# Patient Record
Sex: Female | Born: 1980
Health system: Southern US, Community
[De-identification: ages and names within clinical notes are randomized; demographics above are authoritative.]

## PROBLEM LIST (undated history)

## (undated) DIAGNOSIS — F419 Anxiety disorder, unspecified: Secondary | ICD-10-CM

## (undated) DIAGNOSIS — J309 Allergic rhinitis, unspecified: Secondary | ICD-10-CM

## (undated) DIAGNOSIS — G4700 Insomnia, unspecified: Secondary | ICD-10-CM

## (undated) DIAGNOSIS — K589 Irritable bowel syndrome without diarrhea: Secondary | ICD-10-CM

## (undated) DIAGNOSIS — J45909 Unspecified asthma, uncomplicated: Secondary | ICD-10-CM

## (undated) DIAGNOSIS — K219 Gastro-esophageal reflux disease without esophagitis: Secondary | ICD-10-CM

## (undated) DIAGNOSIS — T7819XA Other adverse food reactions, not elsewhere classified, initial encounter: Secondary | ICD-10-CM

## (undated) HISTORY — DX: Insomnia, unspecified: G47.00

## (undated) HISTORY — DX: Gastro-esophageal reflux disease without esophagitis: K21.9

## (undated) HISTORY — DX: Unspecified asthma, uncomplicated: J45.909

## (undated) HISTORY — DX: Anxiety disorder, unspecified: F41.9

## (undated) HISTORY — DX: Irritable bowel syndrome, unspecified: K58.9

## (undated) HISTORY — DX: Allergic rhinitis, unspecified: J30.9

## (undated) HISTORY — DX: Other adverse food reactions, not elsewhere classified, initial encounter: T78.19XA

---

## 2005-02-07 ENCOUNTER — Ambulatory Visit: Payer: Self-pay | Admitting: Internal Medicine

## 2005-03-25 ENCOUNTER — Ambulatory Visit: Payer: Self-pay | Admitting: Internal Medicine

## 2005-04-08 ENCOUNTER — Ambulatory Visit: Payer: Self-pay | Admitting: Internal Medicine

## 2005-04-17 ENCOUNTER — Ambulatory Visit: Payer: Self-pay | Admitting: Internal Medicine

## 2005-06-11 ENCOUNTER — Ambulatory Visit: Payer: Self-pay | Admitting: Internal Medicine

## 2005-09-19 ENCOUNTER — Ambulatory Visit: Payer: Self-pay | Admitting: Family Medicine

## 2005-11-13 ENCOUNTER — Ambulatory Visit: Payer: Self-pay | Admitting: Family Medicine

## 2005-12-20 ENCOUNTER — Ambulatory Visit: Payer: Self-pay | Admitting: Internal Medicine

## 2006-01-17 ENCOUNTER — Ambulatory Visit: Payer: Self-pay | Admitting: Gastroenterology

## 2006-02-26 ENCOUNTER — Ambulatory Visit: Payer: Self-pay | Admitting: Internal Medicine

## 2006-03-03 ENCOUNTER — Ambulatory Visit: Payer: Self-pay | Admitting: Internal Medicine

## 2006-04-14 ENCOUNTER — Encounter: Payer: Self-pay | Admitting: Internal Medicine

## 2006-04-14 DIAGNOSIS — K219 Gastro-esophageal reflux disease without esophagitis: Secondary | ICD-10-CM | POA: Insufficient documentation

## 2006-04-14 DIAGNOSIS — J309 Allergic rhinitis, unspecified: Secondary | ICD-10-CM | POA: Insufficient documentation

## 2006-04-14 DIAGNOSIS — J45909 Unspecified asthma, uncomplicated: Secondary | ICD-10-CM | POA: Insufficient documentation

## 2006-04-21 ENCOUNTER — Ambulatory Visit: Payer: Self-pay | Admitting: Family Medicine

## 2006-04-21 LAB — CONVERTED CEMR LAB: Herpes Simplex Vrs I&II-IgM Ab (EIA): DETECTED — AB

## 2007-01-21 ENCOUNTER — Telehealth (INDEPENDENT_AMBULATORY_CARE_PROVIDER_SITE_OTHER): Payer: Self-pay | Admitting: *Deleted

## 2008-03-15 ENCOUNTER — Emergency Department (HOSPITAL_COMMUNITY): Admission: EM | Admit: 2008-03-15 | Discharge: 2008-03-15 | Payer: Self-pay | Admitting: Emergency Medicine

## 2009-03-14 ENCOUNTER — Other Ambulatory Visit: Admission: RE | Admit: 2009-03-14 | Discharge: 2009-03-14 | Payer: Self-pay | Admitting: Obstetrics and Gynecology

## 2009-03-21 ENCOUNTER — Encounter: Admission: RE | Admit: 2009-03-21 | Discharge: 2009-03-21 | Payer: Self-pay | Admitting: *Deleted

## 2010-06-01 NOTE — Assessment & Plan Note (Signed)
[facility]                         GASTROENTEROLOGY OFFICE NOTE   NAME:Angela Bryant, Angela Bryant                   MRN:          [id number]  DATE:01/17/2006                            DOB:          May 05, 1980    REFERRING PHYSICIAN:  Karie Schwalbe, MD   REASON FOR REFERRAL:  Dr. Alphonsus Sias asked me to evaluate Ms. Angela Bryant in  consultation regarding acid reflux.   HISTORY OF PRESENT ILLNESS:  Angela Bryant is a pleasant 30 year old  woman who has had trouble for 1-2 years with burning in her upper  epigastrium. She says it is there several times a week. She has had 3 or  4 discreet severe attacks, twice at night where the burning would move  up into her chest and she had what sounds like water brash. Twice this  has happened during the day as well. She has been on PPI medicines for  the past year or two. Currently she is taking Protonix on an empty  stomach in the morning and does not eat anything for 2-3 hours at least.  She does believe that the PPIs do help although not completely. She has  not true dysphagia. She has rather chronic nausea that has been going on  for at least a year or so.   REVIEW OF SYSTEMS:  Notable for stable weight and is otherwise  essentially normal and is available on her nursing intake sheet.   PAST MEDICAL HISTORY:  Insomnia, asthma, probable GERD as above.   CURRENT MEDICATIONS:  1. Protonix 40 mg once daily.  2. Albuterol.  3. Advair.   ALLERGIES:  CODEINE.   SOCIAL HISTORY:  She is single, lives by herself, works for [employer].  Smokes once or twice a month, drinks once a week. She drinks 1-2 cups of  caffeine in a week. Eats dinner at 5 p.m., does not usually lay down for  bed until many hours later. Sometimes has a snack but this is at least 2-  3 hours before she lays down for bed. Does not eat chocolates or  peppermints.   FAMILY HISTORY:  No colon cancer or colitis in family.   PHYSICAL EXAMINATION:   VITAL SIGNS:  5 foot 6 inches, 139 pounds, blood  pressure 100/60, pulse 64.  CONSTITUTIONAL:  Generally well-appearing.  NEUROLOGIC:  Alert and oriented x3.  EYES:  Extraocular movements intact.  MOUTH:  Oropharynx moist, no lesions.  NECK:  Supple, no lymphadenopathy.  CARDIOVASCULAR:  Heart regular rate and rhythm.  LUNGS:  Clear to auscultation bilaterally.  ABDOMEN:  Soft, nontender, nondistended, normal bowel sounds.  EXTREMITIES:  No lower extremity edema.  SKIN:  No rashes or lesions on visible extremities.   ASSESSMENT/PLAN:  A 30 year old woman with likely gastroesophageal  reflux disease.   The epigastric burning, pyrosis in her chest and intermittent water  brash are rather classic symptoms for gastroesophageal reflux disease.  She does seem to be helped with proton pump inhibitor although she is  not taking it at the correct time in relation to food. Her symptoms on  proton pump inhibitor taken this way are incompletely treated. I  recommend that she change the way that she is taking Protonix and begin  taking it 20-30 minutes prior to a meal. She eats rather strangely. She  eats almost nothing for breakfast or lunch and has a very large dinner  around 5. I think that probably it is best that she take the Protonix 20-  30 minutes prior to her dinner meal. I will also arrange for her to have  an EGD at her soonest convenience to make sure there is no acid related  changes to her esophagus including Barrett's esophagus. If her symptoms  are not improving while taking proton pump inhibitor at the correct time  in relation to food then may need to consider other possibilities.     Rachael Fee, MD  Electronically Signed    DPJ/MedQ  DD: 01/17/2006  DT: 01/17/2006  Job #: [id number]   cc:   Karie Schwalbe, MD

## 2011-06-20 ENCOUNTER — Telehealth: Payer: Self-pay | Admitting: Gastroenterology

## 2011-06-20 NOTE — Telephone Encounter (Signed)
Pt has been having abd pain and burning.  Pt last seen 2008 and was recommended to have EGD pt did not have this done.  Appt made for 07/02/11 new pt paperwork mailed.

## 2011-07-02 ENCOUNTER — Ambulatory Visit (INDEPENDENT_AMBULATORY_CARE_PROVIDER_SITE_OTHER): Payer: 59 | Admitting: Gastroenterology

## 2011-07-02 ENCOUNTER — Encounter: Payer: Self-pay | Admitting: Gastroenterology

## 2011-07-02 VITALS — BP 98/68 | HR 80 | Ht 66.0 in | Wt 150.2 lb

## 2011-07-02 DIAGNOSIS — K219 Gastro-esophageal reflux disease without esophagitis: Secondary | ICD-10-CM

## 2011-07-02 NOTE — Progress Notes (Signed)
HPI: This is a   pleasant 31 year old woman whom I last saw about 5 years ago. I recommended an EGD for her at that time for her GERD, dyspepsia but she did not go ahead with it.  She has had constant burning, pyrosis.  Takes omeprazole in AM, doesn't eat BF usually.  Also takes zantac in AM, forgets sometimes.  No dypshagia.  Her weight has not changed.  Cannot lose weight.   Smoke cig rarely.  Very rare etoh.  Drinks 1 caffinated be a day.  Not much chocolcate or peppermint.    Not worse at night.  Rare vomiting.  Getting worse in past month  She also has postprandial diarrhea almost every day.  Review of systems: Pertinent positive and negative review of systems were noted in the above HPI section. Complete review of systems was performed and was otherwise normal.    Past Medical History  Diagnosis Date  . Asthma   . GERD (gastroesophageal reflux disease)     History reviewed. No pertinent past surgical history.  Current Outpatient Prescriptions  Medication Sig Dispense Refill  . albuterol (PROVENTIL HFA;VENTOLIN HFA) 108 (90 BASE) MCG/ACT inhaler Inhale 2 puffs into the lungs every 6 (six) hours as needed.      . calcium carbonate (TUMS - DOSED IN MG ELEMENTAL CALCIUM) 500 MG chewable tablet Chew 1 tablet by mouth daily.      Marland Kitchen omeprazole (PRILOSEC OTC) 20 MG tablet Take 20 mg by mouth daily.      . ranitidine (ZANTAC) 150 MG tablet Take 150 mg by mouth 2 (two) times daily.        Allergies as of 07/02/2011 - Review Complete 07/02/2011  Allergen Reaction Noted  . Codeine Nausea And Vomiting 07/02/2011    Family History  Problem Relation Age of Onset  . Hypertension Father     History   Social History  . Marital Status: Single    Spouse Name: N/A    Number of Children: 0  . Years of Education: N/A   Occupational History  . t [employer]    Social History Main Topics  . Smoking status: Current Some Day Smoker    Types: Cigarettes  . Smokeless tobacco: Never Used    . Alcohol Use: Yes     occasional   . Drug Use: No  . Sexually Active: Not on file   Other Topics Concern  . Not on file   Social History Narrative  . No narrative on file       Physical Exam: BP 98/68  Pulse 80  Ht 5\' 6"  (1.676 m)  Wt 150 lb 4 oz (68.153 kg)  BMI 24.25 kg/m2  LMP 06/06/2011 Constitutional: generally well-appearing Psychiatric: alert and oriented x3 Eyes: extraocular movements intact Mouth: oral pharynx moist, no lesions Neck: supple no lymphadenopathy Cardiovascular: heart regular rate and rhythm Lungs: clear to auscultation bilaterally Abdomen: soft, nontender, nondistended, no obvious ascites, no peritoneal signs, normal bowel sounds Extremities: no lower extremity edema bilaterally Skin: no lesions on visible extremities    Assessment and plan: 32 y.o. female with  significant GERD-like symptoms, intermittent postprandial diarrhea  First I recommended an EGD like I have recommended 5 years ago but she was not interested. Instead I'm giving her samples of a proton pump inhibitor and I have instructed her to take these one pill every day shortly before a meal. She will continue on H2 blocker nightly at bedtime. She is going to call to report on her  upper and lower GI symptoms in 3-4 weeks and we will take it from there.

## 2011-07-02 NOTE — Patient Instructions (Addendum)
Samples of PPI given, take it 20-30 min before a meal (such as lunch meal).  Call Dr. Christella Hartigan' to report on your symptoms in 3-4 weeks. Take zantac at bedtime every night.

## 2011-07-03 ENCOUNTER — Telehealth: Payer: Self-pay | Admitting: Gastroenterology

## 2011-07-03 NOTE — Telephone Encounter (Signed)
Cannot give narcotic pain meds without having better idea what is going on.  Recommend tylenol 1gram twice daily or a single alleve once daily.  Can also offer levbid .0.375mg  twice daily antispasm medicine.  Disp 60, 2 refills. Thanks

## 2011-07-03 NOTE — Telephone Encounter (Signed)
Left message on machine to call back  

## 2011-07-03 NOTE — Telephone Encounter (Signed)
Pt is calling for pain medication.  She says she forgot to ask at her appt yesterday.  I looked at the office note and she did not mention any pain and cramping.  I advised her that Dr Christella Hartigan did recommend an EGD that she declined.  I advised I would ask Dr Christella Hartigan but he may not prescribe any pain meds with out following his recommendations.  Please advise

## 2011-07-04 NOTE — Telephone Encounter (Signed)
Left message on machine to call back regarding her question, she was advised on voice mail if she still has questions to call our office

## 2011-07-17 ENCOUNTER — Other Ambulatory Visit: Payer: Self-pay | Admitting: Obstetrics and Gynecology

## 2011-07-17 ENCOUNTER — Other Ambulatory Visit (HOSPITAL_COMMUNITY)
Admission: RE | Admit: 2011-07-17 | Discharge: 2011-07-17 | Disposition: A | Discharge status: Home / Self Care | Payer: 59 | Source: Ambulatory Visit | Attending: Obstetrics and Gynecology | Admitting: Obstetrics and Gynecology

## 2011-07-17 DIAGNOSIS — Z01419 Encounter for gynecological examination (general) (routine) without abnormal findings: Secondary | ICD-10-CM | POA: Insufficient documentation

## 2011-07-17 DIAGNOSIS — N760 Acute vaginitis: Secondary | ICD-10-CM | POA: Insufficient documentation

## 2011-07-17 DIAGNOSIS — Z113 Encounter for screening for infections with a predominantly sexual mode of transmission: Secondary | ICD-10-CM | POA: Insufficient documentation

## 2011-07-29 ENCOUNTER — Telehealth: Payer: Self-pay | Admitting: Gastroenterology

## 2011-07-29 MED ORDER — ESOMEPRAZOLE MAGNESIUM 40 MG PO CPDR: 40.0000 mg | DELAYED_RELEASE_CAPSULE | Freq: Every day | ORAL | Status: DC

## 2011-07-29 NOTE — Telephone Encounter (Signed)
lmom for pt that I ordered Nexium at [facility] on [address].

## 2011-08-01 ENCOUNTER — Telehealth: Payer: Self-pay | Admitting: Gastroenterology

## 2011-08-01 ENCOUNTER — Other Ambulatory Visit: Payer: Self-pay | Admitting: Gastroenterology

## 2011-08-01 NOTE — Telephone Encounter (Signed)
Nexium is excluded from Pt's Rx plan. Dr. Christella Hartigan needs to prescribe a different medication.

## 2011-08-01 NOTE — Telephone Encounter (Signed)
Pt's nexium is requiring a prior auth. Called [facility] to start prior auth

## 2011-08-04 NOTE — Telephone Encounter (Signed)
Can you please call in: pantoprazole 40mg  one pill once daily, 20-30 min prior to BF meal daily, disp 30 with 6 refills.

## 2011-08-05 ENCOUNTER — Other Ambulatory Visit: Payer: Self-pay | Admitting: Gastroenterology

## 2011-08-05 MED ORDER — PANTOPRAZOLE SODIUM 40 MG PO TBEC: 40.0000 mg | DELAYED_RELEASE_TABLET | Freq: Every day | ORAL | Status: DC

## 2011-08-05 NOTE — Addendum Note (Signed)
Addended by: Donata Duff on: 08/05/2011 08:24 AM   Modules accepted: Orders

## 2011-08-08 ENCOUNTER — Other Ambulatory Visit (HOSPITAL_COMMUNITY)
Admission: RE | Admit: 2011-08-08 | Discharge: 2011-08-08 | Disposition: A | Discharge status: Home / Self Care | Payer: 59 | Source: Ambulatory Visit | Attending: Obstetrics and Gynecology | Admitting: Obstetrics and Gynecology

## 2011-08-29 ENCOUNTER — Telehealth: Payer: Self-pay | Admitting: Gastroenterology

## 2011-08-29 NOTE — Telephone Encounter (Signed)
Called pharmacy to see about prior auth for Nexium, according to pt's med list she is on protonix. [facility] said she tried to get Nexium months ago, and her Prior Berkley Harvey was denied and pt was put on Protonix and had that filled on 08/04/2011. Pharmacy has NO record of a new prior auth needed for pt. They will fill her protonix.

## 2012-03-19 ENCOUNTER — Encounter (HOSPITAL_COMMUNITY): Payer: Self-pay | Admitting: Emergency Medicine

## 2012-03-19 ENCOUNTER — Emergency Department (HOSPITAL_COMMUNITY)
Admission: EM | Admit: 2012-03-19 | Discharge: 2012-03-19 | Disposition: A | Discharge status: Home / Self Care | Payer: 59 | Attending: Emergency Medicine | Admitting: Emergency Medicine

## 2012-03-19 DIAGNOSIS — D72829 Elevated white blood cell count, unspecified: Secondary | ICD-10-CM | POA: Insufficient documentation

## 2012-03-19 DIAGNOSIS — Z8719 Personal history of other diseases of the digestive system: Secondary | ICD-10-CM | POA: Insufficient documentation

## 2012-03-19 DIAGNOSIS — E860 Dehydration: Secondary | ICD-10-CM | POA: Insufficient documentation

## 2012-03-19 DIAGNOSIS — R42 Dizziness and giddiness: Secondary | ICD-10-CM | POA: Insufficient documentation

## 2012-03-19 DIAGNOSIS — Z79899 Other long term (current) drug therapy: Secondary | ICD-10-CM | POA: Insufficient documentation

## 2012-03-19 DIAGNOSIS — R112 Nausea with vomiting, unspecified: Secondary | ICD-10-CM | POA: Insufficient documentation

## 2012-03-19 DIAGNOSIS — F17200 Nicotine dependence, unspecified, uncomplicated: Secondary | ICD-10-CM | POA: Insufficient documentation

## 2012-03-19 DIAGNOSIS — R197 Diarrhea, unspecified: Secondary | ICD-10-CM | POA: Insufficient documentation

## 2012-03-19 DIAGNOSIS — R1110 Vomiting, unspecified: Secondary | ICD-10-CM

## 2012-03-19 DIAGNOSIS — J45909 Unspecified asthma, uncomplicated: Secondary | ICD-10-CM | POA: Insufficient documentation

## 2012-03-19 DIAGNOSIS — N390 Urinary tract infection, site not specified: Secondary | ICD-10-CM | POA: Insufficient documentation

## 2012-03-19 LAB — COMPREHENSIVE METABOLIC PANEL
ALT: 14 U/L (ref 0–35)
AST: 18 U/L (ref 0–37)
AST: 34 U/L (ref 0–37)
Alkaline Phosphatase: 62 U/L (ref 39–117)
BUN: 11 mg/dL (ref 6–23)
CO2: 21 mEq/L (ref 19–32)
CO2: 21 mEq/L (ref 19–32)
Calcium: 10.2 mg/dL (ref 8.4–10.5)
Calcium: 9.9 mg/dL (ref 8.4–10.5)
Chloride: 99 mEq/L (ref 96–112)
Creatinine, Ser: 0.73 mg/dL (ref 0.50–1.10)
GFR calc non Af Amer: >90 mL/min (ref >90)
Glucose, Bld: 131 mg/dL — ABNORMAL HIGH (ref 70–99)
Potassium: 4 mEq/L (ref 3.5–5.1)
Potassium: 4.8 mEq/L (ref 3.5–5.1)
Sodium: 135 mEq/L (ref 135–145)
Sodium: 133 mEq/L — ABNORMAL LOW (ref 135–145)
Total Bilirubin: 0.4 mg/dL (ref 0.3–1.2)
Total Protein: 9.2 g/dL — ABNORMAL HIGH (ref 6.0–8.3)

## 2012-03-19 LAB — CBC
HCT: 42.1 % (ref 36.0–46.0)
Hemoglobin: 14.8 g/dL (ref 12.0–15.0)
MCH: 30.6 pg (ref 26.0–34.0)
MCHC: 35.2 g/dL (ref 30.0–36.0)
RBC: 4.84 MIL/uL (ref 3.87–5.11)
RDW: 13.1 % (ref 11.5–15.5)

## 2012-03-19 LAB — URINALYSIS, ROUTINE W REFLEX MICROSCOPIC
Glucose, UA: NEGATIVE mg/dL
Hgb urine dipstick: NEGATIVE
Ketones, ur: >80 mg/dL — AB
Nitrite: NEGATIVE
Protein, ur: 30 mg/dL — AB
Specific Gravity, Urine: 1.034 — ABNORMAL HIGH (ref 1.005–1.030)
Urobilinogen, UA: 0.2 mg/dL (ref 0.0–1.0)
pH: 5.5 (ref 5.0–8.0)

## 2012-03-19 LAB — POCT I-STAT, CHEM 8
Chloride: 108 mEq/L (ref 96–112)
Hemoglobin: 15.6 g/dL — ABNORMAL HIGH (ref 12.0–15.0)
Sodium: 139 mEq/L (ref 135–145)
TCO2: 25 mmol/L (ref 0–100)

## 2012-03-19 LAB — CBC WITH DIFFERENTIAL/PLATELET
Basophils Absolute: 0 10*3/uL (ref 0.0–0.1)
Hemoglobin: 14.8 g/dL (ref 12.0–15.0)
MCH: 31.2 pg (ref 26.0–34.0)
Monocytes Absolute: 0.8 10*3/uL (ref 0.1–1.0)
Monocytes Relative: 4 % (ref 3–12)
Neutro Abs: 17.7 10*3/uL — ABNORMAL HIGH (ref 1.7–7.7)

## 2012-03-19 LAB — URINE MICROSCOPIC-ADD ON

## 2012-03-19 MED ORDER — CEFTRIAXONE SODIUM 1 G IJ SOLR
1.0000 g | Freq: Once | INTRAMUSCULAR | Status: AC
  Administered 2012-03-19: 1 g via INTRAMUSCULAR
  Filled 2012-03-19: qty 10

## 2012-03-19 MED ORDER — CEFPODOXIME PROXETIL 100 MG PO TABS: 100.0000 mg | ORAL_TABLET | Freq: Two times a day (BID) | ORAL | Status: DC

## 2012-03-19 MED ORDER — LIDOCAINE HCL (PF) 1 % IJ SOLN
INTRAMUSCULAR | Status: AC
  Administered 2012-03-19: 2 mL
  Filled 2012-03-19: qty 5

## 2012-03-19 MED ORDER — PROMETHAZINE HCL 25 MG PO TABS: 25.0000 mg | ORAL_TABLET | Freq: Four times a day (QID) | ORAL | Status: DC | PRN

## 2012-03-19 MED ORDER — SODIUM CHLORIDE 0.9 % IV BOLUS (SEPSIS)
1000.0000 mL | Freq: Once | INTRAVENOUS | Status: AC
  Administered 2012-03-19: 1000 mL via INTRAVENOUS

## 2012-03-19 NOTE — ED Notes (Signed)
PT. REPORTS EMESIS AND DIARRHEA WITH UPPER ABDOMINAL PAIN AND  DIZZINESS/ LIGHTHEADEDNESS ONSET YESTERDAY .

## 2012-03-20 LAB — URINE CULTURE: Colony Count: NO GROWTH

## 2012-03-20 NOTE — ED Provider Notes (Signed)
History     CSN: [id number]  Arrival date & time 03/19/12  0032   First MD Initiated Contact with Patient 03/19/12 0214      Chief Complaint  Patient presents with  . Diarrhea  . Emesis    (Consider location/radiation/quality/duration/timing/severity/associated sxs/prior treatment) HPI History provided by patient. Yesterday onset of nausea vomiting diarrhea with associated dizziness and lightheadedness. No fevers. No known sick contacts. No blood in emesis or stools. Has associated intermittent abdominal cramping - no significant pain at this time. No back pain or dysuria. Symptoms moderate in severity. No fevers or chills Past Medical History  Diagnosis Date  . Asthma   . GERD (gastroesophageal reflux disease)     History reviewed. No pertinent past surgical history.  Family History  Problem Relation Age of Onset  . Hypertension Father     History  Substance Use Topics  . Smoking status: Current Some Day Smoker    Types: Cigarettes  . Smokeless tobacco: Never Used  . Alcohol Use: Yes     Comment: occasional     OB History   Grav Para Term Preterm Abortions TAB SAB Ect Mult Living                  Review of Systems  Constitutional: Negative for fever and chills.  HENT: Negative for neck pain and neck stiffness.   Eyes: Negative for pain.  Respiratory: Negative for shortness of breath.   Cardiovascular: Negative for chest pain.  Gastrointestinal: Positive for nausea, vomiting and diarrhea. Negative for blood in stool and abdominal distention.  Genitourinary: Negative for dysuria.  Musculoskeletal: Negative for back pain.  Skin: Negative for rash.  Neurological: Negative for headaches.  All other systems reviewed and are negative.    Allergies  Codeine  Home Medications   Current Outpatient Rx  Name  Route  Sig  Dispense  Refill  . albuterol (PROVENTIL HFA;VENTOLIN HFA) 108 (90 BASE) MCG/ACT inhaler   Inhalation   Inhale 2 puffs into the lungs every  6 (six) hours as needed.         Marland Kitchen ZOLPIDEM TARTRATE PO   Oral   Take by mouth.         . cefpodoxime (VANTIN) 100 MG tablet   Oral   Take 1 tablet (100 mg total) by mouth 2 (two) times daily.   14 tablet   0   . promethazine (PHENERGAN) 25 MG tablet   Oral   Take 1 tablet (25 mg total) by mouth every 6 (six) hours as needed for nausea.   30 tablet   0     BP 121/75  Pulse 104  Temp(Src) 98.1 F (36.7 C) (Oral)  Resp 20  SpO2 100%  LMP 02/27/2012  Physical Exam  Constitutional: She is oriented to person, place, and time. She appears well-developed and well-nourished.  HENT:  Head: Normocephalic and atraumatic.  Eyes: EOM are normal. Pupils are equal, round, and reactive to light.  Neck: Neck supple.  Cardiovascular: Regular rhythm and intact distal pulses.   Tachycardic  Pulmonary/Chest: Effort normal and breath sounds normal. No respiratory distress. She exhibits no tenderness.  Abdominal: Soft. Bowel sounds are normal. She exhibits no distension. There is no tenderness. There is no rebound and no guarding.  No CVA tenderness  Musculoskeletal: Normal range of motion. She exhibits no edema.  Neurological: She is alert and oriented to person, place, and time.  Skin: Skin is warm and dry.    ED Course  Procedures (including critical care time)  Labs Reviewed  CBC WITH DIFFERENTIAL - Abnormal; Notable for the following:    WBC 19.2 (*)    MCHC 36.1 (*)    Neutrophils Relative 92 (*)    Neutro Abs 17.7 (*)    Lymphocytes Relative 3 (*)    Lymphs Abs 0.6 (*)    All other components within normal limits  COMPREHENSIVE METABOLIC PANEL - Abnormal; Notable for the following:    Glucose, Bld 146 (*)    Total Protein 8.8 (*)    All other components within normal limits  URINALYSIS, ROUTINE W REFLEX MICROSCOPIC - Abnormal; Notable for the following:    Color, Urine AMBER (*)    APPearance TURBID (*)    Specific Gravity, Urine 1.034 (*)    Bilirubin Urine SMALL  (*)    Ketones, ur >80 (*)    Protein, ur 30 (*)    Leukocytes, UA SMALL (*)    All other components within normal limits  CBC - Abnormal; Notable for the following:    WBC 19.9 (*)    All other components within normal limits  COMPREHENSIVE METABOLIC PANEL - Abnormal; Notable for the following:    Sodium 133 (*)    Glucose, Bld 131 (*)    Total Protein 9.2 (*)    All other components within normal limits  URINE MICROSCOPIC-ADD ON - Abnormal; Notable for the following:    Squamous Epithelial / LPF MANY (*)    Bacteria, UA MANY (*)    All other components within normal limits  POCT I-STAT, CHEM 8 - Abnormal; Notable for the following:    Potassium 5.4 (*)    Glucose, Bld 136 (*)    Hemoglobin 15.6 (*)    All other components within normal limits  URINE CULTURE     1. Vomiting and diarrhea   2. UTI (lower urinary tract infection)   3. Dehydration   4. Elevated white blood cell count    IV fluids. IV Zofran.  On recheck, patient is feeling better and requesting to be discharged home. She is notified of her elevated white blood cell count and concern for possible serious infection.  She agrees to IM antibiotics and strict return precautions and very much feels she is improved and wants to go home. She tolerates oral fluids and has no significant abdominal pain in the emergency department  MDM  Nausea vomiting diarrhea with leukocytosis, severe dehydration/ tachycardia, and UTI.  Evaluated with urinalysis and labs reviewed as above  Improved with IV fluids and IV Zofran  Vital signs and nursing notes reviewed and considered - patient is reliable historian and states understanding all discharge and followup instructions in addition to provided ED return precautions        Sunnie Nielsen, MD 03/20/12 0510

## 2012-03-31 ENCOUNTER — Other Ambulatory Visit: Payer: Self-pay | Admitting: Neurology

## 2012-04-22 ENCOUNTER — Other Ambulatory Visit: Payer: Self-pay | Admitting: Neurology

## 2012-10-05 ENCOUNTER — Other Ambulatory Visit: Payer: Self-pay | Admitting: Neurology

## 2012-10-05 NOTE — Telephone Encounter (Signed)
Rx signed and faxed.

## 2012-12-21 ENCOUNTER — Telehealth: Payer: Self-pay | Admitting: Gastroenterology

## 2012-12-21 NOTE — Telephone Encounter (Signed)
Pt has been given an appt with Angela Bryant for 12/23/12.  She has a change in bowels and abd pain with rectal bleeding

## 2012-12-23 ENCOUNTER — Ambulatory Visit (INDEPENDENT_AMBULATORY_CARE_PROVIDER_SITE_OTHER): Payer: 59 | Admitting: Physician Assistant

## 2012-12-23 ENCOUNTER — Encounter: Payer: Self-pay | Admitting: Physician Assistant

## 2012-12-23 ENCOUNTER — Other Ambulatory Visit (INDEPENDENT_AMBULATORY_CARE_PROVIDER_SITE_OTHER): Payer: 59

## 2012-12-23 VITALS — BP 102/70 | HR 92 | Ht 65.5 in | Wt 153.4 lb

## 2012-12-23 DIAGNOSIS — R198 Other specified symptoms and signs involving the digestive system and abdomen: Secondary | ICD-10-CM

## 2012-12-23 DIAGNOSIS — R194 Change in bowel habit: Secondary | ICD-10-CM

## 2012-12-23 DIAGNOSIS — K625 Hemorrhage of anus and rectum: Secondary | ICD-10-CM

## 2012-12-23 LAB — CBC WITH DIFFERENTIAL/PLATELET
Basophils Absolute: 0 10*3/uL (ref 0.0–0.1)
Basophils Relative: 0.5 % (ref 0.0–3.0)
Eosinophils Absolute: 0.2 10*3/uL (ref 0.0–0.7)
Eosinophils Relative: 3 % (ref 0.0–5.0)
HCT: 36.8 % (ref 36.0–46.0)
Lymphocytes Relative: 25.5 % (ref 12.0–46.0)
MCHC: 33.5 g/dL (ref 30.0–36.0)
Monocytes Relative: 7.2 % (ref 3.0–12.0)
Neutro Abs: 5.2 10*3/uL (ref 1.4–7.7)
Neutrophils Relative %: 63.8 % (ref 43.0–77.0)
RBC: 4.21 Mil/uL (ref 3.87–5.11)
WBC: 8.1 10*3/uL (ref 4.5–10.5)

## 2012-12-23 LAB — TSH: TSH: 0.85 u[IU]/mL (ref 0.35–5.50)

## 2012-12-23 LAB — HIGH SENSITIVITY CRP: CRP, High Sensitivity: 5.01 mg/L — ABNORMAL HIGH (ref 0.000–5.000)

## 2012-12-23 LAB — SEDIMENTATION RATE: Sed Rate: 27 mm/hr — ABNORMAL HIGH (ref 0–22)

## 2012-12-23 NOTE — Progress Notes (Signed)
Subjective:    Patient ID: Angela Bryant, female    DOB: 05/23/1980, 32 y.o.   MRN: [id number]  HPI Angela Bryant is a pleasant 32 year old Caucasian female known to Dr. Christella Bryant who had been seen previously for GERD. She comes in now with new complaint of constipation over the past 6 months.she states she had had long-term symptoms of postprandial diarrhea for many years and always thought she had IBS. She mentions that her father and her brother have IBS and her half brother also has celiac disease. She says about 6 months ago she started having more problems with constipation. She's not been on any new diet supplements or medications. He says she'll still have episodes of diarrhea where she may have 4-5 bowel movements per day- liquid at times-then go back to constipation and have no bowel movement for for 5 days. He's had some mild lower, cramping intermittently. Her appetite has been fine her weight has been stable. Over the past 4-5 days she did have constipation and had a lot of straining and then passed some bright red blood which she saw on the tissue and in the commode. She says in the past on a couple of occasions she had seen scant amounts of bright red blood which she had attributed to  A hemorrhoid.. She states that she talked to a friend  friend recently who told her about Crohn's disease and now she's worried about possibility of inflammatory bowel disease.   Review of Systems  Constitutional: Negative.   HENT: Negative.   Eyes: Negative.   Respiratory: Negative.   Cardiovascular: Negative.   Gastrointestinal: Positive for abdominal pain, diarrhea, constipation and anal bleeding.  Endocrine: Negative.   Genitourinary: Negative.   Musculoskeletal: Negative.   Allergic/Immunologic: Negative.   Neurological: Negative.   Hematological: Negative.   Psychiatric/Behavioral: Negative.    Outpatient Prescriptions Prior to Visit  Medication Sig Dispense Refill  . albuterol (PROVENTIL  HFA;VENTOLIN HFA) 108 (90 BASE) MCG/ACT inhaler Inhale 2 puffs into the lungs every 6 (six) hours as needed.      . promethazine (PHENERGAN) 25 MG tablet Take 1 tablet (25 mg total) by mouth every 6 (six) hours as needed for nausea.  30 tablet  0  . zolpidem (AMBIEN) 10 MG tablet TAKE ONE TABLET BY MOUTH AT BEDTIME  30 tablet  5  . ZOLPIDEM TARTRATE PO Take by mouth.      . cefpodoxime (VANTIN) 100 MG tablet Take 1 tablet (100 mg total) by mouth 2 (two) times daily.  14 tablet  0   No facility-administered medications prior to visit.       Allergies  Allergen Reactions  . Codeine Nausea And Vomiting   Patient Active Problem List   Diagnosis Date Noted  . ALLERGIC RHINITIS 04/14/2006  . ASTHMA 04/14/2006  . GERD 04/14/2006   History  Substance Use Topics  . Smoking status: Former Smoker    Types: Cigarettes    Quit date: 12/09/2012  . Smokeless tobacco: Never Used  . Alcohol Use: Yes     Comment: occasional   family history includes Hypertension in her father.   Objective:   Physical Exam   And white female in no acute distress, pleasant blood pressure 102/70 pulse 92-5 foot 5 weight 153. HEENT; nontraumatic normocephalic EOMI PERRLA sclera anicteric, Supple; no JVD, Cardiovascular; regular rate and rhythm with S1-S2 no murmur or gallop, Pulmonary; clear bilaterally, Abdomen; soft basically nontender there's no guarding or rebound no palpable mass or hepatosplenomegaly bowel  sounds are present, Rectal; exam on external exam she is very small tag digital exam is negative and stool is Hemoccult negative, Extremities; no clubbing cyanosis or edema skin warm and dry, Psych ;mood and affect normal and appropriate       Assessment & Plan:  #31 32 year old female with long history of intermittent bouts of diarrhea now with constipation alternating with diarrhea x6 months and 2 isolated incidence of small volume hematochezia. I suspect she may have IBS with bleeding secondary to  internal hemorrhoid no evidence of fissure on rectal exam. Also need to consider celiac disease and inflammatory bowel disease . #2 GERD-currently not problematic  Plan; start Benefiber one dose every morning Start probiotic daily. Patient was given samples today of Restora Will check CBC with differential, CRP, and celiac panel as well as TSH Will  followup with Dr. Christella Bryant or myself in one month and is encouraged: The interim if the above regimen is not helping

## 2012-12-23 NOTE — Patient Instructions (Signed)
Please go to the basement level to have your labs drawn.  Take benefiber daily. Take Probiotic daily, Restora over the counter. Samples provided.  We made you a follow up appointment with Dr. Christella Hartigan 02-03-2013 at 9:45 am.

## 2012-12-24 LAB — CELIAC PANEL 10
Gliadin IgA: 7.5 U/mL (ref <20)
Gliadin IgG: 8.5 U/mL (ref <20)
Tissue Transglut Ab: 7.5 U/mL (ref <20)
Tissue Transglutaminase Ab, IgA: 6.9 U/mL (ref <20)

## 2012-12-28 NOTE — Progress Notes (Signed)
i agree with the above workup, plan

## 2013-01-01 ENCOUNTER — Telehealth: Payer: Self-pay | Admitting: Gastroenterology

## 2013-01-04 NOTE — Telephone Encounter (Signed)
Pt has been calling for lab results since 01/01/13. Please advise.

## 2013-01-05 NOTE — Telephone Encounter (Signed)
Please let pt know labs are normal with exception of mildy elevatedinflammatory marker. Celiac blood work negative- make sure she has follow up with jacobs scheduled

## 2013-01-06 NOTE — Telephone Encounter (Signed)
I called the patient and advised her of the results per Amy Esterwood PA-C . I advised the patient she has an apppointment with Dr. Rob Bunting on 02-03-2013 at 9:45 am.  She seemed frustrated and asked about labs for IBS. I told her the labs Amy had drawn were labs done for IBS and other conditions.  I told her to continue the probiotic.  I also gave her suggestions that Amy gave her when she saw her recently.  I told her to call me Monday the 29th and I will check the schedule to see if Dr. Christella Hartigan had any sooner appointment in early Jan. 2015.

## 2013-02-03 ENCOUNTER — Encounter: Payer: Self-pay | Admitting: Gastroenterology

## 2013-02-03 ENCOUNTER — Ambulatory Visit (INDEPENDENT_AMBULATORY_CARE_PROVIDER_SITE_OTHER): Payer: 59 | Admitting: Gastroenterology

## 2013-02-03 VITALS — BP 104/76 | HR 72 | Ht 65.5 in | Wt 153.4 lb

## 2013-02-03 DIAGNOSIS — K625 Hemorrhage of anus and rectum: Secondary | ICD-10-CM

## 2013-02-03 DIAGNOSIS — R109 Unspecified abdominal pain: Secondary | ICD-10-CM

## 2013-02-03 DIAGNOSIS — K5900 Constipation, unspecified: Secondary | ICD-10-CM

## 2013-02-03 MED ORDER — MOVIPREP 100 G PO SOLR: 1.0000 | Freq: Once | ORAL | Status: DC

## 2013-02-03 NOTE — Progress Notes (Signed)
Review of pertinent gastrointestinal problems: 1. Dyspepsia, GERD-like. I have offered upper endoscopy to her twice in the past 5 to 6 years she has declined twice.   HPI: This is a  pleasant 33 year old woman whom I last saw a year or so ago. More recently she saw Mike GipAmy Esterwood here in our office, this was for what sounded like alternating constipation diarrhea, minor rectal bleeding.  Has been very constipated lately.  We gave probiotics, recommended benefiber.  She's been eating   Goes only once every week.   When she goes she has to push and strain quite a lot.  Previously would have loose stools alternating with constipation.  She tried miralax only for three days.  No weight change   Past Medical History  Diagnosis Date  . Asthma   . GERD (gastroesophageal reflux disease)     History reviewed. No pertinent past surgical history.  Current Outpatient Prescriptions  Medication Sig Dispense Refill  . ADVAIR DISKUS 250-50 MCG/DOSE AEPB       . albuterol (PROVENTIL HFA;VENTOLIN HFA) 108 (90 BASE) MCG/ACT inhaler Inhale 2 puffs into the lungs every 6 (six) hours as needed.      . ALPRAZolam (XANAX) 1 MG tablet       . cyclobenzaprine (FLEXERIL) 5 MG tablet       . fluconazole (DIFLUCAN) 150 MG tablet       . promethazine (PHENERGAN) 25 MG tablet Take 1 tablet (25 mg total) by mouth every 6 (six) hours as needed for nausea.  30 tablet  0  . zolpidem (AMBIEN) 10 MG tablet TAKE ONE TABLET BY MOUTH AT BEDTIME  30 tablet  5   No current facility-administered medications for this visit.    Allergies as of 02/03/2013 - Review Complete 02/03/2013  Allergen Reaction Noted  . Codeine Nausea And Vomiting 07/02/2011    Family History  Problem Relation Age of Onset  . Hypertension Father     History   Social History  . Marital Status: Single    Spouse Name: N/A    Number of Children: 0  . 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 Smoking status: Former Smoker    Types: Cigarettes    Quit date: 12/09/2012  . Smokeless tobacco: Never Used  . Alcohol Use: Yes     Comment: occasional   . Drug Use: No  . Sexual Activity: Not on file   Other Topics Concern  . Not on file   Social History Narrative  . No narrative on file      Physical Exam: BP 104/76  Pulse 72  Ht 5' 5.5" (1.664 m)  Wt 153 lb 6.4 oz (69.582 kg)  BMI 25.13 kg/m2  LMP 02/03/2013 Constitutional: generally well-appearing Psychiatric: alert and oriented x3 Abdomen: soft, nontender, nondistended, no obvious ascites, no peritoneal signs, normal bowel sounds     Assessment and plan: 33 y.o. female with significant change in her bowels recently, constipation, minor rectal bleeding  I did not mention above that she did have labs done last month and these showed elevated inflammatory markers including an elevated sedimentation rate and elevated CRP. I recommended she start taking daily MiraLax instead of as needed. I also recommended we proceed with colonoscopy to workup her change in bowels, minor rectal bleeding.

## 2013-02-03 NOTE — Patient Instructions (Signed)
Start once daily miralax, one dose every day. You will be set up for a colonoscopy (LEC, MAC sedation).

## 2013-02-13 ENCOUNTER — Other Ambulatory Visit: Payer: Self-pay | Admitting: Neurology

## 2013-03-01 ENCOUNTER — Encounter: Payer: Self-pay | Admitting: Gastroenterology

## 2013-03-01 ENCOUNTER — Ambulatory Visit (AMBULATORY_SURGERY_CENTER): Payer: 59 | Admitting: Gastroenterology

## 2013-03-01 VITALS — BP 111/70 | HR 71 | Temp 98.9°F | Resp 27 | Ht 65.0 in | Wt 153.0 lb

## 2013-03-01 DIAGNOSIS — K5900 Constipation, unspecified: Secondary | ICD-10-CM

## 2013-03-01 DIAGNOSIS — R109 Unspecified abdominal pain: Secondary | ICD-10-CM

## 2013-03-01 MED ORDER — SODIUM CHLORIDE 0.9 % IV SOLN: 500.0000 mL | INTRAVENOUS | Status: DC

## 2013-03-01 NOTE — Op Note (Signed)
[facility] [address] [place], [id number]   COLONOSCOPY PROCEDURE REPORT  PATIENT: Angela Bryant, Angela Bryant  MR#: [id number] BIRTHDATE: [birth year] , 33  yrs. old GENDER: Female ENDOSCOPIST: Rachael Feeaniel P Lanitra Battaglini, MD PROCEDURE DATE:  03/01/2013 PROCEDURE:   Colonoscopy, diagnostic First Screening Colonoscopy - Avg.  risk and is 50 yrs.  old or older - No.  Prior Negative Screening - Now for repeat screening. N/A  History of Adenoma - Now for follow-up colonoscopy & has been > or = to 3 yrs.  N/A  Polyps Removed Today? No.  Recommend repeat exam, <10 yrs? No. ASA CLASS:   Class I INDICATIONS:change in bowels, constipation, minor rectal bleeding.  MEDICATIONS: propofol (Diprivan) 400mg  IV  DESCRIPTION OF PROCEDURE:   After the risks benefits and alternatives of the procedure were thoroughly explained, informed consent was obtained.  A digital rectal exam revealed no abnormalities of the rectum.   The LB PFC-H190 [id number]  endoscope was introduced through the anus and advanced to the terminal ileum which was intubated for a short distance. No adverse events experienced.   The quality of the prep was excellent.  The instrument was then slowly withdrawn as the colon was fully examined.   COLON FINDINGS: The mucosa appeared normal in the terminal ileum. A normal appearing cecum, ileocecal valve, and appendiceal orifice were identified.  The ascending, hepatic flexure, transverse, splenic flexure, descending, sigmoid colon and rectum appeared unremarkable.  No polyps or cancers were seen.  Retroflexed views revealed no abnormalities. The time to cecum=4 minutes 00 seconds. Withdrawal time=5 minutes 31 seconds.  The scope was withdrawn and the procedure completed. COMPLICATIONS: There were no complications.  ENDOSCOPIC IMPRESSION: Normal mucosa in the terminal ileum Normal colon You minor intermittent bleeding is likely from  intermittent hemorrhoids.  RECOMMENDATIONS: Trial of (low dose) Linzess for your constipation.  Please call Dr. Christella HartiganJacobs' office in 7-10 days to report on your response.  eSigned:  Rachael Feeaniel P Lorena Clearman, MD 03/01/2013 3:32 PM

## 2013-03-01 NOTE — Progress Notes (Signed)
Procedure ends, to recovery, report given and VSS. 

## 2013-03-01 NOTE — Patient Instructions (Addendum)
YOU HAD AN ENDOSCOPIC PROCEDURE TODAY AT [facility]: Refer to the procedure report that was given to you for any specific questions about what was found during the examination.  If the procedure report does not answer your questions, please call your gastroenterologist to clarify.  If you requested that your care partner not be given the details of your procedure findings, then the procedure report has been included in a sealed envelope for you to review at your convenience later.  YOU SHOULD EXPECT: Some feelings of bloating in the abdomen. Passage of more gas than usual.  Walking can help get rid of the air that was put into your GI tract during the procedure and reduce the bloating. If you had a lower endoscopy (such as a colonoscopy or flexible sigmoidoscopy) you may notice spotting of blood in your stool or on the toilet paper. If you underwent a bowel prep for your procedure, then you may not have a normal bowel movement for a few days.  DIET: Your first meal following the procedure should be a light meal and then it is ok to progress to your normal diet.  A half-sandwich or bowl of soup is an example of a good first meal.  Heavy or fried foods are harder to digest and may make you feel nauseous or bloated.  Likewise meals heavy in dairy and vegetables can cause extra gas to form and this can also increase the bloating.  Drink plenty of fluids but you should avoid alcoholic beverages for 24 hours.  ACTIVITY: Your care partner should take you home directly after the procedure.  You should plan to take it easy, moving slowly for the rest of the day.  You can resume normal activity the day after the procedure however you should NOT DRIVE or use heavy machinery for 24 hours (because of the sedation medicines used during the test).    SYMPTOMS TO REPORT IMMEDIATELY: A gastroenterologist can be reached at any hour.  During normal business hours, 8:30 AM to 5:00 PM Monday through Friday,  call [phone].  After hours and on weekends, please call the GI answering service at [phone] who will take a message and have the physician on call contact you.   Following lower endoscopy (colonoscopy or flexible sigmoidoscopy):  Excessive amounts of blood in the stool  Significant tenderness or worsening of abdominal pains  Swelling of the abdomen that is new, acute  Fever of 100F or higher   FOLLOW UP: If any biopsies were taken you will be contacted by phone or by letter within the next 1-3 weeks.  Call your gastroenterologist if you have not heard about the biopsies in 3 weeks.  Our staff will call the home number listed on your records the next business day following your procedure to check on you and address any questions or concerns that you may have at that time regarding the information given to you following your procedure. This is a courtesy call and so if there is no answer at the home number and we have not heard from you through the emergency physician on call, we will assume that you have returned to your regular daily activities without incident.  SIGNATURES/CONFIDENTIALITY: You and/or your care partner have signed paperwork which will be entered into your electronic medical record.  These signatures attest to the fact that that the information above on your After Visit Summary has been reviewed and is understood.  Full responsibility of the confidentiality of  this discharge information lies with you and/or your care-partner.  Normal exam  Trial of linzess ( low dose) for your constipation. Please call Dr Larae GroomsJacob's office at [phone] to report on your response.in 7 10 days.  Samples provided for now

## 2013-03-01 NOTE — Progress Notes (Signed)
Patient did not experience any of the following events: a burn prior to discharge; a fall within the facility; wrong site/side/patient/procedure/implant event; or a hospital transfer or hospital admission upon discharge from the facility. (G8907) Patient did not have preoperative order for IV antibiotic SSI prophylaxis. (G8918)  

## 2013-03-01 NOTE — Progress Notes (Signed)
Pt.is menstruating-tampon in place.

## 2013-03-02 ENCOUNTER — Encounter: Payer: 59 | Admitting: Gastroenterology

## 2013-03-02 ENCOUNTER — Telehealth: Payer: Self-pay | Admitting: *Deleted

## 2013-03-02 NOTE — Telephone Encounter (Signed)
  Follow up Call-  Call back number 03/01/2013  Post procedure Call Back phone  # [phone]  Permission to leave phone message Yes     Patient questions:  Do you have a fever, pain , or abdominal swelling? no Pain Score  0 *  Have you tolerated food without any problems? yes  Have you been able to return to your normal activities? yes  Do you have any questions about your discharge instructions: Diet   no Medications  no Follow up visit  no  Do you have questions or concerns about your Care? no  Actions: * If pain score is 4 or above: No action needed, pain <4.

## 2013-04-12 ENCOUNTER — Ambulatory Visit: Payer: 59 | Admitting: Family Medicine

## 2013-04-14 ENCOUNTER — Other Ambulatory Visit: Payer: Self-pay | Admitting: Neurology

## 2013-05-24 ENCOUNTER — Ambulatory Visit: Payer: 59 | Admitting: Family Medicine

## 2013-06-04 ENCOUNTER — Telehealth: Payer: Self-pay

## 2013-06-04 NOTE — Telephone Encounter (Signed)
Medication and allergies:  Reviewed and updated  90 day supply/mail order: n/a Local pharmacy:  [facility] - [place], [place] - [address]   Immunizations due:  UTD   A/P: No changes to personal, family history or past surgical hx PAP- 07/27/11-negative Tdap- 02/07/05  To Discuss with Provider: Nothing at this time.

## 2013-06-08 ENCOUNTER — Ambulatory Visit (INDEPENDENT_AMBULATORY_CARE_PROVIDER_SITE_OTHER): Payer: 59 | Admitting: Family Medicine

## 2013-06-08 ENCOUNTER — Telehealth: Payer: Self-pay | Admitting: Gastroenterology

## 2013-06-08 ENCOUNTER — Encounter: Payer: Self-pay | Admitting: Family Medicine

## 2013-06-08 VITALS — BP 112/78 | HR 77 | Temp 98.4°F | Ht 66.0 in | Wt 149.6 lb

## 2013-06-08 DIAGNOSIS — G4700 Insomnia, unspecified: Secondary | ICD-10-CM

## 2013-06-08 DIAGNOSIS — R51 Headache: Secondary | ICD-10-CM

## 2013-06-08 DIAGNOSIS — T23029A Burn of unspecified degree of unspecified single finger (nail) except thumb, initial encounter: Secondary | ICD-10-CM

## 2013-06-08 DIAGNOSIS — R519 Headache, unspecified: Secondary | ICD-10-CM

## 2013-06-08 MED ORDER — SILVER SULFADIAZINE 1 % EX CREA: 1.0000 "application " | TOPICAL_CREAM | Freq: Every day | CUTANEOUS | Status: DC

## 2013-06-08 MED ORDER — CYCLOBENZAPRINE HCL 5 MG PO TABS: 5.0000 mg | ORAL_TABLET | Freq: Three times a day (TID) | ORAL | Status: DC | PRN

## 2013-06-08 MED ORDER — LINACLOTIDE 145 MCG PO CAPS: 145.0000 ug | ORAL_CAPSULE | Freq: Every day | ORAL | Status: DC

## 2013-06-08 NOTE — Patient Instructions (Signed)
Migraine Headache A migraine headache is an intense, throbbing pain on one or both sides of your head. A migraine can last for 30 minutes to several hours. CAUSES  The exact cause of a migraine headache is not always known. However, a migraine may be caused when nerves in the brain become irritated and release chemicals that cause inflammation. This causes pain. Certain things may also trigger migraines, such as:  Alcohol.  Smoking.  Stress.  Menstruation.  Aged cheeses.  Foods or drinks that contain nitrates, glutamate, aspartame, or tyramine.  Lack of sleep.  Chocolate.  Caffeine.  Hunger.  Physical exertion.  Fatigue.  Medicines used to treat chest pain (nitroglycerine), birth control pills, estrogen, and some blood pressure medicines. SIGNS AND SYMPTOMS  Pain on one or both sides of your head.  Pulsating or throbbing pain.  Severe pain that prevents daily activities.  Pain that is aggravated by any physical activity.  Nausea, vomiting, or both.  Dizziness.  Pain with exposure to bright lights, loud noises, or activity.  General sensitivity to bright lights, loud noises, or smells. Before you get a migraine, you may get warning signs that a migraine is coming (aura). An aura may include:  Seeing flashing lights.  Seeing bright spots, halos, or zig-zag lines.  Having tunnel vision or blurred vision.  Having feelings of numbness or tingling.  Having trouble talking.  Having muscle weakness. DIAGNOSIS  A migraine headache is often diagnosed based on:  Symptoms.  Physical exam.  A CT scan or MRI of your head. These imaging tests cannot diagnose migraines, but they can help rule out other causes of headaches. TREATMENT Medicines may be given for pain and nausea. Medicines can also be given to help prevent recurrent migraines.  HOME CARE INSTRUCTIONS  Only take over-the-counter or prescription medicines for pain or discomfort as directed by your  health care provider. The use of long-term narcotics is not recommended.  Lie down in a dark, quiet room when you have a migraine.  Keep a journal to find out what may trigger your migraine headaches. For example, write down:  What you eat and drink.  How much sleep you get.  Any change to your diet or medicines.  Limit alcohol consumption.  Quit smoking if you smoke.  Get 7 9 hours of sleep, or as recommended by your health care provider.  Limit stress.  Keep lights dim if bright lights bother you and make your migraines worse. SEEK IMMEDIATE MEDICAL CARE IF:   Your migraine becomes severe.  You have a fever.  You have a stiff neck.  You have vision loss.  You have muscular weakness or loss of muscle control.  You start losing your balance or have trouble walking.  You feel faint or pass out.  You have severe symptoms that are different from your first symptoms. MAKE SURE YOU:   Understand these instructions.  Will watch your condition.  Will get help right away if you are not doing well or get worse. Document Released: 12/31/2004 Document Revised: 10/21/2012 Document Reviewed: 09/07/2012 ExitCare Patient Information 2014 [employer].  

## 2013-06-08 NOTE — Progress Notes (Signed)
  Subjective:    Angela Bryant is a 33 y.o. female who presents for evaluation of headache. Symptoms began about 6 months ago. Generally, the headaches last about several hours and occur several times per week. The headaches do not seem to be related to any time of the day. The headaches are usually mild and ache and are located in back of head and top of head.  The patient rates her most severe headaches a 7 on a scale from 1 to 10. Recently, the headaches have been increasing in frequency. Work attendance or other daily activities are not affected by the headaches. Precipitating factors include: none which have been determined. The headaches are usually not preceded by an aura. Associated neurologic symptoms: none. The patient denies decreased physical activity, depression, dizziness, loss of balance, muscle weakness, numbness of extremities, speech difficulties, vision problems, vomiting in the early morning and worsening school/work performance. Home treatment has included acetaminophen and ibuprofen with little improvement. Other history includes: tension headaches diagnosed in the past. Family history includes no known family members with significant headaches.  The following portions of the patient's history were reviewed and updated as appropriate:  She  has a past medical history of Asthma and GERD (gastroesophageal reflux disease). She  does not have any pertinent problems on file. She  has no past surgical history on file. Her family history includes Hypertension in her father. She  reports that she has been smoking Cigarettes.  She has been smoking about 0.00 packs per day. She has never used smokeless tobacco. She reports that she drinks alcohol. She reports that she does not use illicit drugs. She has a current medication list which includes the following prescription(s): advair diskus, albuterol, epipen 2-pak, levocetirizine, linaclotide, montelukast, sertraline, and zolpidem. Current  Outpatient Prescriptions on File Prior to Visit  Medication Sig Dispense Refill  . ADVAIR DISKUS 250-50 MCG/DOSE AEPB       . albuterol (PROVENTIL HFA;VENTOLIN HFA) 108 (90 BASE) MCG/ACT inhaler Inhale 2 puffs into the lungs every 6 (six) hours as needed.      . zolpidem (AMBIEN) 10 MG tablet TAKE ONE TABLET BY MOUTH AT BEDTIME  30 tablet  5   No current facility-administered medications on file prior to visit.   She is allergic to codeine..  Review of Systems Pertinent items are noted in HPI.    Objective:    BP 112/78  Pulse 77  Temp(Src) 98.4 F (36.9 C) (Oral)  Ht 5\' 6"  (1.676 m)  Wt 149 lb 9.6 oz (67.858 kg)  BMI 24.16 kg/m2  SpO2 98%  LMP 06/04/2013 Eyes: conjunctivae/corneas clear. PERRL, EOM's intact. Fundi benign. Ears: normal TM's and external ear canals both ears Nose: Nares normal. Septum midline. Mucosa normal. No drainage or sinus tenderness. Throat: lips, mucosa, and tongue normal; teeth and gums normal Neck: no adenopathy, supple, symmetrical, trachea midline and thyroid not enlarged, symmetric, no tenderness/mass/nodules Lungs: clear to auscultation bilaterally Heart: regular rate and rhythm, S1, S2 normal, no murmur, click, rub or gallop Skin: mild burn r index finger    Assessment:    tension headache    Plan:    Lie in darkened room and apply cold packs as needed for pain. Side effect profile discussed in detail. Asked to keep headache diary. Patient reassured that neurodiagnostic workup not indicated from benign H&P. muscle relaxer and chiropractor

## 2013-06-08 NOTE — Telephone Encounter (Signed)
linzess has been sent to the pharmacy as requested

## 2013-06-08 NOTE — Progress Notes (Signed)
Pre visit review using our clinic review tool, if applicable. No additional management support is needed unless otherwise documented below in the visit note. 

## 2013-06-10 DIAGNOSIS — K5904 Chronic idiopathic constipation: Secondary | ICD-10-CM | POA: Insufficient documentation

## 2013-06-30 ENCOUNTER — Telehealth: Payer: Self-pay | Admitting: Family Medicine

## 2013-06-30 NOTE — Telephone Encounter (Signed)
Pt called in stating that she was having a reaction to medication and was dizzy, having blurry vision, and about to pass out.  Routed to CAN and left with live person for triage but was on hold for 4 minutes.  Routed to our RN for triage.

## 2013-06-30 NOTE — Telephone Encounter (Signed)
Spoke to patient who says that she took Belsomra last night for the first time and started to feel weird after taking it.  She was unable to sleep.  Felt bad this morning, but went to work.  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  She has also been nauseated all day, but assumed that this was because she did not sleep last night.  She has eaten today, but still does not feel well.  We currently do not have any appointments, therefore patient was encouraged to have someone drive her to an UC to be seen.  Pt agreed with plan and stated that she would.

## 2013-07-27 ENCOUNTER — Other Ambulatory Visit: Payer: Self-pay | Admitting: Family Medicine

## 2013-08-24 ENCOUNTER — Other Ambulatory Visit: Payer: Self-pay | Admitting: Family Medicine

## 2013-09-30 ENCOUNTER — Other Ambulatory Visit: Payer: Self-pay

## 2013-09-30 MED ORDER — CYCLOBENZAPRINE HCL 5 MG PO TABS: ORAL_TABLET | ORAL | Status: DC

## 2013-11-02 ENCOUNTER — Other Ambulatory Visit: Payer: Self-pay | Admitting: Obstetrics and Gynecology

## 2013-11-02 ENCOUNTER — Other Ambulatory Visit (HOSPITAL_COMMUNITY)
Admission: RE | Admit: 2013-11-02 | Discharge: 2013-11-02 | Disposition: A | Discharge status: Home / Self Care | Payer: 59 | Source: Ambulatory Visit | Attending: Obstetrics and Gynecology | Admitting: Obstetrics and Gynecology

## 2013-11-02 DIAGNOSIS — Z01419 Encounter for gynecological examination (general) (routine) without abnormal findings: Secondary | ICD-10-CM | POA: Diagnosis present

## 2013-11-02 DIAGNOSIS — Z1151 Encounter for screening for human papillomavirus (HPV): Secondary | ICD-10-CM | POA: Diagnosis present

## 2013-11-05 LAB — CYTOLOGY - PAP

## 2013-12-06 ENCOUNTER — Other Ambulatory Visit: Payer: Self-pay | Admitting: Family Medicine

## 2013-12-06 MED ORDER — CYCLOBENZAPRINE HCL 5 MG PO TABS: ORAL_TABLET | ORAL | Status: DC

## 2013-12-06 NOTE — Telephone Encounter (Signed)
Last seen 06/08/13 and filled 09/30/13 #30   Please advise      KP

## 2013-12-06 NOTE — Telephone Encounter (Signed)
Caller name:  Desiray Relation to pt: self Call back number: [phone] [facility]acy: [facility] [address]  Reason for call:   Patient requesting a refill of flexeril

## 2013-12-20 ENCOUNTER — Other Ambulatory Visit: Payer: Self-pay | Admitting: Family Medicine

## 2013-12-20 ENCOUNTER — Telehealth: Payer: Self-pay | Admitting: Family Medicine

## 2013-12-20 DIAGNOSIS — M62838 Other muscle spasm: Secondary | ICD-10-CM

## 2013-12-20 MED ORDER — CYCLOBENZAPRINE HCL 5 MG PO TABS: ORAL_TABLET | ORAL | Status: DC

## 2013-12-20 NOTE — Telephone Encounter (Signed)
sent 

## 2013-12-20 NOTE — Telephone Encounter (Signed)
cyclobenzoprene 5 mg  Patient called on 11-23 asking for a refill.  Looks like the request never went to Dr Laury AxonLowne.  [facility] [address]  \

## 2013-12-21 ENCOUNTER — Ambulatory Visit (INDEPENDENT_AMBULATORY_CARE_PROVIDER_SITE_OTHER): Payer: 59 | Admitting: Family Medicine

## 2013-12-21 ENCOUNTER — Telehealth: Payer: Self-pay | Admitting: Family Medicine

## 2013-12-21 ENCOUNTER — Other Ambulatory Visit: Payer: Self-pay | Admitting: Family Medicine

## 2013-12-21 ENCOUNTER — Encounter: Payer: Self-pay | Admitting: Family Medicine

## 2013-12-21 VITALS — BP 116/77 | HR 80 | Temp 98.3°F | Wt 161.8 lb

## 2013-12-21 DIAGNOSIS — M62838 Other muscle spasm: Secondary | ICD-10-CM

## 2013-12-21 DIAGNOSIS — Z8669 Personal history of other diseases of the nervous system and sense organs: Secondary | ICD-10-CM

## 2013-12-21 DIAGNOSIS — R110 Nausea: Secondary | ICD-10-CM

## 2013-12-21 LAB — CBC WITH DIFFERENTIAL/PLATELET
BASOS ABS: 0 10*3/uL (ref 0.0–0.1)
BASOS PCT: 0.4 % (ref 0.0–3.0)
EOS ABS: 0.2 10*3/uL (ref 0.0–0.7)
Eosinophils Relative: 2.7 % (ref 0.0–5.0)
HCT: 36.4 % (ref 36.0–46.0)
Hemoglobin: 11.8 g/dL — ABNORMAL LOW (ref 12.0–15.0)
LYMPHS PCT: 26.5 % (ref 12.0–46.0)
Lymphs Abs: 2.1 10*3/uL (ref 0.7–4.0)
MCHC: 32.3 g/dL (ref 30.0–36.0)
MCV: 90.7 fl (ref 78.0–100.0)
MONO ABS: 0.6 10*3/uL (ref 0.1–1.0)
Monocytes Relative: 7.3 % (ref 3.0–12.0)
NEUTROS PCT: 63.1 % (ref 43.0–77.0)
Neutro Abs: 4.9 10*3/uL (ref 1.4–7.7)
PLATELETS: 219 10*3/uL (ref 150.0–400.0)
RBC: 4.02 Mil/uL (ref 3.87–5.11)
RDW: 13.7 % (ref 11.5–15.5)
WBC: 7.8 10*3/uL (ref 4.0–10.5)

## 2013-12-21 LAB — HEPATIC FUNCTION PANEL
ALK PHOS: 39 U/L (ref 39–117)
ALT: 14 U/L (ref 0–35)
AST: 17 U/L (ref 0–37)
Albumin: 3.9 g/dL (ref 3.5–5.2)
BILIRUBIN DIRECT: 0 mg/dL (ref 0.0–0.3)
BILIRUBIN TOTAL: 0.3 mg/dL (ref 0.2–1.2)
TOTAL PROTEIN: 7.1 g/dL (ref 6.0–8.3)

## 2013-12-21 LAB — POCT URINALYSIS DIPSTICK
Bilirubin, UA: NEGATIVE
GLUCOSE UA: NEGATIVE
Ketones, UA: NEGATIVE
Leukocytes, UA: NEGATIVE
NITRITE UA: NEGATIVE
PROTEIN UA: NEGATIVE
RBC UA: NEGATIVE
SPEC GRAV UA: 1.015
UROBILINOGEN UA: 0.2
pH, UA: 6.5

## 2013-12-21 LAB — BASIC METABOLIC PANEL
BUN: 9 mg/dL (ref 6–23)
CALCIUM: 9.1 mg/dL (ref 8.4–10.5)
CHLORIDE: 105 meq/L (ref 96–112)
CO2: 25 meq/L (ref 19–32)
CREATININE: 0.7 mg/dL (ref 0.4–1.2)
GFR: 97.69 mL/min (ref >60.00)
GLUCOSE: 89 mg/dL (ref 70–99)
Potassium: 4.4 mEq/L (ref 3.5–5.1)
Sodium: 137 mEq/L (ref 135–145)

## 2013-12-21 LAB — HCG, QUANTITATIVE, PREGNANCY: Quantitative HCG: 0.51 m[IU]/mL

## 2013-12-21 LAB — POCT URINE PREGNANCY: Preg Test, Ur: NEGATIVE

## 2013-12-21 LAB — SEDIMENTATION RATE: SED RATE: 117 mm/h — AB (ref 0–22)

## 2013-12-21 MED ORDER — CYCLOBENZAPRINE HCL 10 MG PO TABS: 10.0000 mg | ORAL_TABLET | Freq: Three times a day (TID) | ORAL | Status: DC | PRN

## 2013-12-21 NOTE — Progress Notes (Signed)
  Subjective:    Angela Bryant is a 33 y.o. female who presents for follow-up of mixed tension and migraine headaches. Home treatment has included tylenol and flexeril  with some improvement. Headaches are occurring every day. Generally, the headaches last about 4 hours. Work attendance or other daily activities are not affected by the headaches. The patient denies decreased physical activity.  The following portions of the patient's history were reviewed and updated as appropriate:  She  has a past medical history of Asthma and GERD (gastroesophageal reflux disease). She  does not have any pertinent problems on file. She  has no past surgical history on file. Her family history includes Hypertension in her father. She  reports that she has been smoking Cigarettes.  She has been smoking about 0.00 packs per day. She has never used smokeless tobacco. She reports that she drinks alcohol. She reports that she does not use illicit drugs. She has a current medication list which includes the following prescription(s): advair diskus, albuterol, epipen 2-pak, levocetirizine, montelukast, and zolpidem. Current Outpatient Prescriptions on File Prior to Visit  Medication Sig Dispense Refill  . ADVAIR DISKUS 250-50 MCG/DOSE AEPB     . albuterol (PROVENTIL HFA;VENTOLIN HFA) 108 (90 BASE) MCG/ACT inhaler Inhale 2 puffs into the lungs every 6 (six) hours as needed.    Marland Kitchen EPIPEN 2-PAK 0.3 MG/0.3ML SOAJ injection     . levocetirizine (XYZAL) 5 MG tablet Take 1 tablet by mouth at bedtime.    . montelukast (SINGULAIR) 10 MG tablet Take 1 tablet by mouth at bedtime.    Marland Kitchen zolpidem (AMBIEN) 10 MG tablet TAKE ONE TABLET BY MOUTH AT BEDTIME 30 tablet 5   No current facility-administered medications on file prior to visit.   She is allergic to codeine..  Review of Systems Pertinent items are noted in HPI.    Objective:    BP 116/77 mmHg  Pulse 80  Temp(Src) 98.3 F (36.8 C) (Oral)  Wt 161 lb 12.8 oz  (73.392 kg)  SpO2 99% General appearance: alert, cooperative, appears stated age and no distress Head: Normocephalic, without obvious abnormality, atraumatic Eyes: conjunctivae/corneas clear. PERRL, EOM's intact. Fundi benign. Neck: no adenopathy, no carotid bruit, no JVD, supple, symmetrical, trachea midline and thyroid not enlarged, symmetric, no tenderness/mass/nodules Lungs: clear to auscultation bilaterally Heart: S1, S2 normal Neurologic: Alert and oriented X 3, normal strength and tone. Normal symmetric reflexes. Normal coordination and gait    Assessment:    hx migraines worsening    Plan:    Continue present treatment and plan. Lie in darkened room and apply cold packs as needed for pain. Neurodiagnostic workup: ESR to r/o temporal arteritis and MRI to r/o tumor. will consider neuro after MRI

## 2013-12-21 NOTE — Telephone Encounter (Signed)
Refill on Flexiril

## 2013-12-21 NOTE — Progress Notes (Signed)
Pre visit review using our clinic review tool, if applicable. No additional management support is needed unless otherwise documented below in the visit note. 

## 2013-12-21 NOTE — Patient Instructions (Signed)

## 2013-12-22 ENCOUNTER — Other Ambulatory Visit: Payer: Self-pay | Admitting: Family Medicine

## 2013-12-22 DIAGNOSIS — G43019 Migraine without aura, intractable, without status migrainosus: Secondary | ICD-10-CM

## 2013-12-22 DIAGNOSIS — R700 Elevated erythrocyte sedimentation rate: Secondary | ICD-10-CM

## 2013-12-23 ENCOUNTER — Telehealth: Payer: Self-pay | Admitting: Family Medicine

## 2013-12-23 NOTE — Telephone Encounter (Signed)
Caller name: Shawnae Relation to pt: self Call back number: [phone] Pharmacy:  Reason for call:   Patient has additional questions regarding sedimentation rate results and wants to know more about this.

## 2013-12-23 NOTE — Telephone Encounter (Signed)
Pt following up

## 2013-12-23 NOTE — Telephone Encounter (Signed)
Spoke with patient and she stated she has been having head pressure in her temples, and she is concerned. She said the headache is dull and annoying. I made her aware that her apt is Sat for MRI and her Neuro is on the 23 rd but if she has severe pain, with blurred vision, Nausea, vomiting, Syncope to go straight to the ED. She voiced understanding, She had been worried because she had been googling and getting different information, I made her aware that is a mistake and we will not know anything for sure until the MRI is back, She voiced understanding.   KP

## 2013-12-25 ENCOUNTER — Other Ambulatory Visit (HOSPITAL_BASED_OUTPATIENT_CLINIC_OR_DEPARTMENT_OTHER): Payer: 59

## 2013-12-25 ENCOUNTER — Ambulatory Visit (HOSPITAL_BASED_OUTPATIENT_CLINIC_OR_DEPARTMENT_OTHER)
Admission: RE | Admit: 2013-12-25 | Discharge: 2013-12-25 | Disposition: A | Discharge status: Home / Self Care | Payer: 59 | Source: Ambulatory Visit | Attending: Family Medicine | Admitting: Family Medicine

## 2013-12-25 DIAGNOSIS — G43909 Migraine, unspecified, not intractable, without status migrainosus: Secondary | ICD-10-CM | POA: Insufficient documentation

## 2013-12-25 DIAGNOSIS — Z8669 Personal history of other diseases of the nervous system and sense organs: Secondary | ICD-10-CM

## 2013-12-25 MED ORDER — GADOBENATE DIMEGLUMINE 529 MG/ML IV SOLN: 15.0000 mL | Freq: Once | INTRAVENOUS | Status: AC | PRN

## 2013-12-27 ENCOUNTER — Telehealth: Payer: Self-pay | Admitting: Family Medicine

## 2013-12-27 MED ORDER — CEFUROXIME AXETIL 500 MG PO TABS: 500.0000 mg | ORAL_TABLET | Freq: Two times a day (BID) | ORAL | Status: DC

## 2013-12-27 MED ORDER — FLUCONAZOLE 150 MG PO TABS: 150.0000 mg | ORAL_TABLET | Freq: Once | ORAL | Status: DC

## 2013-12-27 NOTE — Telephone Encounter (Signed)
discussed with patient and she voiced understanding, she has agreed to start the ABX and will follow up prn. Neuro apt 01/05/14.      KP

## 2013-12-27 NOTE — Telephone Encounter (Signed)
We still need to figure that out-- that is what I'm hoping the Neurologist can help with

## 2013-12-27 NOTE — Telephone Encounter (Signed)
Pt inquiring about x ray results, please advise

## 2013-12-27 NOTE — Telephone Encounter (Signed)
Patient calling again on this. Best # [phone]

## 2013-12-27 NOTE — Telephone Encounter (Addendum)
Notes Recorded by Lelon PerlaYvonne R Lowne, DO on 12/27/2013 at 1:20 PM Essentially normal--- may have some sinus issues--- any congestion, fever etc? If yes--- ceftin 500 mg 1 po bid x 10 days   Spoke with patient and she said she was not having any sinus issues. She wanted to know why her Sed rate was so high, she is also having right sided temple swelling.   Please advise       KP

## 2014-01-05 ENCOUNTER — Other Ambulatory Visit (HOSPITAL_BASED_OUTPATIENT_CLINIC_OR_DEPARTMENT_OTHER): Payer: 59

## 2014-01-05 ENCOUNTER — Ambulatory Visit (INDEPENDENT_AMBULATORY_CARE_PROVIDER_SITE_OTHER): Payer: 59 | Admitting: Neurology

## 2014-01-05 ENCOUNTER — Encounter: Payer: Self-pay | Admitting: Neurology

## 2014-01-05 VITALS — BP 122/78 | HR 76 | Temp 98.7°F | Resp 20 | Ht 67.0 in | Wt 159.9 lb

## 2014-01-05 DIAGNOSIS — G44209 Tension-type headache, unspecified, not intractable: Secondary | ICD-10-CM

## 2014-01-05 DIAGNOSIS — R700 Elevated erythrocyte sedimentation rate: Secondary | ICD-10-CM

## 2014-01-05 MED ORDER — NORTRIPTYLINE HCL 10 MG PO CAPS: 10.0000 mg | ORAL_CAPSULE | Freq: Every day | ORAL | Status: DC

## 2014-01-05 NOTE — Progress Notes (Signed)
NEUROLOGY CONSULTATION NOTE  Angela Bryant MRN: [id number] DOB: 09/14/1980  Referring provider: Dr. Laury AxonLowne Primary care provider: Dr. Laury AxonLowne  Reason for consult:  Headache  HISTORY OF PRESENT ILLNESS: Angela PeopleStephanie Stiehl is a 33 year old right-handed woman with migraine and asthma who presents for headache.  Records, labs and MRI of brain reviewed.  About one year ago, she began having frequent headaches.  They are located in both temples and back of head.  It is a pressure-like pain.  5/10 intensity.  It is not associated with other symptoms such as nausea, photophobia, phonophobia or visual disturbance.  It lasts a few hours and occurs 2 to 3 times a week.  She takes Aleve or Excedrin Tension Headache.  Due to stress, she began having headaches daily up until the MRI was found to be okay.  She has no prior history of headache.  About a couple of months ago, she would periodically develop swelling over the right temple.  It is not tender or painful.  She reports that a recent eye exam was normal.  She denies fever, rash, arthralgia, or myalgia.  A sed rate was checked on 12/21/13, which was 117.  She had an MRI of the brain with and without contrast, which was unremarkable.  It did show signs of sinusitis, so she was started on an antibiotic.  Other medications:  Advair, albuterol, Singulair, Ambien  Caffeine:  Ice coffee, tea, soda Alcohol:  occasionally Smoker:  infrequently Depression/stress:  stable Sleep hygiene:  Poor unless taking Ambien Family history of headache:  No  Incidentally, she reports that when she used to get drunk, it appeared that the left side of her face looked droopy.  It only occurred when she was drunk and she hasn't been drunk for quite some time.    PAST MEDICAL HISTORY: Past Medical History  Diagnosis Date  . Asthma   . GERD (gastroesophageal reflux disease)     PAST SURGICAL HISTORY: No past surgical history on file.  MEDICATIONS: Current  Outpatient Prescriptions on File Prior to Visit  Medication Sig Dispense Refill  . ADVAIR DISKUS 250-50 MCG/DOSE AEPB     . albuterol (PROVENTIL HFA;VENTOLIN HFA) 108 (90 BASE) MCG/ACT inhaler Inhale 2 puffs into the lungs every 6 (six) hours as needed.    . cefUROXime (CEFTIN) 500 MG tablet Take 1 tablet (500 mg total) by mouth 2 (two) times daily with a meal. 20 tablet 0  . cyclobenzaprine (FLEXERIL) 10 MG tablet Take 1 tablet (10 mg total) by mouth 3 (three) times daily as needed for muscle spasms. 30 tablet 0  . fluconazole (DIFLUCAN) 150 MG tablet Take 1 tablet (150 mg total) by mouth once. Repeat in 3 days if needed 2 tablet 0  . levocetirizine (XYZAL) 5 MG tablet Take 1 tablet by mouth at bedtime.    . montelukast (SINGULAIR) 10 MG tablet Take 1 tablet by mouth at bedtime.    . traZODone (DESYREL) 150 MG tablet Take 150 mg by mouth at bedtime.    Marland Kitchen. zolpidem (AMBIEN) 10 MG tablet TAKE ONE TABLET BY MOUTH AT BEDTIME 30 tablet 5   No current facility-administered medications on file prior to visit.    ALLERGIES: Allergies  Allergen Reactions  . Codeine Nausea And Vomiting    FAMILY HISTORY: Family History  Problem Relation Age of Onset  . Hypertension Father   . Asthma Mother     SOCIAL HISTORY: History   Social History  . Marital Status: Single  Spouse Name: N/A    Number of Children: 0  . Years of Education: N/A   Occupational History  . t [employer]    Social History Main Topics  . Smoking status: Current Some Day Smoker    Types: Cigarettes    Last Attempt to Quit: 12/09/2012  . Smokeless tobacco: Never Used  . Alcohol Use: 0.0 oz/week    0 Not specified per week     Comment: occasional   . Drug Use: No  . Sexual Activity:    Partners: Male   Other Topics Concern  . Not on file   Social History Narrative    REVIEW OF SYSTEMS: Constitutional: No fevers, chills, or sweats, no generalized fatigue, change in appetite Eyes: No visual changes, double  vision, eye pain Ear, nose and throat: No hearing loss, ear pain, nasal congestion, sore throat Cardiovascular: No chest pain, palpitations Respiratory:  No shortness of breath at rest or with exertion, wheezes GastrointestinaI: No nausea, vomiting, diarrhea, abdominal pain, fecal incontinence Genitourinary:  No dysuria, urinary retention or frequency Musculoskeletal:  No neck pain, back pain Integumentary: No rash, pruritus, skin lesions Neurological: as above Psychiatric: No depression, insomnia, anxiety Endocrine: No palpitations, fatigue, diaphoresis, mood swings, change in appetite, change in weight, increased thirst Hematologic/Lymphatic:  No anemia, purpura, petechiae. Allergic/Immunologic: no itchy/runny eyes, nasal congestion, recent allergic reactions, rashes  PHYSICAL EXAM: Filed Vitals:   01/05/14 1435  BP: 122/78  Pulse: 76  Temp: 98.7 F (37.1 C)  Resp: 20   General: No acute distress Head:  Normocephalic/atraumatic Eyes:  fundi unremarkable, without vessel changes, exudates, hemorrhages or papilledema. Neck: supple, no paraspinal tenderness, full range of motion Back: No paraspinal tenderness Heart: regular rate and rhythm Lungs: Clear to auscultation bilaterally. Vascular: No carotid bruits. Neurological Exam: Mental status: alert and oriented to person, place, and time, recent and remote memory intact, fund of knowledge intact, attention and concentration intact, speech fluent and not dysarthric, language intact. Cranial nerves: CN I: not tested CN II: pupils equal, round and reactive to light, visual fields intact, fundi unremarkable, without vessel changes, exudates, hemorrhages or papilledema. CN III, IV, VI:  full range of motion, no nystagmus, no ptosis CN V: facial sensation intact CN VII: upper and lower face symmetric CN VIII: hearing intact CN IX, X: gag intact, uvula midline CN XI: sternocleidomastoid and trapezius muscles intact CN XII: tongue  midline Bulk & Tone: normal, no fasciculations. Motor:  5/5 throughout Sensation:  Temperature and vibration intact Deep Tendon Reflexes:  2+ throughout, toes downgoing Finger to nose testing:  No dysmetria Heel to shin:  No dysmetria Gait:  Normal station and stride.  Able to turn and walk in tandem. Romberg negative.  IMPRESSION: Headache Elevated sed rate.  With the headache, occasional swelling of the right temple and elevated sed rate, one must consider temporal arteritis.  However, she is not in the typical age range for this.  I do not appreciate any palpation of her temporal artery.  She does not describe any systemic symptoms of a rheumatologic condition.  She has had no vision loss and recent eye exam was reportedly okay.  Therefore, the diagnosis is low on my differential.  I really suspect that the headaches are tension-type headaches.  The elevated sed rate is of unknown significance to me.  Regarding the reported facial droop which occurs when intoxicated, I have no explanation for that.  PLAN: We will treat the headache with nortriptyline 10mg  daily She should limit use  of pain relievers to no more than 2 days out of the week. I have no explanation for the elevated sed rate.  Therefore, I will refer to rheumatology. Follow up in 2 months or after rheum consult  Thank you for allowing me to take part in the care of this patient.  Shon Millet, DO  CC:  Loreen Freud, DO

## 2014-01-05 NOTE — Patient Instructions (Signed)
1.  For the headache, which i feel are tension headaches, we will start nortriptyline 10mg  at bedtime.  Call in 4 weeks with update and we can increase dose if needed. 2.  Limit use of pain relievers to no more than 2 days out of the week to prevent rebound headache 3.  For the elevated sedimentation rate, I will send you over to see a rheumatologist. 4.  Follow up in 2 months or after seeing the rheumatologist.

## 2014-01-17 ENCOUNTER — Telehealth: Payer: Self-pay | Admitting: *Deleted

## 2014-01-17 ENCOUNTER — Telehealth: Payer: Self-pay | Admitting: Neurology

## 2014-01-17 NOTE — Telephone Encounter (Signed)
Pt needs to talk to someone about the status of a referral that we were sending out for her please call [phone]

## 2014-01-17 NOTE — Telephone Encounter (Signed)
patient  is aware that her records have been sent to Dr. Pollyann Savoy, MD they will contact with appt

## 2014-01-20 ENCOUNTER — Other Ambulatory Visit: Payer: Self-pay | Admitting: Family Medicine

## 2014-01-28 ENCOUNTER — Telehealth: Payer: Self-pay | Admitting: Neurology

## 2014-01-28 NOTE — Telephone Encounter (Signed)
Labs from 12/22/14 faxed to Dr Corliss Skainseveshwar at [phone] with confirmation received.

## 2014-01-28 NOTE — Telephone Encounter (Signed)
-----   Message from Shary DecampAndrea S Loye sent at 01/28/2014 10:13 AM EST ----- Regarding: Ragen from Dr. Corliss Skainseveshwar  Contact: [phone] Dr. Everlena CooperJaffe sent a referral for pt. Dr. Corliss Skainseveshwar would like for the pt's lab work to be faxed over. Fax#[phone]

## 2014-02-26 ENCOUNTER — Other Ambulatory Visit: Payer: Self-pay | Admitting: Family Medicine

## 2014-03-04 ENCOUNTER — Telehealth: Payer: Self-pay | Admitting: Neurology

## 2014-03-04 NOTE — Telephone Encounter (Signed)
Pt canceled appt to Dr Everlena CooperJaffe on 03-09-14 and will call back to resch

## 2014-03-09 ENCOUNTER — Ambulatory Visit: Payer: 59 | Admitting: Neurology

## 2014-03-30 ENCOUNTER — Other Ambulatory Visit: Payer: Self-pay | Admitting: Family Medicine

## 2014-06-10 ENCOUNTER — Other Ambulatory Visit: Payer: Self-pay | Admitting: Family Medicine

## 2014-08-02 ENCOUNTER — Telehealth: Payer: Self-pay | Admitting: Gastroenterology

## 2014-08-02 NOTE — Telephone Encounter (Signed)
Pt states she gets cramps for hours after taking Linzess, she has stopped taking it and will try miralax 2 capfuls daily.  I advised her I will let Dr Christella HartiganJacobs know and see if Valinda Hoaramitiza is an option.  Pt states she believes she was given amitiza once as a sample and it worked without cramps.  Please advise

## 2014-08-02 NOTE — Telephone Encounter (Signed)
Left message on machine to call back  

## 2014-08-03 MED ORDER — LUBIPROSTONE 24 MCG PO CAPS
24.0000 ug | ORAL_CAPSULE | Freq: Two times a day (BID) | ORAL | Status: DC
Start: 2014-08-03 — End: 2014-09-26

## 2014-08-03 NOTE — Telephone Encounter (Signed)
Happy to presribe amitiza if the miralax regimen is not working for her.  She should give that 7-10 days then call back to report on her progress.

## 2014-08-03 NOTE — Telephone Encounter (Signed)
Patient states the Miralax regimen is not working. Informed patient that we sent Amitiza to her pharmacy to take twice a day and to call back in 7-10 days with a update on her symptoms. Pt verbalized understanding.

## 2014-08-08 ENCOUNTER — Telehealth: Payer: Self-pay | Admitting: Gastroenterology

## 2014-08-08 NOTE — Telephone Encounter (Signed)
Pt aware and will call with an update this week.

## 2014-08-08 NOTE — Telephone Encounter (Signed)
Pt last bowel movement 5-6 days ago taking 2 caps of miralax daily. She is passing gas.   Waiting for prior auth for amitiza.  She had some samples of amitiza and took that this morning..  Pt would like to know if she needs to come in or any further recommendations.  I did advise that she may need to purge her bowels with miralax but I will send to Dr Christella Hartigan for review.

## 2014-08-08 NOTE — Telephone Encounter (Signed)
i agree that she should complete bowel purge with a fill container of miralax in 64 oz gatorade. Then restart miralax 2 doses once daily, await [place]

## 2014-08-11 ENCOUNTER — Telehealth: Payer: Self-pay | Admitting: Gastroenterology

## 2014-08-11 NOTE — Telephone Encounter (Signed)
The pt states she has still not did the purge but she will try to do that on Sunday.  The pt was also advised that the amitiza was approved.  She will call back with an update

## 2014-08-19 ENCOUNTER — Telehealth: Payer: Self-pay | Admitting: Gastroenterology

## 2014-08-22 ENCOUNTER — Other Ambulatory Visit: Payer: Self-pay | Admitting: Gastroenterology

## 2014-08-22 NOTE — Telephone Encounter (Signed)
linzess has been refilled message left notifying the pt .

## 2014-08-24 ENCOUNTER — Telehealth: Payer: Self-pay | Admitting: Gastroenterology

## 2014-08-24 NOTE — Telephone Encounter (Signed)
Pt aware that the Linzess auth has been done and will take about 48 hours to complete

## 2014-08-28 ENCOUNTER — Other Ambulatory Visit: Payer: Self-pay | Admitting: Family Medicine

## 2014-08-29 NOTE — Telephone Encounter (Signed)
Patient needs an apt per Dr.Lowne, please schedule.     KP

## 2014-08-29 NOTE — Telephone Encounter (Signed)
Last seen 12/21/13 and filled 06/10/14 #30 with 1 refill   Please advise     KP

## 2014-08-29 NOTE — Telephone Encounter (Signed)
Patient's Linzess has been approved through [employer] until 08/24/15. [id number].

## 2014-08-30 NOTE — Telephone Encounter (Signed)
Scheduled for 09/26/2014

## 2014-09-20 ENCOUNTER — Other Ambulatory Visit: Payer: Self-pay | Admitting: Dermatology

## 2014-09-26 ENCOUNTER — Encounter: Payer: Self-pay | Admitting: Family Medicine

## 2014-09-26 ENCOUNTER — Other Ambulatory Visit: Payer: Self-pay | Admitting: Family Medicine

## 2014-09-26 ENCOUNTER — Ambulatory Visit (INDEPENDENT_AMBULATORY_CARE_PROVIDER_SITE_OTHER): Payer: Commercial Managed Care - HMO | Admitting: Family Medicine

## 2014-09-26 VITALS — BP 110/70 | HR 92 | Temp 99.1°F | Ht 67.0 in | Wt 154.0 lb

## 2014-09-26 DIAGNOSIS — G44221 Chronic tension-type headache, intractable: Secondary | ICD-10-CM | POA: Diagnosis not present

## 2014-09-26 MED ORDER — CYCLOBENZAPRINE HCL 10 MG PO TABS: 10.0000 mg | ORAL_TABLET | Freq: Three times a day (TID) | ORAL | Status: DC | PRN

## 2014-09-26 MED ORDER — NORTRIPTYLINE HCL 10 MG PO CAPS: 10.0000 mg | ORAL_CAPSULE | Freq: Every day | ORAL | Status: DC

## 2014-09-26 NOTE — Progress Notes (Signed)
Pre visit review using our clinic review tool, if applicable. No additional management support is needed unless otherwise documented below in the visit note. 

## 2014-09-26 NOTE — Patient Instructions (Signed)

## 2014-09-26 NOTE — Progress Notes (Signed)
Patient ID: Angela Bryant, female    DOB: 1980-04-19  Age: 34 y.o. MRN: [id number]    Subjective:  Subjective HPI Angela Bryant presents for f/u migraines and elevated esr.  She saw rheum and pt was put on elavil -- she never took it.  She had a bad experience with 2 neurologists so she never went back.    Review of Systems  Constitutional: Negative for diaphoresis, appetite change, fatigue and unexpected weight change.  Eyes: Negative for pain, redness and visual disturbance.  Respiratory: Negative for cough, chest tightness, shortness of breath and wheezing.   Cardiovascular: Negative for chest pain, palpitations and leg swelling.  Endocrine: Negative for cold intolerance, heat intolerance, polydipsia, polyphagia and polyuria.  Genitourinary: Negative for dysuria, frequency and difficulty urinating.  Neurological: Positive for headaches. Negative for dizziness, light-headedness and numbness.  Psychiatric/Behavioral: Negative for decreased concentration. The patient is not nervous/anxious.     History Past Medical History  Diagnosis Date  . Asthma   . GERD (gastroesophageal reflux disease)     She has no past surgical history on file.   Her family history includes Asthma in her mother; Hypertension in her father.She reports that she has been smoking Cigarettes.  She has never used smokeless tobacco. She reports that she drinks alcohol. She reports that she does not use illicit drugs.  Current Outpatient Prescriptions on File Prior to Visit  Medication Sig Dispense Refill  . ADVAIR DISKUS 250-50 MCG/DOSE AEPB     . albuterol (PROVENTIL HFA;VENTOLIN HFA) 108 (90 BASE) MCG/ACT inhaler Inhale 2 puffs into the lungs every 6 (six) hours as needed.    Marland Kitchen levocetirizine (XYZAL) 5 MG tablet Take 1 tablet by mouth at bedtime.    Marland Kitchen LINZESS 145 MCG CAPS capsule TAKE ONE CAPSULE BY MOUTH ONCE DAILY 30 capsule 0  . montelukast (SINGULAIR) 10 MG tablet Take 1 tablet by mouth at bedtime.      . traZODone (DESYREL) 150 MG tablet Take 150 mg by mouth at bedtime.    Marland Kitchen zolpidem (AMBIEN) 10 MG tablet TAKE ONE TABLET BY MOUTH AT BEDTIME 30 tablet 5   No current facility-administered medications on file prior to visit.     Objective:  Objective Physical Exam  Constitutional: She is oriented to person, place, and time. She appears well-developed and well-nourished.  HENT:  Head: Normocephalic and atraumatic.  Eyes: Conjunctivae and EOM are normal.  Neck: Normal range of motion. Neck supple. No JVD present. Carotid bruit is not present. No thyromegaly present.  Cardiovascular: Normal rate, regular rhythm and normal heart sounds.   No murmur heard. Pulmonary/Chest: Effort normal and breath sounds normal. No respiratory distress. She has no wheezes. She has no rales. She exhibits no tenderness.  Musculoskeletal: She exhibits no edema.  Neurological: She is alert and oriented to person, place, and time.  Psychiatric: She has a normal mood and affect. Her behavior is normal. Judgment and thought content normal.   BP 110/70 mmHg  Pulse 92  Temp(Src) 99.1 F (37.3 C) (Oral)  Ht '5\' 7"'  (1.702 m)  Wt 154 lb (69.854 kg)  BMI 24.11 kg/m2  SpO2 98% Wt Readings from Last 3 Encounters:  09/26/14 154 lb (69.854 kg)  01/05/14 159 lb 14.4 oz (72.53 kg)  12/21/13 161 lb 12.8 oz (73.392 kg)     Lab Results  Component Value Date   WBC 7.8 12/21/2013   HGB 11.8* 12/21/2013   HCT 36.4 12/21/2013   PLT 219.0 12/21/2013   GLUCOSE 89  12/21/2013   ALT 14 12/21/2013   AST 17 12/21/2013   NA 137 12/21/2013   K 4.4 12/21/2013   CL 105 12/21/2013   CREATININE 0.7 12/21/2013   BUN 9 12/21/2013   CO2 25 12/21/2013   TSH 0.85 12/23/2012    Mr Brain W Wo Contrast  12/26/2013   CLINICAL DATA:  Personal history of migraine headaches, worsening in severity and frequency. Symptoms on the right side of the face.  EXAM: MRI HEAD WITHOUT AND WITH CONTRAST  TECHNIQUE: Multiplanar, multiecho pulse  sequences of the brain and surrounding structures were obtained without and with intravenous contrast.  CONTRAST:  15 cc MultiHance  COMPARISON:  Head CT 03/21/2009  FINDINGS: The brain has normal appearance without evidence of malformation, atrophy, old or acute infarction, mass lesion, hemorrhage, hydrocephalus or extra-axial collection. No pituitary mass. There is a small amount of fluid within the sphenoid sinus of questionable significance. There is mild mucosal thickening of the maxillary sinuses. After contrast administration, no abnormal enhancement occurs.  IMPRESSION: Normal appearance of the brain itself.  Small amount of fluid in the sphenoid sinus and mild mucosal thickening of the maxillary sinuses, possibly subclinical.   Electronically Signed   By: Nelson Chimes M.D.   On: 12/26/2013 13:32     Assessment & Plan:  Plan I have discontinued Angela Bryant's cefUROXime, fluconazole, and lubiprostone. I have also changed her cyclobenzaprine. Additionally, I am having her maintain her albuterol, zolpidem, ADVAIR DISKUS, montelukast, levocetirizine, traZODone, LINZESS, ALPRAZolam, and nortriptyline.  Meds ordered this encounter  Medications  . ALPRAZolam (XANAX) 1 MG tablet    Sig: Take 1 tablet by mouth 2 (two) times daily as needed.  . nortriptyline (PAMELOR) 10 MG capsule    Sig: Take 1 capsule (10 mg total) by mouth at bedtime.    Dispense:  30 capsule    Refill:  1  . cyclobenzaprine (FLEXERIL) 10 MG tablet    Sig: Take 1 tablet (10 mg total) by mouth 3 (three) times daily as needed. for muscle spams    Dispense:  30 tablet    Refill:  0    Problem List Items Addressed This Visit    None    Visit Diagnoses    Chronic tension-type headache, intractable    -  Primary    Relevant Medications    nortriptyline (PAMELOR) 10 MG capsule    cyclobenzaprine (FLEXERIL) 10 MG tablet     she will try to nortripyline and she wants to hold off on neuro and let us know if she changes her  mind  Follow-up: Return if symptoms worsen or fail to improve, for annual exam, fasting.  Garnet Koyanagi, DO

## 2014-10-10 ENCOUNTER — Telehealth: Payer: Self-pay | Admitting: Gastroenterology

## 2014-10-10 NOTE — Telephone Encounter (Signed)
Pt states she is having problems with constipation and rectal bleeding. Pt states she has internal hemorrhoids but would like to be seen to make sure there is not any other issue. Pt scheduled to see Lori Hvozdovic, PA-C 10/31/14@8 :45am. Pt aware of appt.

## 2014-10-10 NOTE — Telephone Encounter (Signed)
Left message for pt to call back  °

## 2014-10-31 ENCOUNTER — Encounter: Payer: Self-pay | Admitting: Physician Assistant

## 2014-10-31 ENCOUNTER — Ambulatory Visit (INDEPENDENT_AMBULATORY_CARE_PROVIDER_SITE_OTHER): Payer: Commercial Managed Care - HMO | Admitting: Physician Assistant

## 2014-10-31 VITALS — BP 110/70 | HR 100 | Ht 66.0 in | Wt 156.0 lb

## 2014-10-31 DIAGNOSIS — K602 Anal fissure, unspecified: Secondary | ICD-10-CM | POA: Diagnosis not present

## 2014-10-31 DIAGNOSIS — K648 Other hemorrhoids: Secondary | ICD-10-CM | POA: Diagnosis not present

## 2014-10-31 DIAGNOSIS — K644 Residual hemorrhoidal skin tags: Secondary | ICD-10-CM

## 2014-10-31 DIAGNOSIS — K5900 Constipation, unspecified: Secondary | ICD-10-CM

## 2014-10-31 MED ORDER — DILTIAZEM GEL 2 %: 1.0000 "application " | Freq: Two times a day (BID) | CUTANEOUS | Status: DC

## 2014-10-31 MED ORDER — SENNOSIDES-DOCUSATE SODIUM 8.6-50 MG PO TABS: 2.0000 | ORAL_TABLET | Freq: Every day | ORAL | Status: DC

## 2014-10-31 NOTE — Progress Notes (Signed)
Patient ID: Angela PeopleStephanie Bryant, female   DOB: 10/06/1980, 34 y.o.   MRN: [id number]     History of Present Illness: Angela Bryant is a pleasant 34 year old female who is known to Dr. Christella HartiganJacobs. She has been evaluated in the past for dyspepsia and GERD and has been offered upper endoscopy twice in the past 6 or 7 years and she has declined. She has been evaluated in the office in the past for what sounded like alternating constipation and diarrhea and minor rectal bleeding. She was last seen in the office in January 2015 with predominantly constipation. She was noted to have elevated inflammatory markers including an elevated sedimentation rate and elevated CRP. She was advised to start using Mira lax on a daily basis and was scheduled for colonoscopy. She underwent a colonoscopy on 03/01/2013 and was noted to have a normal mucosa in the terminal ileum, normal colon, and hemorrhoids. She was given a trial of Linzess which she says caused terrible abdominal cramping and explosive diarrhea and so she discontinued it after several days. She also tried Amitiza which she says provided no relief after several days and so she discontinued it. Over the past several weeks, she has been using Senokot S, 2 tablets at bedtime every other night, and with this she is having a soft formed bowel movement every other day. She reports however, that for the past several weeks she has rectal pain and bleeding with bowel movements. She feels as if she is passing razor blades. She has also noted some hemorrhoids. She has been using Preparation H wipes with little relief. She has no abdominal pain.   Past Medical History  Diagnosis Date  . Asthma   . GERD (gastroesophageal reflux disease)     History reviewed. No pertinent past surgical history. Family History  Problem Relation Age of Onset  . Hypertension Father   . Asthma Mother    Social History  Substance Use Topics  . Smoking status: Light Tobacco Smoker    Types:  Cigarettes  . Smokeless tobacco: Never Used     Comment: rarely has a cigarrette, sometimes on weekends  . Alcohol Use: 0.0 oz/week    0 Standard drinks or equivalent per week     Comment: occasional    Current Outpatient Prescriptions  Medication Sig Dispense Refill  . ADVAIR DISKUS 250-50 MCG/DOSE AEPB     . albuterol (PROVENTIL HFA;VENTOLIN HFA) 108 (90 BASE) MCG/ACT inhaler Inhale 2 puffs into the lungs every 6 (six) hours as needed.    . ALPRAZolam (XANAX) 1 MG tablet Take 1 tablet by mouth 2 (two) times daily as needed.    . cyclobenzaprine (FLEXERIL) 10 MG tablet TAKE ONE TABLET BY MOUTH THREE TIMES DAILY AS NEEDED FOR MUSCLE SPASM 30 tablet 0  . levocetirizine (XYZAL) 5 MG tablet Take 1 tablet by mouth at bedtime.    Marland Kitchen. LINZESS 145 MCG CAPS capsule TAKE ONE CAPSULE BY MOUTH ONCE DAILY 30 capsule 0  . montelukast (SINGULAIR) 10 MG tablet Take 1 tablet by mouth at bedtime.    . nortriptyline (PAMELOR) 10 MG capsule Take 1 capsule (10 mg total) by mouth at bedtime. 30 capsule 1  . senna (SENOKOT) 8.6 MG tablet Take 1 tablet by mouth daily.    . traZODone (DESYREL) 150 MG tablet Take 150 mg by mouth at bedtime.    Marland Kitchen. zolpidem (AMBIEN) 10 MG tablet TAKE ONE TABLET BY MOUTH AT BEDTIME 30 tablet 5  . diltiazem 2 % GEL Apply 1  application topically 2 (two) times daily. 1 Package 1  . senna-docusate (SENOKOT-S) 8.6-50 MG tablet Take 2 tablets by mouth at bedtime. 60 tablet 2   No current facility-administered medications for this visit.   Allergies  Allergen Reactions  . Codeine Nausea And Vomiting     Review of Systems: Gen: Denies any fever, chills, sweats, anorexia, fatigue, weakness, malaise, weight loss, and sleep disorder CV: Denies chest pain, angina, palpitations, syncope, orthopnea, PND, peripheral edema, and claudication. Resp: Denies dyspnea at rest, dyspnea with exercise, cough, sputum, wheezing, coughing up blood, and pleurisy. GI: Denies vomiting blood, jaundice, and  fecal incontinence.   Denies dysphagia or odynophagia. GU : Denies urinary burning, blood in urine, urinary frequency, urinary hesitancy, nocturnal urination, and urinary incontinence. MS: Denies joint pain, limitation of movement, and swelling, stiffness, low back pain, extremity pain. Denies muscle weakness, cramps, atrophy.  Derm: Denies rash, itching, dry skin, hives, moles, warts, or unhealing ulcers.  Psych: Denies depression, anxiety, memory loss, suicidal ideation, hallucinations, paranoia, and confusion. Heme: Denies bruising, bleeding, and enlarged lymph nodes. Neuro:  Denies any headaches, dizziness, paresthesia Endo:  Denies any problems with DM, thyroid, adrenal  Endoscopies: Colonoscopy 03/01/2013 revealed normal mucosa in the ileum, normal colon, hemorrhoids.  Physical Exam: BP 110/70 mmHg  Pulse 100  Ht  (1.676 m)  Wt 156 lb (70.761 kg)  BMI 25.19 kg/m2  LMP 10/24/2014 General: Pleasant, well developed , Caucasian female in no acute distress Head: Normocephalic and atraumatic Eyes:  sclerae anicteric, conjunctiva pink  Ears: Normal auditory acuity Lungs: Clear throughout to auscultation Heart: Regular rate and rhythm Abdomen: Soft, non distended, non-tender. No masses, no hepatomegaly. Normal bowel sounds Rectal: Small external hemorrhoids, digital rectal exam was exquisitely tender for patient and there is a small posterior ridge, suggestive of a fissure. Musculoskeletal: Symmetrical with no gross deformities  Extremities: No edema  Neurological: Alert oriented x 4, grossly nonfocal Psychological:  Alert and cooperative. Normal mood and affect  Assessment and Recommendations: #1 constipation. Patient states she has had no relief with Mira lax, hematochezia, or lesions S, and she is not interested in trying them again. She has been doing well with Senokot, and will continue Senokot 1-2 tablets at bedtime every other night as needed. She has also been instructed to  add "P fruits" to her diet and to increase water intake. She will also try a heaping tablespoon of Benefiber in 6 ounces of water each morning.  #2. Anal fissure. She will be given a trial of diltiazem 2% ointment to apply rectally 2-3 times daily for 6 weeks. She will return in one to one and a half months to assess response, sooner if needed.  #3. External hemorrhoids. She says she has over-the-counter hemorrhoid cream to use and will continue to use that as needed. She will use Tucks wipes.  She will follow up in 4-6 weeks, sooner if needed.        Tryton Bodi, Tollie Pizza PA-C 10/31/2014,

## 2014-10-31 NOTE — Patient Instructions (Signed)
We have sent the following medications to your pharmacy for you to pick up at your convenience: Diltiazem 2% gel is at the [facility]. Apply rectally twice a day x 6 weeks.  Senna S 1-2 tablets every night or every other night.   Please get Tucks wipes. These are over the counter.  Please read over the handouts you were given today.   Try to eat more "P" fruits: peaches, pears, prunes, plums, pineapple, pear juice.  Please call to make a follow up appointment with Lawson FiscalLori Hvozdovic, PA-C in 4-5 weeks.

## 2014-11-01 NOTE — Progress Notes (Signed)
i agree with the above note, plan 

## 2014-11-02 ENCOUNTER — Other Ambulatory Visit (HOSPITAL_COMMUNITY)
Admission: RE | Admit: 2014-11-02 | Discharge: 2014-11-02 | Disposition: A | Discharge status: Home / Self Care | Payer: Commercial Managed Care - HMO | Source: Ambulatory Visit | Attending: Obstetrics and Gynecology | Admitting: Obstetrics and Gynecology

## 2014-11-02 ENCOUNTER — Other Ambulatory Visit: Payer: Self-pay | Admitting: Obstetrics and Gynecology

## 2014-11-02 DIAGNOSIS — Z01419 Encounter for gynecological examination (general) (routine) without abnormal findings: Secondary | ICD-10-CM | POA: Diagnosis not present

## 2014-11-03 LAB — CYTOLOGY - PAP

## 2014-12-02 ENCOUNTER — Other Ambulatory Visit: Payer: Self-pay | Admitting: Family Medicine

## 2014-12-04 ENCOUNTER — Other Ambulatory Visit: Payer: Self-pay | Admitting: Gastroenterology

## 2014-12-05 ENCOUNTER — Telehealth: Payer: Self-pay | Admitting: Family Medicine

## 2014-12-05 MED ORDER — CYCLOBENZAPRINE HCL 10 MG PO TABS: 10.0000 mg | ORAL_TABLET | Freq: Three times a day (TID) | ORAL | Status: DC | PRN

## 2014-12-05 NOTE — Telephone Encounter (Addendum)
°  Pharmacy: [facility] [place] - [address] [phone] (Phone) [phone] (Fax)         Reason for call:  Patient requesting a refill cyclobenzaprine (FLEXERIL) 10 MG tablet

## 2014-12-05 NOTE — Telephone Encounter (Signed)
Rx faxed.    KP 

## 2014-12-27 ENCOUNTER — Telehealth: Payer: Self-pay | Admitting: Gastroenterology

## 2014-12-27 ENCOUNTER — Telehealth: Payer: Self-pay | Admitting: Internal Medicine

## 2014-12-27 NOTE — Telephone Encounter (Signed)
Called c/o constipation. Hadn't heard back from nurse. Not interested in usual recommendations. Told to call office tomorrow for an appointment

## 2014-12-28 ENCOUNTER — Encounter: Payer: Self-pay | Admitting: Gastroenterology

## 2014-12-28 ENCOUNTER — Telehealth: Payer: Self-pay | Admitting: Gastroenterology

## 2014-12-28 ENCOUNTER — Ambulatory Visit (INDEPENDENT_AMBULATORY_CARE_PROVIDER_SITE_OTHER): Payer: Commercial Managed Care - HMO | Admitting: Gastroenterology

## 2014-12-28 VITALS — BP 102/74 | HR 88 | Ht 65.5 in | Wt 155.0 lb

## 2014-12-28 DIAGNOSIS — K581 Irritable bowel syndrome with constipation: Secondary | ICD-10-CM | POA: Insufficient documentation

## 2014-12-28 DIAGNOSIS — K5900 Constipation, unspecified: Secondary | ICD-10-CM | POA: Diagnosis not present

## 2014-12-28 NOTE — Telephone Encounter (Signed)
See alternate phone note  

## 2014-12-28 NOTE — Telephone Encounter (Signed)
Appt made for today at 2 pm with Doug SouJessica Zehr, Left message on machine to call back

## 2014-12-28 NOTE — Progress Notes (Signed)
     12/28/2014 Abe PeopleStephanie Cangelosi [id number] 09/02/1980   History of Present Illness:  This is a 34 year old female known to Dr. Christella HartiganJacobs for long-standing constipation.  She underwent a colonoscopy on 03/01/2013 and was noted to have a normal mucosa in the terminal ileum, normal colon, and hemorrhoids.  Has tried Amitiza without results.  Is currently on Linzess 145 mcg daily, senokot-s two tabs at bedtime, and Miralax daily and still having constipation.  Says that sometimes she takes 4 or 5 doses of Miralax daily and still does not have a BM.  She did not have a bowel movement for 8 days, then finally had two looser stools so far today.  She says that her constipation has significantly worsened over the past 6 months.   Current Medications, Allergies, Past Medical History, Past Surgical History, Family History and Social History were reviewed in [facility] electronic medical record.   Physical Exam: BP 102/74 mmHg  Pulse 88  Ht 5' 5.5" (1.664 m)  Wt 155 lb (70.308 kg)  BMI 25.39 kg/m2  LMP 12/26/2014 General: Well developed white female in no acute distress Head: Normocephalic and atraumatic Eyes:  Sclerae anicteric, conjunctiva pink  Ears: Normal auditory acuity Lungs: Clear throughout to auscultation Heart: Regular rate and rhythm Abdomen: Soft, non-distended.  Normal bowel sounds. Non-tender. Musculoskeletal: Symmetrical with no gross deformities  Extremities: No edema  Neurological: Alert oriented x 4, grossly non-focal Psychological:  Alert and cooperative. Normal mood and affect  Assessment and Recommendations: -Constipation:  Worsening despite multiple regimens. We'll perform sitz marker study and we'll schedule anorectal manometry.  She is to hold all of her laxatives until x-ray for sitz marker study performed.  May need bowel prep next week to clean her out after x-ray performed.

## 2014-12-28 NOTE — Telephone Encounter (Signed)
Pt has returned call and is aware of the appt.  She will be here at 2 today.

## 2014-12-28 NOTE — Patient Instructions (Addendum)
You have been scheduled to have an anorectal manometry at [facility] on 01/04/15 at 9:30  am. Please arrive 30 minutes prior to your appointment time for registration (1st floor of the hospital-admissions).  Please make certain to use 1 Fleets enema 2 hours prior to coming for your appointment. You can purchase Fleets enemas from the laxative section at your drug store. You should not eat anything during the two hours prior to the procedure. You may take regular medications with small sips of water at least 2 hours prior to the study.  Anorectal manometry is a test performed to evaluate patients with constipation or fecal incontinence. This test measures the pressures of the anal sphincter muscles, the sensation in the rectum, and the neural reflexes that are needed for normal bowel movements.  THE PROCEDURE The test takes approximately 30 minutes to 1 hour. You will be asked to change into a hospital gown. A technician or nurse will explain the procedure to you, take a brief health history, and answer any questions you may have. The patient then lies on his or her left side. A small, flexible tube, about the size of a thermometer, with a balloon at the end is inserted into the rectum. The catheter is connected to a machine that measures the pressure. During the test, the small balloon attached to the catheter may be inflated in the rectum to assess the normal reflex pathways. The nurse or technician may also ask the person to squeeze, relax, and push at various times. The anal sphincter muscle pressures are measured during each of these maneuvers. To squeeze, the patient tightens the sphincter muscles as if trying to prevent anything from coming out. To push or bear down, the patient strains down as if trying to have a bowel movement.

## 2014-12-29 ENCOUNTER — Telehealth: Payer: Self-pay | Admitting: Family Medicine

## 2014-12-29 NOTE — Progress Notes (Signed)
i agree with the above note, plan 

## 2014-12-29 NOTE — Telephone Encounter (Signed)
HM updated.     KP 

## 2014-12-29 NOTE — Telephone Encounter (Signed)
[facility]

## 2015-01-02 ENCOUNTER — Other Ambulatory Visit: Payer: Self-pay | Admitting: *Deleted

## 2015-01-02 DIAGNOSIS — R194 Change in bowel habit: Secondary | ICD-10-CM

## 2015-01-02 DIAGNOSIS — K5909 Other constipation: Secondary | ICD-10-CM

## 2015-01-03 ENCOUNTER — Telehealth: Payer: Self-pay | Admitting: Gastroenterology

## 2015-01-03 ENCOUNTER — Ambulatory Visit (INDEPENDENT_AMBULATORY_CARE_PROVIDER_SITE_OTHER)
Admission: RE | Admit: 2015-01-03 | Discharge: 2015-01-03 | Disposition: A | Discharge status: Home / Self Care | Payer: Commercial Managed Care - HMO | Source: Ambulatory Visit | Attending: Gastroenterology | Admitting: Gastroenterology

## 2015-01-03 DIAGNOSIS — K5900 Constipation, unspecified: Secondary | ICD-10-CM

## 2015-01-03 NOTE — Telephone Encounter (Signed)
Angela BumpsJessica reviewed the result and states that the pt is constipated and that the markers are moving just slowly.  Pt advised to keep appt for tomorrow and further recommendations will be given at that time.

## 2015-01-03 NOTE — Telephone Encounter (Signed)
Patient states that she had xray done just now and is hoping for a callback sometime this afternoon. She states that she will be having her procedure tomorrow. Best # [phone]

## 2015-01-03 NOTE — Telephone Encounter (Signed)
The pt called and states that she is unsure of when to come in for her xray for the sitz markers.  I advised pt to come in today for her xray and have the anal rectal mano tomorrow.  Pt agreed and will come in today.

## 2015-01-04 ENCOUNTER — Encounter (HOSPITAL_COMMUNITY): Payer: Self-pay | Admitting: Anesthesiology

## 2015-01-04 ENCOUNTER — Telehealth: Payer: Self-pay | Admitting: Gastroenterology

## 2015-01-04 ENCOUNTER — Encounter (HOSPITAL_COMMUNITY)
Admission: RE | Disposition: A | Discharge status: Home / Self Care | Payer: Self-pay | Source: Ambulatory Visit | Attending: Gastroenterology

## 2015-01-04 ENCOUNTER — Ambulatory Visit (HOSPITAL_COMMUNITY)
Admission: RE | Admit: 2015-01-04 | Discharge: 2015-01-04 | Disposition: A | Discharge status: Home / Self Care | Payer: Commercial Managed Care - HMO | Source: Ambulatory Visit | Attending: Gastroenterology | Admitting: Gastroenterology

## 2015-01-04 DIAGNOSIS — K5900 Constipation, unspecified: Secondary | ICD-10-CM | POA: Diagnosis not present

## 2015-01-04 HISTORY — PX: ANAL RECTAL MANOMETRY: SHX6358

## 2015-01-04 SURGERY — MANOMETRY, ANORECTAL
Anesthesia: Choice

## 2015-01-04 MED ORDER — MOVIPREP 100 G PO SOLR: 1.0000 | Freq: Once | ORAL | Status: DC

## 2015-01-04 NOTE — Telephone Encounter (Signed)
When I called the pt back to give her the recommendations from Shanda BumpsJessica she told me that the pharmacy quoted her the wrong price and she did pick it up and will complete tomorrow.

## 2015-01-04 NOTE — Telephone Encounter (Signed)
Have her get one or 2 bottles of magnesium citrate.  Start with one and drink the second if no BM in a couple of hours.  Thank you,  Angela Bryant

## 2015-01-04 NOTE — Anesthesia Preprocedure Evaluation (Deleted)
Anesthesia Evaluation  Patient identified by MRN, date of birth, ID band Patient awake    Reviewed: Allergy & Precautions, NPO status , Patient's Chart, lab work & pertinent test results  Airway Mallampati: II  TM Distance: >3 FB Neck ROM: Full    Dental no notable dental hx.    Pulmonary asthma , Current Smoker,    Pulmonary exam normal breath sounds clear to auscultation       Cardiovascular negative cardio ROS Normal cardiovascular exam Rhythm:Regular Rate:Normal     Neuro/Psych Anxiety negative neurological ROS     GI/Hepatic Neg liver ROS, GERD  ,  Endo/Other  negative endocrine ROS  Renal/GU negative Renal ROS  negative genitourinary   Musculoskeletal negative musculoskeletal ROS (+)   Abdominal   Peds negative pediatric ROS (+)  Hematology negative hematology ROS (+)   Anesthesia Other Findings   Reproductive/Obstetrics negative OB ROS                             Anesthesia Physical Anesthesia Plan  ASA: II  Anesthesia Plan: MAC   Post-op Pain Management:    Induction: Intravenous  Airway Management Planned: Natural Airway  Additional Equipment:   Intra-op Plan:   Post-operative Plan:   Informed Consent: I have reviewed the patients History and Physical, chart, labs and discussed the procedure including the risks, benefits and alternatives for the proposed anesthesia with the patient or authorized representative who has indicated his/her understanding and acceptance.   Dental advisory given  Plan Discussed with: CRNA  Anesthesia Plan Comments:         Anesthesia Quick Evaluation

## 2015-01-04 NOTE — Telephone Encounter (Signed)
Per verbal order from Doug SouJessica Zehr PA movi prep can be sent to the pharmacy for the pt..  The pt states she has not had a bowel movement since she was seen in the office and she did have the anal rectal mano this morning.  She was advised it can take about 2 weeks for the results to be reviewed.  Pt agreed.  She will call if she has any problems with the movi prep or has no results

## 2015-01-04 NOTE — Telephone Encounter (Signed)
Angela BumpsJessica please advice pt is stating the prep is too expensive.

## 2015-01-05 ENCOUNTER — Encounter (HOSPITAL_COMMUNITY): Payer: Self-pay | Admitting: Gastroenterology

## 2015-01-11 ENCOUNTER — Telehealth: Payer: Self-pay | Admitting: Gastroenterology

## 2015-01-11 NOTE — Telephone Encounter (Signed)
Do you have the report from the anal manometry?

## 2015-01-12 ENCOUNTER — Other Ambulatory Visit: Payer: Self-pay | Admitting: Family Medicine

## 2015-01-13 NOTE — Telephone Encounter (Signed)
Have you received ano mano yet?

## 2015-01-19 ENCOUNTER — Telehealth: Payer: Self-pay | Admitting: Gastroenterology

## 2015-01-19 NOTE — Telephone Encounter (Signed)
Do I need to make her a follow up appointment? She is unsure what to do now.

## 2015-01-19 NOTE — Telephone Encounter (Signed)
She is on low-dose Linzess, can increase it to 290 g daily. and okay to schedule a follow-up visit

## 2015-01-19 NOTE — Telephone Encounter (Signed)
I have left message for the patient to call back 

## 2015-01-19 NOTE — Telephone Encounter (Signed)
Please inform patient anorectal manometry did not show any significant abnormality, was unremarkable

## 2015-01-20 ENCOUNTER — Other Ambulatory Visit: Payer: Self-pay

## 2015-01-20 MED ORDER — LINACLOTIDE 290 MCG PO CAPS: 290.0000 ug | ORAL_CAPSULE | Freq: Every day | ORAL | Status: DC

## 2015-01-20 NOTE — Telephone Encounter (Signed)
Patient will try the increase dosage of Linzess, continue the Senokot-S and daily Mira lax as previously advised. She is comfortable with staying with Dr Lavon PaganiniNandigam for her primary GI.

## 2015-01-27 NOTE — Telephone Encounter (Signed)
No earlier appointment.

## 2015-02-01 ENCOUNTER — Ambulatory Visit (INDEPENDENT_AMBULATORY_CARE_PROVIDER_SITE_OTHER): Payer: Commercial Managed Care - HMO | Admitting: Physician Assistant

## 2015-02-01 ENCOUNTER — Other Ambulatory Visit: Payer: Self-pay | Admitting: Physician Assistant

## 2015-02-01 ENCOUNTER — Encounter: Payer: Self-pay | Admitting: Physician Assistant

## 2015-02-01 VITALS — BP 108/78 | HR 90 | Temp 98.5°F | Ht 65.5 in | Wt 156.4 lb

## 2015-02-01 DIAGNOSIS — G44229 Chronic tension-type headache, not intractable: Secondary | ICD-10-CM | POA: Diagnosis not present

## 2015-02-01 MED ORDER — TIZANIDINE HCL 4 MG PO TABS: 4.0000 mg | ORAL_TABLET | Freq: Four times a day (QID) | ORAL | Status: DC | PRN

## 2015-02-01 NOTE — Patient Instructions (Addendum)
Please start the Nortriptyline taking as directed. Use Excedrin for acute headaches. Apply topical Aspercreme to the neck to help ease muscles. Take the Zanaflex each evening. Do not take at same time as Excedrin. No driving while on medication.  I want you to stop the Ambien while we are starting the Nortriptyline. The combination may make you too sleepy.  Follow-up with Dr. Laury Axon in 1-2 weeks.

## 2015-02-01 NOTE — Progress Notes (Signed)
Pre visit review using our clinic review tool, if applicable. No additional management support is needed unless otherwise documented below in the visit note. 

## 2015-02-01 NOTE — Progress Notes (Signed)
Patient presents to clinic today c/o worsening headaches over the past 5 days described as pressure and pulling, located in the back of her head. Denies increase in stress. Has history of chronic tension headaches with multiple negative workups. Is currently on Ibuprofen 800 mg as needed and Flexeril daily without relief in symptoms. Denies new symptoms. Denies photophobia, phonophobia, vision changes, nausea.  Past Medical History  Diagnosis Date  . Asthma   . GERD (gastroesophageal reflux disease)   . Insomnia   . Anxiety     Current Outpatient Prescriptions on File Prior to Visit  Medication Sig Dispense Refill  . ADVAIR DISKUS 250-50 MCG/DOSE AEPB     . ALPRAZolam (XANAX) 1 MG tablet Take 1 tablet by mouth as needed.     Marland Kitchen levocetirizine (XYZAL) 5 MG tablet Take 1 tablet by mouth at bedtime.    . montelukast (SINGULAIR) 10 MG tablet Take 1 tablet by mouth at bedtime.    . senna-docusate (SENOKOT-S) 8.6-50 MG tablet Take 2 tablets by mouth at bedtime. 60 tablet 2  . zolpidem (AMBIEN) 10 MG tablet TAKE ONE TABLET BY MOUTH AT BEDTIME 30 tablet 5  . albuterol (PROVENTIL HFA;VENTOLIN HFA) 108 (90 BASE) MCG/ACT inhaler Inhale 2 puffs into the lungs every 6 (six) hours as needed. Reported on 02/01/2015    . diltiazem 2 % GEL Apply 1 application topically 2 (two) times daily. (Patient not taking: Reported on 02/01/2015) 1 Package 1  . Linaclotide (LINZESS) 290 MCG CAPS capsule Take 1 capsule (290 mcg total) by mouth daily. (Patient not taking: Reported on 02/01/2015) 30 capsule 3  . MOVIPREP 100 G SOLR Take 1 kit (200 g total) by mouth once. (Patient not taking: Reported on 02/01/2015) 1 kit 0   No current facility-administered medications on file prior to visit.    Allergies  Allergen Reactions  . Codeine Nausea And Vomiting    Family History  Problem Relation Age of Onset  . Hypertension Father   . Asthma Mother     Social History   Social History  . Marital Status: Married   Spouse Name: N/A  . Number of Children: 0  . Years of Education: N/A   Occupational History  . t [employer]    Social History Main Topics  . Smoking status: Light Tobacco Smoker    Types: Cigarettes  . Smokeless tobacco: Never Used     Comment: rarely has a cigarrette, sometimes on weekends  . Alcohol Use: 0.0 oz/week    0 Standard drinks or equivalent per week     Comment: occasional   . Drug Use: No  . Sexual Activity:    Partners: Male   Other Topics Concern  . None   Social History Narrative   Review of Systems - See HPI.  All other ROS are negative.  BP 108/78 mmHg  Pulse 90  Temp(Src) 98.5 F (36.9 C) (Oral)  Ht 5' 5.5" (1.664 m)  Wt 156 lb 6.4 oz (70.943 kg)  BMI 25.62 kg/m2  SpO2 99%  LMP 01/24/2015  Physical Exam  Constitutional: She is oriented to person, place, and time and well-developed, well-nourished, and in no distress.  HENT:  Head: Normocephalic and atraumatic.  Eyes: Conjunctivae are normal.  Neck: Neck supple. Muscular tenderness present.  Cardiovascular: Normal rate, regular rhythm, normal heart sounds and intact distal pulses.   Pulmonary/Chest: Effort normal and breath sounds normal. No respiratory distress. She has no wheezes. She has no rales. She exhibits no tenderness.  Neurological: She is alert and oriented to person, place, and time.  Skin: Skin is warm and dry. No rash noted.  Psychiatric: Affect normal.  Vitals reviewed.  No results found for this or any previous visit (from the past 2160 hour(s)).  Assessment/Plan: Chronic tension-type headache, not intractable Discussed prophylactic treatment. Patient given Nortriptyline by PCP previously but has not taken. Discussed benefit of medication -- headache relief and restful sleep. Patient is willing to begin. Flexeril switched for Zanaflex. Patient recommended to use Excedrin instead of plain Ibuprofen. Follow-up 1-2 weeks.

## 2015-02-01 NOTE — Assessment & Plan Note (Addendum)
Discussed prophylactic treatment. Patient given Nortriptyline by PCP previously but has not taken. Discussed benefit of medication -- headache relief and restful sleep. Patient is willing to begin. Flexeril switched for Zanaflex to use each evening. Follow-up 1-2 weeks.

## 2015-02-24 ENCOUNTER — Telehealth: Payer: Self-pay | Admitting: Gastroenterology

## 2015-02-24 NOTE — Telephone Encounter (Signed)
Calls and reports her stomach started hurting at work and she had to leave because she couldn't have a bowel movement. She was crying and shaking because of her pain. She used a Fleets enema. She was sweating and hurting but had a bowel movement that was very painful. She said it had been 6 or 7 days since her last bowel movement. Patient states her daily meds for her bowels are a glass of Miralax daily, Linzess and a stool softener. She then states she was on a cruise and did not have a bowel movement for 8 days or 9 days. She takes Senna-S sometimes, but didn't clearly state she had taken it recently. She is scheduled for follow up on 03/01/15, but she wants something done now. How would you want her to proceed at this point?

## 2015-02-27 NOTE — Telephone Encounter (Signed)
Patient advised. She reports she has had a virus this weekend and has "emptied out". She wants to talk with her provider about hemorrhoids also. She will bring it up at her visit.

## 2015-02-27 NOTE — Telephone Encounter (Signed)
Please have her do a bowel purge with Mag citrate 1 bottle. Thanks

## 2015-03-01 ENCOUNTER — Telehealth: Payer: Self-pay | Admitting: Gastroenterology

## 2015-03-01 ENCOUNTER — Ambulatory Visit (INDEPENDENT_AMBULATORY_CARE_PROVIDER_SITE_OTHER): Payer: Commercial Managed Care - HMO | Admitting: Gastroenterology

## 2015-03-01 ENCOUNTER — Encounter: Payer: Self-pay | Admitting: Gastroenterology

## 2015-03-01 VITALS — BP 102/74 | HR 80 | Ht 65.5 in | Wt 156.2 lb

## 2015-03-01 DIAGNOSIS — K602 Anal fissure, unspecified: Secondary | ICD-10-CM

## 2015-03-01 DIAGNOSIS — K5900 Constipation, unspecified: Secondary | ICD-10-CM | POA: Diagnosis not present

## 2015-03-01 DIAGNOSIS — K5909 Other constipation: Secondary | ICD-10-CM

## 2015-03-01 MED ORDER — NITROGLYCERIN 0.4 % RE OINT: 1.0000 "application " | TOPICAL_OINTMENT | Freq: Every day | RECTAL | Status: DC

## 2015-03-01 NOTE — Telephone Encounter (Signed)
Dr Lavon Paganini, Patient called and stated that the Nitroglycerin ointment is $50. Is there anything she can take that is cheaper?

## 2015-03-01 NOTE — Patient Instructions (Signed)
We will refill your Linzess today Use Miralax 2 capfuls twice daily Use Senakot 2 pills at bedtime Mag Citrate 1 bottle as needed , you can purchase over the counter as needed We will contact you to schedule your Smart Pill procedure as soon as it becomes available

## 2015-03-01 NOTE — Progress Notes (Addendum)
Angela Bryant    [id number]    1980-12-07  Primary Care Physician:Yvonne Etter Sjogren, DO  Referring Physician: Rosalita Chessman, DO [address] [place], [place] [address]  Chief complaint:  Chronic constipation, rectal pain  HPI: 35 year old female with history of chronic constipation on Linzess 290 g daily, MiraLAX, Colace and senna with no bowel movements for as long as 7-10 days unless she does an enema. She also complains of severe pain in the rectum every time she has a bowel movement and tries to avoid having a bowel movement. She also has intermittent bright red blood per rectum. She underwent a colonoscopy in 06/02/2013 that was normal. Anorectal manometry did not show any evidence of dyssynergia defecation. She has tried hematochezia in the past with no improvement. Denies any nausea or vomiting. She does have intermittent heartburn and bloating. Sits marker study showed retained markers in the left colon.    Outpatient Encounter Prescriptions as of 03/01/2015  Medication Sig  . ADVAIR DISKUS 250-50 MCG/DOSE AEPB   . albuterol (PROVENTIL HFA;VENTOLIN HFA) 108 (90 BASE) MCG/ACT inhaler Inhale 2 puffs into the lungs every 6 (six) hours as needed. Reported on 02/01/2015  . ALPRAZolam (XANAX) 1 MG tablet Take 1 tablet by mouth as needed.   Marland Kitchen aspirin-acetaminophen-caffeine (EXCEDRIN EXTRA STRENGTH) 250-250-65 MG tablet Take 1 tablet by mouth every 6 (six) hours as needed for headache.  . diltiazem 2 % GEL Apply 1 application topically 2 (two) times daily.  Marland Kitchen ibuprofen (ADVIL,MOTRIN) 800 MG tablet Take 1 tablet by mouth as needed.  Marland Kitchen levocetirizine (XYZAL) 5 MG tablet Take 1 tablet by mouth at bedtime.  . Linaclotide (LINZESS) 290 MCG CAPS capsule Take 1 capsule (290 mcg total) by mouth daily.  . montelukast (SINGULAIR) 10 MG tablet Take 1 tablet by mouth at bedtime.  Marland Kitchen MOVIPREP 100 G SOLR Take 1 kit (200 g total) by mouth once.  . nortriptyline (PAMELOR) 10 MG  capsule Take 1 capsule (10 mg total) by mouth at bedtime.  . senna-docusate (SENOKOT-S) 8.6-50 MG tablet Take 2 tablets by mouth at bedtime.  Marland Kitchen tiZANidine (ZANAFLEX) 4 MG tablet Take 1 tablet (4 mg total) by mouth every 6 (six) hours as needed for muscle spasms.  Marland Kitchen zolpidem (AMBIEN) 10 MG tablet TAKE ONE TABLET BY MOUTH AT BEDTIME  . Nitroglycerin 0.4 % OINT Place 1 application rectally at bedtime.   No facility-administered encounter medications on file as of 03/01/2015.    Allergies as of 03/01/2015 - Review Complete 03/01/2015  Allergen Reaction Noted  . Codeine Nausea And Vomiting 07/02/2011    Past Medical History  Diagnosis Date  . Asthma   . GERD (gastroesophageal reflux disease)   . Insomnia   . Anxiety     Past Surgical History  Procedure Laterality Date  . Anal rectal manometry N/A 01/04/2015    Procedure: ANO RECTAL MANOMETRY;  Surgeon: Mauri Pole, MD;  Location: [facility];  Service: Endoscopy;  Laterality: N/A;    Family History  Problem Relation Age of Onset  . Hypertension Father   . Asthma Mother     Social History   Social History  . Marital Status: Married    Spouse Name: N/A  . Number of Children: 0  . Years of Education: N/A   Occupational History  . t [employer]    Social History Main Topics  . Smoking status: Light Tobacco Smoker  Types: Cigarettes  . Smokeless tobacco: Never Used     Comment: rarely has a cigarrette, sometimes on weekends  . Alcohol Use: 0.0 oz/week    0 Standard drinks or equivalent per week     Comment: occasional   . Drug Use: No  . Sexual Activity:    Partners: Male   Other Topics Concern  . Not on file   Social History Narrative      Review of systems: Review of Systems  Constitutional: Negative for fever and chills.  HENT: Negative.   Eyes: Negative for blurred vision.  Respiratory: Negative for cough, shortness of breath and wheezing.   Cardiovascular: Negative for chest pain and palpitations.   Gastrointestinal: as per HPI Genitourinary: Negative for dysuria, urgency, frequency and hematuria.  Musculoskeletal: Positive for myalgias, back pain and joint pain.  Skin: Negative for itching and rash.  Neurological: Negative for dizziness, tremors, focal weakness, seizures and loss of consciousness.  positive for headache Endo/Heme/Allergies: Negative for environmental allergies.  Psychiatric/Behavioral: Negative for depression, suicidal ideas and hallucinations.  positive for anxiety and sleep problems All other systems reviewed and are negative.   Physical Exam: Filed Vitals:   03/01/15 1039  BP: 102/74  Pulse: 80   Gen:      No acute distress HEENT:  EOMI, sclera anicteric Neck:     No masses; no thyromegaly Lungs:    Clear to auscultation bilaterally; normal respiratory effort CV:         Regular rate and rhythm; no murmurs Abd:      + bowel sounds; soft, non-tender; no palpable masses, no distension Ext:    No edema; adequate peripheral perfusion Skin:      Warm and dry; no rash Neuro: alert and oriented x 3 Psych: normal mood and affect Rectal exam and anoscopy: Anterior anal fissure, 1 small internal hemorrhoid at 3'o clock. Minimal amount of blood from fissure on anoscope, normal dentate line, no visible nodules  Data Reviewed:  Sitz Marker study 01/03/2015 There are 8 Sitz markers visible centered over the distal descending colon as well as in the rectosigmoid. There are also radiodense tablets present.  Increased colonic stool burden diffusely is consistent with clinical constipation.   Assessment and Plan/Recommendations:  35 year old female with chronic constipation here for follow-up visit On exam patient has anal fissure Instructed to apply rectal nitroglycerin 0.4% at bedtime daily for 2 weeks Continue linzess 290 g daily, along with MiraLAX 2 capfuls twice a day and senna 2 tablets at bedtime We will schedule for smart pill to determine whole gut  transit Return in 2 months  K. Denzil Magnuson , MD [phone] Mon-Fri 8a-5p [phone] after 5p, weekends, holidays

## 2015-03-02 NOTE — Telephone Encounter (Signed)
She could do diltiazem 2% gel or Nifedipine gel. Not sure if they will be covered by her insurance.

## 2015-03-03 NOTE — Telephone Encounter (Signed)
You have been scheduled for a test called Smart Pill.  This is a test that evaluates the motility of your GI tract.  You will eat a SmartBar meal bar then swallow a SmartPill. After ingesting the SmartPilll you will be asked to fast again for 6 hours.  You will wear a recorder that receives data from the capsule for up to 5 days.  You will need to wear the recorder at all times for the duration of the test except while bathing or sleeping.  Once you pass the capsule you will return the recorder box to [facility].   Please arrive at [facility] admitting at  04/10/2015  At 7:30am arrive at 7:15am Do not have anything to eat or drink after midnight the morning of your test. You will be provided specific instructions at [facility] regarding activity and dietary restrictions for the remainder of the procedure.    You will need to hold the following medications prior to your procedure day and for the duration of your test:         7 days prior hold Nexium, Prilosec, Prevacid, Dexilant, Omeprazole, Aciphex,                  Protonix, or Pantoprazole.         3 days prior hold Axid, Pepcid, Zantac, or Ranitidine        3 days prior hold Reglan, Metoclopramide, or Domperidone       3 days prior hold Dicyclomine, Hyoscyamine, Bentyl, Robinul, Glycopyrrolate,                  Hyomax        3 days prior hold any antiemetics Phenergan, Promethazine, Zofran,                                Ondansetron.       3 days prior hold all antidiarrheal agents        3 days prior hold all narcotic pain medications and NSAIDS      2 days prior hold all laxatives       1 day prior hold all Antacids: TUMS, Gaviscon, Rolaids

## 2015-03-15 ENCOUNTER — Telehealth: Payer: Self-pay | Admitting: Family Medicine

## 2015-03-15 ENCOUNTER — Ambulatory Visit: Payer: Commercial Managed Care - HMO | Admitting: Gastroenterology

## 2015-03-15 NOTE — Telephone Encounter (Signed)
This medication was prescribed by a provider other than Dr. Laury Axon in 2014. Pt will need OV for refills if Dr. Laury Axon is to prescribe. Please call pt to schedule appt.

## 2015-03-15 NOTE — Telephone Encounter (Signed)
Caller name: Self  Can be reached: [phone]  Pharmacy:  [facility] - [place], [place] - [phone] (Phone) [phone] (Fax)       Reason for call: Refill zolpidem (AMBIEN) 10 MG tablet [[id number]]

## 2015-03-15 NOTE — Telephone Encounter (Signed)
Pt called back states when she saw Dr. Laury Axon sept 12. 2016 she had spoken with her about being able to refill the rx states Dr. Laury Axon informed her that she would refill just to call and let her know when she needed the rx.

## 2015-03-16 MED ORDER — ZOLPIDEM TARTRATE 10 MG PO TABS: 10.0000 mg | ORAL_TABLET | Freq: Every day | ORAL | Status: DC

## 2015-03-16 NOTE — Telephone Encounter (Signed)
Refill x1 

## 2015-03-16 NOTE — Telephone Encounter (Signed)
Last seen 09/26/14 . Please advise    KP

## 2015-03-16 NOTE — Telephone Encounter (Signed)
Rx faxed.    KP 

## 2015-03-23 ENCOUNTER — Other Ambulatory Visit: Payer: Self-pay | Admitting: *Deleted

## 2015-03-23 DIAGNOSIS — K5900 Constipation, unspecified: Secondary | ICD-10-CM

## 2015-03-23 NOTE — Telephone Encounter (Signed)
Letter/Instructions mailed to patient of date and time and instructions of her test on 04/10/2015.

## 2015-03-27 ENCOUNTER — Other Ambulatory Visit: Payer: Self-pay | Admitting: Family Medicine

## 2015-03-28 ENCOUNTER — Telehealth: Payer: Self-pay | Admitting: Family Medicine

## 2015-03-28 NOTE — Telephone Encounter (Signed)
[facility] pending MD signature.     KP

## 2015-03-28 NOTE — Telephone Encounter (Signed)
Pt called in to check the status of her medication. Informed pt of the below. Pt says that she is not familiar with the pharmacy that Rx was sent to and would like to have it sent to [facility] [address] [facility]. ALSO pt would like a refill on her FLEXERIL Rx for her headaches as well.    CB: [phone]

## 2015-03-28 NOTE — Telephone Encounter (Signed)
Pt is requesting cyclobenzaprine refill. Pt last seen by PCP 09/26/14.  Please advise?

## 2015-03-31 ENCOUNTER — Telehealth: Payer: Self-pay | Admitting: Gastroenterology

## 2015-03-31 NOTE — Telephone Encounter (Signed)
Okay to cancel Smart Pill?

## 2015-03-31 NOTE — Telephone Encounter (Signed)
Ok to cancel

## 2015-03-31 NOTE — Telephone Encounter (Signed)
Contacted Endoscopy and cancelled the Smart Pill.

## 2015-04-05 ENCOUNTER — Other Ambulatory Visit: Payer: Self-pay | Admitting: Family Medicine

## 2015-04-10 ENCOUNTER — Encounter (HOSPITAL_COMMUNITY): Admission: RE | Payer: Self-pay | Source: Ambulatory Visit

## 2015-04-10 ENCOUNTER — Ambulatory Visit (HOSPITAL_COMMUNITY)
Admission: RE | Admit: 2015-04-10 | Payer: Commercial Managed Care - HMO | Source: Ambulatory Visit | Admitting: Gastroenterology

## 2015-04-10 SURGERY — CAPSULE ENDOSCOPY, USING SMARTPILL MOTILITY TESTING SYSTEM

## 2015-04-14 NOTE — Telephone Encounter (Signed)
See Previous Encounter

## 2015-04-29 ENCOUNTER — Other Ambulatory Visit: Payer: Self-pay | Admitting: Family Medicine

## 2015-05-01 ENCOUNTER — Telehealth: Payer: Self-pay | Admitting: Family Medicine

## 2015-05-01 NOTE — Telephone Encounter (Signed)
Rx faxed.    KP 

## 2015-05-01 NOTE — Telephone Encounter (Signed)
Caller name: Self  Can be reached: [phone]   Pharmacy:  [facility] [place] - [address] [phone] (Phone) [phone] (Fax)         Reason for call: Patient request Ambien refill

## 2015-05-01 NOTE — Telephone Encounter (Signed)
Last seen 09/26/14 and filled 3/2/217 #30   Please advise     KP

## 2015-05-23 ENCOUNTER — Other Ambulatory Visit: Payer: Self-pay | Admitting: Family Medicine

## 2015-05-23 MED ORDER — ZOLPIDEM TARTRATE 10 MG PO TABS: 10.0000 mg | ORAL_TABLET | Freq: Every day | ORAL | Status: DC

## 2015-05-23 NOTE — Telephone Encounter (Signed)
Caller name: Self  Can be reached: [phone]    Reason for call: Refill zolpidem (AMBIEN) 10 MG tablet [[id number]][[id number]]

## 2015-05-23 NOTE — Telephone Encounter (Signed)
Last seen 09/26/14 and filled 05/01/15 #30   Please advise    KP

## 2015-06-01 ENCOUNTER — Other Ambulatory Visit: Payer: Self-pay | Admitting: Family Medicine

## 2015-06-02 ENCOUNTER — Other Ambulatory Visit: Payer: Self-pay | Admitting: Emergency Medicine

## 2015-06-26 ENCOUNTER — Other Ambulatory Visit: Payer: Self-pay | Admitting: Family Medicine

## 2015-06-26 NOTE — Telephone Encounter (Signed)
Last zolpidem Rx: 05/23/15, #30 Last OV: 09/2014 Next OV: none scheduled.  Rx printed and forwarded to PCP for signature. Please advise when pt should follow up with you in the office?

## 2015-06-27 NOTE — Telephone Encounter (Signed)
Angela Bryant-- please call pt to schedule follow up with Dr [name] in September or October. Thanks!   Donato SchultzYvonne R Angela Chase, DO   Sent: Mon June 26, 2015 8:51 PM    To: Kathi Simpersricia A Rehanna Oloughlin, CMA        Message     Ok to refill x1------ 1 refill-----f/u in sept/ oct   Rx called to pharmacy voicemail.

## 2015-06-27 NOTE — Telephone Encounter (Signed)
Left message on patient's VM informing her to call the office to schedule follow up with provider.

## 2015-07-04 ENCOUNTER — Other Ambulatory Visit: Payer: Self-pay | Admitting: Family Medicine

## 2015-07-04 NOTE — Telephone Encounter (Signed)
Last seen 09/26/14 and filled 06/01/15 #30   Please advise    KP

## 2015-07-26 ENCOUNTER — Other Ambulatory Visit: Payer: Self-pay | Admitting: Family Medicine

## 2015-07-27 NOTE — Telephone Encounter (Signed)
Last seen 09/26/14 and filled 06/26/15 #30  Please advise      KP

## 2015-07-27 NOTE — Telephone Encounter (Signed)
Pt called in because she said that she is completely out of her medication.

## 2015-08-03 NOTE — Telephone Encounter (Signed)
Completed.

## 2015-08-05 ENCOUNTER — Other Ambulatory Visit: Payer: Self-pay | Admitting: Family Medicine

## 2015-08-07 NOTE — Telephone Encounter (Signed)
Last cyclobenzaprine #: 03/17/15, #30 Last OV: 09/2014 Next OV: none scheduled  Please advise request.

## 2015-08-14 ENCOUNTER — Ambulatory Visit (INDEPENDENT_AMBULATORY_CARE_PROVIDER_SITE_OTHER): Payer: Commercial Managed Care - HMO | Admitting: Family Medicine

## 2015-08-14 ENCOUNTER — Encounter: Payer: Self-pay | Admitting: Family Medicine

## 2015-08-14 VITALS — BP 116/80 | HR 86 | Temp 99.0°F | Ht 66.0 in | Wt 156.6 lb

## 2015-08-14 DIAGNOSIS — G4700 Insomnia, unspecified: Secondary | ICD-10-CM

## 2015-08-14 DIAGNOSIS — J302 Other seasonal allergic rhinitis: Secondary | ICD-10-CM

## 2015-08-14 DIAGNOSIS — K219 Gastro-esophageal reflux disease without esophagitis: Secondary | ICD-10-CM

## 2015-08-14 MED ORDER — FLUTICASONE PROPIONATE 50 MCG/ACT NA SUSP: 2.0000 | Freq: Every day | NASAL | 6 refills | Status: DC

## 2015-08-14 MED ORDER — ZOLPIDEM TARTRATE 10 MG PO TABS: 10.0000 mg | ORAL_TABLET | Freq: Every day | ORAL | 2 refills | Status: DC

## 2015-08-14 MED ORDER — OMEPRAZOLE 40 MG PO CPDR
40.0000 mg | DELAYED_RELEASE_CAPSULE | Freq: Every day | ORAL | 3 refills | Status: DC
Start: 2015-08-14 — End: 2017-09-08

## 2015-08-14 NOTE — Patient Instructions (Signed)
Insomnia Insomnia is a sleep disorder that makes it difficult to fall asleep or to stay asleep. Insomnia can cause tiredness (fatigue), low energy, difficulty concentrating, mood swings, and poor performance at work or school.  There are three different ways to classify insomnia:  Difficulty falling asleep.  Difficulty staying asleep.  Waking up too early in the morning. Any type of insomnia can be long-term (chronic) or short-term (acute). Both are common. Short-term insomnia usually lasts for three months or less. Chronic insomnia occurs at least three times a week for longer than three months. CAUSES  Insomnia may be caused by another condition, situation, or substance, such as:  Anxiety.  Certain medicines.  Gastroesophageal reflux disease (GERD) or other gastrointestinal conditions.  Asthma or other breathing conditions.  Restless legs syndrome, sleep apnea, or other sleep disorders.  Chronic pain.  Menopause. This may include hot flashes.  Stroke.  Abuse of alcohol, tobacco, or illegal drugs.  Depression.  Caffeine.   Neurological disorders, such as Alzheimer disease.  An overactive thyroid (hyperthyroidism). The cause of insomnia may not be known. RISK FACTORS Risk factors for insomnia include:  Gender. Women are more commonly affected than men.  Age. Insomnia is more common as you get older.  Stress. This may involve your professional or personal life.  Income. Insomnia is more common in people with lower income.  Lack of exercise.   Irregular work schedule or night shifts.  Traveling between different time zones. SIGNS AND SYMPTOMS If you have insomnia, trouble falling asleep or trouble staying asleep is the main symptom. This may lead to other symptoms, such as:  Feeling fatigued.  Feeling nervous about going to sleep.  Not feeling rested in the morning.  Having trouble concentrating.  Feeling irritable, anxious, or depressed. TREATMENT   Treatment for insomnia depends on the cause. If your insomnia is caused by an underlying condition, treatment will focus on addressing the condition. Treatment may also include:   Medicines to help you sleep.  Counseling or therapy.  Lifestyle adjustments. HOME CARE INSTRUCTIONS   Take medicines only as directed by your health care provider.  Keep regular sleeping and waking hours. Avoid naps.  Keep a sleep diary to help you and your health care provider figure out what could be causing your insomnia. Include:   When you sleep.  When you wake up during the night.  How well you sleep.   How rested you feel the next day.  Any side effects of medicines you are taking.  What you eat and drink.   Make your bedroom a comfortable place where it is easy to fall asleep:  Put up shades or special blackout curtains to block light from outside.  Use a white noise machine to block noise.  Keep the temperature cool.   Exercise regularly as directed by your health care provider. Avoid exercising right before bedtime.  Use relaxation techniques to manage stress. Ask your health care provider to suggest some techniques that may work well for you. These may include:  Breathing exercises.  Routines to release muscle tension.  Visualizing peaceful scenes.  Cut back on alcohol, caffeinated beverages, and cigarettes, especially close to bedtime. These can disrupt your sleep.  Do not overeat or eat spicy foods right before bedtime. This can lead to digestive discomfort that can make it hard for you to sleep.  Limit screen use before bedtime. This includes:  Watching TV.  Using your smartphone, tablet, and computer.  Stick to a routine. This   can help you fall asleep faster. Try to do a quiet activity, brush your teeth, and go to bed at the same time each night.  Get out of bed if you are still awake after 15 minutes of trying to sleep. Keep the lights down, but try reading or  doing a quiet activity. When you feel sleepy, go back to bed.  Make sure that you drive carefully. Avoid driving if you feel very sleepy.  Keep all follow-up appointments as directed by your health care provider. This is important. SEEK MEDICAL CARE IF:   You are tired throughout the day or have trouble in your daily routine due to sleepiness.  You continue to have sleep problems or your sleep problems get worse. SEEK IMMEDIATE MEDICAL CARE IF:   You have serious thoughts about hurting yourself or someone else.   This information is not intended to replace advice given to you by your health care provider. Make sure you discuss any questions you have with your health care provider.   Document Released: 12/29/1999 Document Revised: 09/21/2014 Document Reviewed: 10/01/2013 Elsevier Interactive Patient Education 2016 Elsevier Inc.  

## 2015-08-14 NOTE — Assessment & Plan Note (Addendum)
Omeprazole 40 mg qd  gerd H.O. Refer to GI if no relief from omeprazole after 6-8 weeks

## 2015-08-14 NOTE — Progress Notes (Signed)
Patient ID: Angela Bryant, female    DOB: Jul 30, 1980  Age: 35 y.o. MRN: [id number]    Subjective:  Subjective  HPI Angela Bryant presents for f/u allergies, insomnia and c/o gerd symptoms.  She has had gerd for years and has been off her rx meds for a while..  Review of Systems  Constitutional: Negative for appetite change, diaphoresis, fatigue and unexpected weight change.  Eyes: Negative for pain, redness and visual disturbance.  Respiratory: Negative for cough, chest tightness, shortness of breath and wheezing.   Cardiovascular: Negative for chest pain, palpitations and leg swelling.  Endocrine: Negative for cold intolerance, heat intolerance, polydipsia, polyphagia and polyuria.  Genitourinary: Negative for difficulty urinating, dysuria and frequency.  Neurological: Negative for dizziness, light-headedness, numbness and headaches.  Psychiatric/Behavioral: Negative for agitation, dysphoric mood, self-injury, sleep disturbance and suicidal ideas. The patient is not nervous/anxious.     History Past Medical History:  Diagnosis Date  . Anxiety   . Asthma   . GERD (gastroesophageal reflux disease)   . Insomnia     She has a past surgical history that includes Anal Rectal manometry (N/A, 01/04/2015).   Her family history includes Asthma in her mother; Hypertension in her father.She reports that she has been smoking Cigarettes.  She has never used smokeless tobacco. She reports that she drinks alcohol. She reports that she does not use drugs.  Current Outpatient Prescriptions on File Prior to Visit  Medication Sig Dispense Refill  . ADVAIR DISKUS 250-50 MCG/DOSE AEPB     . albuterol (PROVENTIL HFA;VENTOLIN HFA) 108 (90 BASE) MCG/ACT inhaler Inhale 2 puffs into the lungs every 6 (six) hours as needed. Reported on 02/01/2015    . aspirin-acetaminophen-caffeine (EXCEDRIN EXTRA STRENGTH) 250-250-65 MG tablet Take 1 tablet by mouth every 6 (six) hours as needed for headache. 30  tablet 0  . cyclobenzaprine (FLEXERIL) 10 MG tablet TAKE ONE TABLET BY MOUTH THREE TIMES DAILY AS NEEDED FOR MUSCLE SPASM 30 tablet 0  . ibuprofen (ADVIL,MOTRIN) 800 MG tablet Take 1 tablet by mouth as needed.    Marland Kitchen levocetirizine (XYZAL) 5 MG tablet Take 1 tablet by mouth at bedtime.    . Linaclotide (LINZESS) 290 MCG CAPS capsule Take 1 capsule (290 mcg total) by mouth daily. 30 capsule 3  . montelukast (SINGULAIR) 10 MG tablet Take 1 tablet by mouth at bedtime.    Marland Kitchen tiZANidine (ZANAFLEX) 4 MG tablet Take 1 tablet (4 mg total) by mouth every 6 (six) hours as needed for muscle spasms. 60 tablet 0   No current facility-administered medications on file prior to visit.      Objective:  Objective  Physical Exam  Constitutional: She is oriented to person, place, and time. She appears well-developed and well-nourished.  HENT:  Head: Normocephalic and atraumatic.  Eyes: Conjunctivae and EOM are normal.  Neck: Normal range of motion. Neck supple. No JVD present. Carotid bruit is not present. No thyromegaly present.  Cardiovascular: Normal rate, regular rhythm and normal heart sounds.   No murmur heard. Pulmonary/Chest: Effort normal and breath sounds normal. No respiratory distress. She has no wheezes. She has no rales. She exhibits no tenderness.  Musculoskeletal: She exhibits no edema.  Neurological: She is alert and oriented to person, place, and time.  Psychiatric: She has a normal mood and affect. Her behavior is normal. Judgment and thought content normal.  Nursing note and vitals reviewed.  BP 116/80 (BP Location: Left Arm, Patient Position: Sitting, Cuff Size: Normal)   Pulse 86  Temp 99 F (37.2 C) (Oral)   Ht 5\' 6"  (1.676 m)   Wt 156 lb 9.6 oz (71 kg)   LMP 07/24/2015 (Exact Date)   SpO2 98%   BMI 25.28 kg/m  Wt Readings from Last 3 Encounters:  08/14/15 156 lb 9.6 oz (71 kg)  03/01/15 156 lb 3.2 oz (70.9 kg)  02/01/15 156 lb 6.4 oz (70.9 kg)     Lab Results  Component  Value Date   WBC 7.8 12/21/2013   HGB 11.8 (L) 12/21/2013   HCT 36.4 12/21/2013   PLT 219.0 12/21/2013   GLUCOSE 89 12/21/2013   ALT 14 12/21/2013   AST 17 12/21/2013   NA 137 12/21/2013   K 4.4 12/21/2013   CL 105 12/21/2013   CREATININE 0.7 12/21/2013   BUN 9 12/21/2013   CO2 25 12/21/2013   TSH 0.85 12/23/2012    Dg Abd 1 View  Result Date: 01/03/2015 CLINICAL DATA:  Six months of chronic constipation, history of irritable bowel disease, Sitz marker study. EXAM: ABDOMEN - 1 VIEW COMPARISON:  No recent examinations in PACs FINDINGS: There are 8 Sitz markers centered over the mid and left aspect of the pelvis. Radiodense tablets project over the left sacral ale a in the left iliac crest. The colonic stool burden is increased. There is no small or large bowel obstructive pattern. There are no abnormal soft tissue calcifications. The bony structures are unremarkable. IMPRESSION: There are 8 Sitz markers visible centered over the distal descending colon as well as in the rectosigmoid. There are also radiodense tablets present. Increased colonic stool burden diffusely is consistent with clinical constipation. Electronically Signed   By: David  [place] M.D.   On: 01/03/2015 14:01     Assessment & Plan:  Plan  I have discontinued Ms. Waldren's ALPRAZolam, diltiazem, senna-docusate, MOVIPREP, nortriptyline, and Nitroglycerin. I have also changed her zolpidem. Additionally, I am having her start on omeprazole and fluticasone. Lastly, I am having her maintain her albuterol, ADVAIR DISKUS, montelukast, levocetirizine, linaclotide, ibuprofen, tiZANidine, aspirin-acetaminophen-caffeine, and cyclobenzaprine.  Meds ordered this encounter  Medications  . zolpidem (AMBIEN) 10 MG tablet    Sig: Take 1 tablet (10 mg total) by mouth at bedtime.    Dispense:  30 tablet    Refill:  2  . omeprazole (PRILOSEC) 40 MG capsule    Sig: Take 1 capsule (40 mg total) by mouth daily.    Dispense:  90 capsule     Refill:  3  . fluticasone (FLONASE) 50 MCG/ACT nasal spray    Sig: Place 2 sprays into both nostrils daily.    Dispense:  16 g    Refill:  6    Problem List Items Addressed This Visit      Unprioritized   GERD    Omeprazole 40 mg qd  gerd H.O. Refer to GI if no relief from omeprazole after 6-8 weeks      Relevant Medications   omeprazole (PRILOSEC) 40 MG capsule    Other Visit Diagnoses    Insomnia    -  Primary   Relevant Medications   zolpidem (AMBIEN) 10 MG tablet   Seasonal allergies       Relevant Medications   fluticasone (FLONASE) 50 MCG/ACT nasal spray      Follow-up: No Follow-up on file.  Donato Schultz, DO

## 2015-08-14 NOTE — Progress Notes (Signed)
Pre visit review using our clinic review tool, if applicable. No additional management support is needed unless otherwise documented below in the visit note. 

## 2015-09-06 ENCOUNTER — Other Ambulatory Visit: Payer: Self-pay | Admitting: Family Medicine

## 2015-09-19 ENCOUNTER — Ambulatory Visit (INDEPENDENT_AMBULATORY_CARE_PROVIDER_SITE_OTHER): Payer: Commercial Managed Care - HMO | Admitting: Physician Assistant

## 2015-09-19 ENCOUNTER — Encounter: Payer: Self-pay | Admitting: Physician Assistant

## 2015-09-19 VITALS — BP 130/86 | HR 86 | Temp 98.0°F | Resp 16 | Ht 66.0 in | Wt 157.5 lb

## 2015-09-19 DIAGNOSIS — J029 Acute pharyngitis, unspecified: Secondary | ICD-10-CM | POA: Diagnosis not present

## 2015-09-19 NOTE — Progress Notes (Signed)
Patient presents to clinic today c/o 2 weeks of sore throat with odynophagia.   Has been to Urgent Care twice, most recently last Thursday. At first visit had negative rapid strep and throat culture. Was given a steroid shot with initial great improvement but symptoms recurred. Has noted a dry cough over the past couple of days. Denies fever, chills or aches. + fatigue.  Denies sick contact. Is currently a smoker, averaging a few cigarettes per day.  + history of GERD with current symptoms. Is currently on Prilosec 40 mg daily with some relief of symptoms.   Past Medical History:  Diagnosis Date  . Anxiety   . Asthma   . GERD (gastroesophageal reflux disease)   . Insomnia     Current Outpatient Prescriptions on File Prior to Visit  Medication Sig Dispense Refill  . ADVAIR DISKUS 250-50 MCG/DOSE AEPB Inhale 1 puff into the lungs at bedtime.     Marland Kitchen albuterol (PROVENTIL HFA;VENTOLIN HFA) 108 (90 BASE) MCG/ACT inhaler Inhale 2 puffs into the lungs every 6 (six) hours as needed. Reported on 02/01/2015    . aspirin-acetaminophen-caffeine (EXCEDRIN EXTRA STRENGTH) 250-250-65 MG tablet Take 1 tablet by mouth every 6 (six) hours as needed for headache. 30 tablet 0  . cyclobenzaprine (FLEXERIL) 10 MG tablet TAKE ONE TABLET BY MOUTH THREE TIMES DAILY AS NEEDED FOR MUSCLE SPASM 30 tablet 0  . fluticasone (FLONASE) 50 MCG/ACT nasal spray Place 2 sprays into both nostrils daily. 16 g 6  . ibuprofen (ADVIL,MOTRIN) 800 MG tablet Take 1 tablet by mouth as needed.    Marland Kitchen levocetirizine (XYZAL) 5 MG tablet Take 1 tablet by mouth at bedtime.    . Linaclotide (LINZESS) 290 MCG CAPS capsule Take 1 capsule (290 mcg total) by mouth daily. (Patient taking differently: Take 290 mcg by mouth. ) 30 capsule 3  . montelukast (SINGULAIR) 10 MG tablet Take 1 tablet by mouth at bedtime.    Marland Kitchen omeprazole (PRILOSEC) 40 MG capsule Take 1 capsule (40 mg total) by mouth daily. 90 capsule 3  . zolpidem (AMBIEN) 10 MG tablet Take 1  tablet (10 mg total) by mouth at bedtime. 30 tablet 2   No current facility-administered medications on file prior to visit.     Allergies  Allergen Reactions  . Codeine Nausea And Vomiting    Family History  Problem Relation Age of Onset  . Hypertension Father   . Asthma Mother     Social History   Social History  . Marital status: Married    Spouse name: N/A  . Number of children: 0  . Years of education: N/A   Occupational History  . 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 Smoking status: Light Tobacco Smoker    Types: Cigarettes  . Smokeless tobacco: Never Used     Comment: rarely has a cigarrette, sometimes on weekends  . Alcohol use 0.0 oz/week     Comment: occasional   . Drug use: No  . Sexual activity: Yes    Partners: Male   Other Topics Concern  . None   Social History Narrative  . None    Review of Systems - See HPI.  All other ROS are negative.  BP 130/86 (BP Location: Left Arm, Patient Position: Sitting, Cuff Size: Normal)   Pulse 86   Temp 98 F (36.7 C) (Oral)   Resp 16   Ht 5\' 6"  (1.676 m)   Wt 157 lb 8 oz (71.4 kg)  LMP 09/19/2015   SpO2 100%   BMI 25.42 kg/m   Physical Exam  Constitutional: She is oriented to person, place, and time and well-developed, well-nourished, and in no distress.  HENT:  Head: Normocephalic and atraumatic.  Right Ear: Tympanic membrane normal.  Left Ear: Tympanic membrane normal.  Nose: Nose normal. Right sinus exhibits no maxillary sinus tenderness and no frontal sinus tenderness. Left sinus exhibits no maxillary sinus tenderness and no frontal sinus tenderness.  Mouth/Throat: Uvula is midline and mucous membranes are normal. No uvula swelling. Posterior oropharyngeal erythema present. No oropharyngeal exudate, posterior oropharyngeal edema or tonsillar abscesses.  Eyes: Conjunctivae are normal.  Neck: Neck supple.  Cardiovascular: Normal rate, regular rhythm, normal heart sounds and intact  distal pulses.   Pulmonary/Chest: Effort normal and breath sounds normal. No respiratory distress. She has no wheezes. She has no rales. She exhibits no tenderness.  Neurological: She is alert and oriented to person, place, and time.  Skin: Skin is warm and dry. No rash noted.  Psychiatric: Affect normal.  Vitals reviewed.   No results found for this or any previous visit (from the past 2160 hour(s)).  Assessment/Plan: 1. Sore throat Persistent. Urgent Care workup with negative rapid strep and throat culture. Repeat rapid strep negative. Suspect viral pharyngitis versus EBV versus GERD-related. Continue PPI. She is to cease smoking. Supportive measures reviewed. Will alter regimen based on results. - Epstein-Barr virus nuclear antigen antibody, IgG   Piedad ClimesMartin, Askari Kinley Cody, PA-C

## 2015-09-19 NOTE — Patient Instructions (Signed)
Please alternate tylenol and ibuprofen for throat pain. Salt-water gargles and the viscous lidocaine will also help. Place a humidifier in the bedroom. I will call you with your lab results. If all looks good on your labs, we may need to send you to ENT.

## 2015-09-20 ENCOUNTER — Other Ambulatory Visit: Payer: Self-pay | Admitting: Physician Assistant

## 2015-09-20 ENCOUNTER — Telehealth: Payer: Self-pay | Admitting: Family Medicine

## 2015-09-20 DIAGNOSIS — B2780 Other infectious mononucleosis without complication: Secondary | ICD-10-CM

## 2015-09-20 LAB — EPSTEIN-BARR VIRUS NUCLEAR ANTIGEN ANTIBODY, IGG: EBV NA IGG: 142 U/mL — AB

## 2015-09-20 NOTE — Telephone Encounter (Signed)
Pt called in for lab results. Please advise.  [phone]

## 2015-09-20 NOTE — Telephone Encounter (Signed)
Patient informed of results.  

## 2015-09-21 ENCOUNTER — Other Ambulatory Visit (INDEPENDENT_AMBULATORY_CARE_PROVIDER_SITE_OTHER): Payer: Commercial Managed Care - HMO

## 2015-09-21 DIAGNOSIS — B2780 Other infectious mononucleosis without complication: Secondary | ICD-10-CM

## 2015-09-21 DIAGNOSIS — J029 Acute pharyngitis, unspecified: Secondary | ICD-10-CM

## 2015-09-21 LAB — CBC WITH DIFFERENTIAL/PLATELET
BASOS ABS: 0 10*3/uL (ref 0.0–0.1)
Basophils Relative: 0.5 % (ref 0.0–3.0)
Eosinophils Absolute: 0.2 10*3/uL (ref 0.0–0.7)
Eosinophils Relative: 2.4 % (ref 0.0–5.0)
HCT: 35.9 % — ABNORMAL LOW (ref 36.0–46.0)
HEMOGLOBIN: 12 g/dL (ref 12.0–15.0)
LYMPHS ABS: 1.9 10*3/uL (ref 0.7–4.0)
LYMPHS PCT: 25.5 % (ref 12.0–46.0)
MCHC: 33.4 g/dL (ref 30.0–36.0)
MCV: 88.3 fl (ref 78.0–100.0)
MONOS PCT: 5.7 % (ref 3.0–12.0)
Monocytes Absolute: 0.4 10*3/uL (ref 0.1–1.0)
NEUTROS PCT: 65.9 % (ref 43.0–77.0)
Neutro Abs: 4.9 10*3/uL (ref 1.4–7.7)
Platelets: 238 10*3/uL (ref 150.0–400.0)
RBC: 4.06 Mil/uL (ref 3.87–5.11)
RDW: 14.2 % (ref 11.5–15.5)
WBC: 7.4 10*3/uL (ref 4.0–10.5)

## 2015-09-23 LAB — CULTURE, GROUP A STREP: Organism ID, Bacteria: NORMAL

## 2015-09-26 ENCOUNTER — Telehealth: Payer: Self-pay | Admitting: Family Medicine

## 2015-09-26 NOTE — Telephone Encounter (Signed)
Unable to reach patient for follow-up call. Left message for patient to return call when available.

## 2015-09-26 NOTE — Telephone Encounter (Signed)
Antibiotics would be indicated if she had bacterial infection only. Prior to seeing her she had already had a negative rapid strep test and throat culture negative for strep. Her rapid test on seeing me was also negative. There was not sign of bacterial infection on exam at the time. She did have a small ulceration noted in her throat secondary to viral suspected illness that was confirmed with mono-testing. She would have to be seen again for reassessment if there is concern she has developed strep on top of the mono-like illness.

## 2015-09-26 NOTE — Telephone Encounter (Signed)
°  Relation to WU:JWJXpt:self Call back number:[phone] Pharmacy: [facility]  Reason for call: pt saw Cody on 09/19/15 and states she was dx with mono, pt states the white spots on her throat has gotten worse since she was last seen, states Selena BattenCody told her that she had to let it run its course, but she was told by a friend that she could take an antibiotic for it but she was not given an rx and now its worse, pt would like to know what alternatives she has and if she need to be seen again.

## 2015-09-27 ENCOUNTER — Ambulatory Visit: Payer: Commercial Managed Care - HMO | Admitting: Physician Assistant

## 2015-09-28 NOTE — Telephone Encounter (Signed)
Informed patient of the provider's recommendation below. She verbalized understanding and requested that her appointment to follow-up be cancelled for tomorrow. Patient reports that the white spots have all cleared up and she feels much better. Also, she addressed that if her symptoms change and she begins to feel worse again, then she will schedule an appointment with her PCP. Patient did not have any further questions or concerns prior to call ending.

## 2015-09-29 ENCOUNTER — Ambulatory Visit: Payer: Commercial Managed Care - HMO | Admitting: Physician Assistant

## 2015-10-11 ENCOUNTER — Other Ambulatory Visit: Payer: Self-pay | Admitting: Family Medicine

## 2015-10-11 NOTE — Telephone Encounter (Signed)
Last seen 08/14/15 and filled 04/06/15 #30   Please advise    KP

## 2015-11-15 ENCOUNTER — Other Ambulatory Visit: Payer: Self-pay | Admitting: Family Medicine

## 2015-11-15 NOTE — Telephone Encounter (Signed)
Last ov 09/19/15. Last fill 04/06/15 #30 0. Please advise.

## 2015-11-21 ENCOUNTER — Other Ambulatory Visit: Payer: Self-pay | Admitting: Family Medicine

## 2015-11-21 ENCOUNTER — Telehealth: Payer: Self-pay | Admitting: Family Medicine

## 2015-11-21 DIAGNOSIS — G4700 Insomnia, unspecified: Secondary | ICD-10-CM

## 2015-11-21 NOTE — Telephone Encounter (Signed)
Patient is requesting a refill of her zolpidem (AMBIEN) 10 MG tablet She is wondering if she could get a few refills on that prescriptions so that she does not have to call the office ever month. Please advise   Pharmacy:  [facility] [address] - [place], [place] - [address]

## 2015-11-22 ENCOUNTER — Telehealth: Payer: Self-pay

## 2015-11-22 ENCOUNTER — Other Ambulatory Visit: Payer: Self-pay | Admitting: Family Medicine

## 2015-11-22 ENCOUNTER — Other Ambulatory Visit: Payer: Self-pay

## 2015-11-22 NOTE — Telephone Encounter (Signed)
Last ov 08/14/15. Last fill 08/14/15 #30 2. Please advise.

## 2015-11-22 NOTE — Telephone Encounter (Signed)
Pt called in to follow up on Rx refill request. Pt says that she is now currently out and would like to know if it could be filled as soon as possible. Pt says that she didn't sleep well lastnight. 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 Advised pt that PCP is out of the office today but I will see if I can have further assistance from a different provider. Forwarded request to CMA Karoline Caldwell(Angie) she will look into this for pt.

## 2015-11-22 NOTE — Telephone Encounter (Signed)
Called in Rx for patient. LB

## 2015-11-22 NOTE — Telephone Encounter (Signed)
This is the first I've seen the refill request---  Ok to refill x1,  With 2 refills

## 2015-11-22 NOTE — Progress Notes (Signed)
Called in Rx for patient. LB 

## 2015-11-22 NOTE — Telephone Encounter (Signed)
  Last ov 08/14/15. Last fill 08/14/15 #30 2. Please advise.

## 2015-11-24 NOTE — Telephone Encounter (Signed)
Last seen 09/19/15 Last filled #30-2 rf 08/14/15 Please advise PC

## 2015-11-24 NOTE — Telephone Encounter (Signed)
Rx reprinted due tp Dr. Lowne broken printer PC 

## 2015-11-24 NOTE — Telephone Encounter (Signed)
rx faxed   PC 

## 2015-11-28 ENCOUNTER — Other Ambulatory Visit: Payer: Self-pay

## 2015-12-12 ENCOUNTER — Ambulatory Visit (INDEPENDENT_AMBULATORY_CARE_PROVIDER_SITE_OTHER): Payer: Commercial Managed Care - HMO | Admitting: Family Medicine

## 2015-12-12 ENCOUNTER — Encounter: Payer: Self-pay | Admitting: Family Medicine

## 2015-12-12 VITALS — BP 118/82 | HR 89 | Temp 98.1°F | Resp 16 | Ht 66.0 in | Wt 159.2 lb

## 2015-12-12 DIAGNOSIS — J0140 Acute pansinusitis, unspecified: Secondary | ICD-10-CM | POA: Diagnosis not present

## 2015-12-12 DIAGNOSIS — N760 Acute vaginitis: Secondary | ICD-10-CM | POA: Diagnosis not present

## 2015-12-12 DIAGNOSIS — J020 Streptococcal pharyngitis: Secondary | ICD-10-CM | POA: Diagnosis not present

## 2015-12-12 DIAGNOSIS — J329 Chronic sinusitis, unspecified: Secondary | ICD-10-CM | POA: Insufficient documentation

## 2015-12-12 LAB — POCT RAPID STREP A (OFFICE): Rapid Strep A Screen: POSITIVE — AB

## 2015-12-12 MED ORDER — METHYLPREDNISOLONE ACETATE 80 MG/ML IJ SUSP
80.0000 mg | Freq: Once | INTRAMUSCULAR | Status: AC
  Administered 2015-12-12: 80 mg via INTRAMUSCULAR

## 2015-12-12 MED ORDER — FLUCONAZOLE 150 MG PO TABS: ORAL_TABLET | ORAL | 0 refills | Status: DC

## 2015-12-12 MED ORDER — AMOXICILLIN-POT CLAVULANATE 875-125 MG PO TABS: 1.0000 | ORAL_TABLET | Freq: Two times a day (BID) | ORAL | 0 refills | Status: DC

## 2015-12-12 NOTE — Progress Notes (Signed)
Pre visit review using our clinic review tool, if applicable. No additional management support is needed unless otherwise documented below in the visit note. 

## 2015-12-12 NOTE — Patient Instructions (Signed)
Strep Throat Strep throat is a bacterial infection of the throat. Your health care provider may call the infection tonsillitis or pharyngitis, depending on whether there is swelling in the tonsils or at the back of the throat. Strep throat is most common during the cold months of the year in children who are 5-34 years of age, but it can happen during any season in people of any age. This infection is spread from person to person (contagious) through coughing, sneezing, or close contact. What are the causes? Strep throat is caused by the bacteria called Streptococcus pyogenes. What increases the risk? This condition is more likely to develop in:  People who spend time in crowded places where the infection can spread easily.  People who have close contact with someone who has strep throat.  What are the signs or symptoms? Symptoms of this condition include:  Fever or chills.  Redness, swelling, or pain in the tonsils or throat.  Pain or difficulty when swallowing.  White or yellow spots on the tonsils or throat.  Swollen, tender glands in the neck or under the jaw.  Red rash all over the body (rare).  How is this diagnosed? This condition is diagnosed by performing a rapid strep test or by taking a swab of your throat (throat culture test). Results from a rapid strep test are usually ready in a few minutes, but throat culture test results are available after one or two days. How is this treated? This condition is treated with antibiotic medicine. Follow these instructions at home: Medicines  Take over-the-counter and prescription medicines only as told by your health care provider.  Take your antibiotic as told by your health care provider. Do not stop taking the antibiotic even if you start to feel better.  Have family members who also have a sore throat or fever tested for strep throat. They may need antibiotics if they have the strep infection. Eating and drinking  Do not  share food, drinking cups, or personal items that could cause the infection to spread to other people.  If swallowing is difficult, try eating soft foods until your sore throat feels better.  Drink enough fluid to keep your urine clear or pale yellow. General instructions  Gargle with a salt-water mixture 3-4 times per day or as needed. To make a salt-water mixture, completely dissolve -1 tsp of salt in 1 cup of warm water.  Make sure that all household members wash their hands well.  Get plenty of rest.  Stay home from school or work until you have been taking antibiotics for 24 hours.  Keep all follow-up visits as told by your health care provider. This is important. Contact a health care provider if:  The glands in your neck continue to get bigger.  You develop a rash, cough, or earache.  You cough up a thick liquid that is green, yellow-brown, or bloody.  You have pain or discomfort that does not get better with medicine.  Your problems seem to be getting worse rather than better.  You have a fever. Get help right away if:  You have new symptoms, such as vomiting, severe headache, stiff or painful neck, chest pain, or shortness of breath.  You have severe throat pain, drooling, or changes in your voice.  You have swelling of the neck, or the skin on the neck becomes red and tender.  You have signs of dehydration, such as fatigue, dry mouth, and decreased urination.  You become increasingly sleepy, or   you cannot wake up completely.  Your joints become red or painful. This information is not intended to replace advice given to you by your health care provider. Make sure you discuss any questions you have with your health care provider. Document Released: 12/29/1999 Document Revised: 08/30/2015 Document Reviewed: 04/25/2014 Elsevier Interactive Patient Education  2017 Elsevier Inc.  

## 2015-12-12 NOTE — Progress Notes (Signed)
Patient ID: Angela PoundStephanie F Grilliot, female    DOB: 09/20/1980  Age: 35 y.o. MRN: [id number]    Subjective:  Subjective  HPI Angela PoundStephanie F Weekes presents for sore throat and sinus congestion.   Sore throat for few weeks and sinus symptoms started a few days ago.  No fever,  + sinus pressure + cough  Review of Systems  Constitutional: Negative for chills and fever.  HENT: Positive for congestion, postnasal drip, rhinorrhea and sinus pressure.   Respiratory: Positive for cough. Negative for chest tightness, shortness of breath and wheezing.   Cardiovascular: Negative for chest pain, palpitations and leg swelling.  Allergic/Immunologic: Negative for environmental allergies.    History Past Medical History:  Diagnosis Date  . Anxiety   . Asthma   . GERD (gastroesophageal reflux disease)   . Insomnia     She has a past surgical history that includes Anal Rectal manometry (N/A, 01/04/2015).   Her family history includes Asthma in her mother; Hypertension in her father.She reports that she has been smoking Cigarettes.  She has never used smokeless tobacco. She reports that she drinks alcohol. She reports that she does not use drugs.  Current Outpatient Prescriptions on File Prior to Visit  Medication Sig Dispense Refill  . ADVAIR DISKUS 250-50 MCG/DOSE AEPB Inhale 1 puff into the lungs at bedtime.     Marland Kitchen. albuterol (PROVENTIL HFA;VENTOLIN HFA) 108 (90 BASE) MCG/ACT inhaler Inhale 2 puffs into the lungs every 6 (six) hours as needed. Reported on 02/01/2015    . aspirin-acetaminophen-caffeine (EXCEDRIN EXTRA STRENGTH) 250-250-65 MG tablet Take 1 tablet by mouth every 6 (six) hours as needed for headache. 30 tablet 0  . cyclobenzaprine (FLEXERIL) 10 MG tablet TAKE ONE TABLET BY MOUTH THREE TIMES DAILY AS NEEDED FOR MUSCLE SPASM 30 tablet 0  . fluticasone (FLONASE) 50 MCG/ACT nasal spray Place 2 sprays into both nostrils daily. 16 g 6  . ibuprofen (ADVIL,MOTRIN) 800 MG tablet Take 1 tablet by mouth as  needed.    Marland Kitchen. levocetirizine (XYZAL) 5 MG tablet Take 1 tablet by mouth at bedtime.    . Linaclotide (LINZESS) 290 MCG CAPS capsule Take 1 capsule (290 mcg total) by mouth daily. (Patient taking differently: Take 290 mcg by mouth. ) 30 capsule 3  . montelukast (SINGULAIR) 10 MG tablet Take 1 tablet by mouth at bedtime.    Marland Kitchen. omeprazole (PRILOSEC) 40 MG capsule Take 1 capsule (40 mg total) by mouth daily. 90 capsule 3  . zolpidem (AMBIEN) 10 MG tablet TAKE ONE TABLET BY MOUTH AT BEDTIME 30 tablet 2   No current facility-administered medications on file prior to visit.      Objective:  Objective  Physical Exam  Constitutional: She is oriented to person, place, and time. She appears well-developed and well-nourished.  HENT:  Right Ear: Hearing, tympanic membrane, external ear and ear canal normal.  Left Ear: Tympanic membrane, external ear and ear canal normal. Decreased hearing is noted.  Nose: Right sinus exhibits maxillary sinus tenderness and frontal sinus tenderness. Left sinus exhibits maxillary sinus tenderness and frontal sinus tenderness.  + PND + errythema  Eyes: Conjunctivae are normal. Right eye exhibits no discharge. Left eye exhibits no discharge.  Cardiovascular: Normal rate, regular rhythm and normal heart sounds.   No murmur heard. Pulmonary/Chest: Effort normal and breath sounds normal. No respiratory distress. She has no wheezes. She has no rales. She exhibits no tenderness.  Musculoskeletal: She exhibits no edema.  Lymphadenopathy:    She has cervical adenopathy.  Neurological: She is alert and oriented to person, place, and time.  Nursing note and vitals reviewed.  BP 118/82 (BP Location: Right Arm, Patient Position: Sitting, Cuff Size: Normal)   Pulse 89   Temp 98.1 F (36.7 C) (Oral)   Resp 16   Ht 5\' 6"  (1.676 m)   Wt 159 lb 3.2 oz (72.2 kg)   LMP 12/12/2015 Comment: pt report she is currently having a period.  SpO2 98%   BMI 25.70 kg/m  Wt Readings from  Last 3 Encounters:  12/12/15 159 lb 3.2 oz (72.2 kg)  09/19/15 157 lb 8 oz (71.4 kg)  08/14/15 156 lb 9.6 oz (71 kg)     Lab Results  Component Value Date   WBC 7.4 09/21/2015   HGB 12.0 09/21/2015   HCT 35.9 (L) 09/21/2015   PLT 238.0 09/21/2015   GLUCOSE 89 12/21/2013   ALT 14 12/21/2013   AST 17 12/21/2013   NA 137 12/21/2013   K 4.4 12/21/2013   CL 105 12/21/2013   CREATININE 0.7 12/21/2013   BUN 9 12/21/2013   CO2 25 12/21/2013   TSH 0.85 12/23/2012    Dg Abd 1 View  Result Date: 01/03/2015 CLINICAL DATA:  Six months of chronic constipation, history of irritable bowel disease, Sitz marker study. EXAM: ABDOMEN - 1 VIEW COMPARISON:  No recent examinations in PACs FINDINGS: There are 8 Sitz markers centered over the mid and left aspect of the pelvis. Radiodense tablets project over the left sacral ale a in the left iliac crest. The colonic stool burden is increased. There is no small or large bowel obstructive pattern. There are no abnormal soft tissue calcifications. The bony structures are unremarkable. IMPRESSION: There are 8 Sitz markers visible centered over the distal descending colon as well as in the rectosigmoid. There are also radiodense tablets present. Increased colonic stool burden diffusely is consistent with clinical constipation. Electronically Signed   By: David  SwazilandJordan M.D.   On: 01/03/2015 14:01     Assessment & Plan:  Plan  I am having Ms. Musich start on amoxicillin-clavulanate and fluconazole. I am also having her maintain her albuterol, ADVAIR DISKUS, montelukast, levocetirizine, linaclotide, ibuprofen, aspirin-acetaminophen-caffeine, cyclobenzaprine, omeprazole, fluticasone, and zolpidem. We administered methylPREDNISolone acetate.  Meds ordered this encounter  Medications  . amoxicillin-clavulanate (AUGMENTIN) 875-125 MG tablet    Sig: Take 1 tablet by mouth 2 (two) times daily.    Dispense:  20 tablet    Refill:  0  . fluconazole (DIFLUCAN) 150 MG  tablet    Sig: 1po x1, may repeat in 3 days prn    Dispense:  2 tablet    Refill:  0  . methylPREDNISolone acetate (DEPO-MEDROL) injection 80 mg    Problem List Items Addressed This Visit      Unprioritized   Sinusitis    abx per orders  Depo medrol 80 IM x1 rto prn  Drink plenty of fluids       Relevant Medications   amoxicillin-clavulanate (AUGMENTIN) 875-125 MG tablet   fluconazole (DIFLUCAN) 150 MG tablet   methylPREDNISolone acetate (DEPO-MEDROL) injection 80 mg (Completed)   Streptococcal sore throat - Primary   Relevant Medications   amoxicillin-clavulanate (AUGMENTIN) 875-125 MG tablet   fluconazole (DIFLUCAN) 150 MG tablet   methylPREDNISolone acetate (DEPO-MEDROL) injection 80 mg (Completed)   Other Relevant Orders   POCT rapid strep A (Completed)    Other Visit Diagnoses    Vaginitis and vulvovaginitis       Relevant Medications  fluconazole (DIFLUCAN) 150 MG tablet      Follow-up: No Follow-up on file.  Donato Schultz, DO

## 2015-12-12 NOTE — Assessment & Plan Note (Signed)
abx per orders  Depo medrol 80 IM x1 rto prn  Drink plenty of fluids

## 2015-12-17 ENCOUNTER — Other Ambulatory Visit: Payer: Self-pay | Admitting: Family Medicine

## 2015-12-18 NOTE — Telephone Encounter (Signed)
Last ov 12/12/15. Last fill 04/06/15 #30 0. Please advise.

## 2015-12-18 NOTE — Telephone Encounter (Signed)
Patient called stating that she is out of this medication and will need it today if possible.

## 2016-01-17 ENCOUNTER — Other Ambulatory Visit: Payer: Self-pay | Admitting: Family Medicine

## 2016-01-18 NOTE — Telephone Encounter (Signed)
Pt called in to follow up on refill request

## 2016-01-19 MED ORDER — CYCLOBENZAPRINE HCL 10 MG PO TABS: 10.0000 mg | ORAL_TABLET | Freq: Three times a day (TID) | ORAL | 0 refills | Status: DC | PRN

## 2016-01-19 NOTE — Addendum Note (Signed)
Addended by: Scharlene GlossEWING, ROBIN B on: 01/19/2016 02:23 PM   Modules accepted: Orders

## 2016-01-19 NOTE — Telephone Encounter (Signed)
Ok to fill last refill for flexeril 12/18/2015

## 2016-01-19 NOTE — Telephone Encounter (Signed)
Refill done as instructed 

## 2016-01-24 ENCOUNTER — Encounter: Payer: Self-pay | Admitting: Family Medicine

## 2016-01-24 ENCOUNTER — Ambulatory Visit (INDEPENDENT_AMBULATORY_CARE_PROVIDER_SITE_OTHER): Payer: Commercial Managed Care - HMO | Admitting: Family Medicine

## 2016-01-24 VITALS — BP 120/86 | HR 87 | Temp 98.3°F | Ht 66.0 in | Wt 159.8 lb

## 2016-01-24 DIAGNOSIS — J111 Influenza due to unidentified influenza virus with other respiratory manifestations: Secondary | ICD-10-CM | POA: Diagnosis not present

## 2016-01-24 MED ORDER — OSELTAMIVIR PHOSPHATE 75 MG PO CAPS: 75.0000 mg | ORAL_CAPSULE | Freq: Two times a day (BID) | ORAL | 0 refills | Status: AC

## 2016-01-24 NOTE — Patient Instructions (Addendum)

## 2016-01-24 NOTE — Progress Notes (Signed)
Chief Complaint  Patient presents with  . Cough    (dry),run down,h/a-sxs started yest    Angela PoundStephanie F Bryant here for URI complaints.  Duration: 1 day  Associated symptoms: sinus congestion, rhinorrhea, myalgia and cough Denies: subjective fever, sinus pain, itchy watery eyes, ear pain, ear drainage, sore throat and shortness of breath Treatment to date: OTC remedies Sick contacts: Yes- husband and 2 friends she hung out with over the weekend were diagnosed with the flu  ROS:  Const: Denies fevers HEENT: As noted in HPI Lungs: No SOB  Past Medical History:  Diagnosis Date  . Anxiety   . Asthma   . GERD (gastroesophageal reflux disease)   . Insomnia    Family History  Problem Relation Age of Onset  . Hypertension Father   . Asthma Mother     BP 120/86 (BP Location: Left Arm, Patient Position: Sitting, Cuff Size: Normal)   Pulse 87   Temp 98.3 F (36.8 C) (Oral)   Ht 5\' 6"  (1.676 m)   Wt 159 lb 12.8 oz (72.5 kg)   LMP 01/09/2016 (Exact Date)   SpO2 99%   BMI 25.79 kg/m  General: Awake, alert, appears stated age HEENT: AT, [place], ears patent b/l and TM's neg, nares patent w/o discharge, pharynx pink and without exudates, MMM Neck: No masses or asymmetry Heart: RRR, no murmurs, no bruits Lungs: CTAB, no accessory muscle use Psych: Age appropriate judgment and insight, normal mood and affect  Influenza - Plan: oseltamivir (TAMIFLU) 75 MG capsule  Orders as above. Continue to push fluids, practice good hand hygiene, cover mouth when coughing. Ibuprofen and Tylenol for myalgias or any potential fevers. Letter for work given. F/u in 1 week if symptoms worsen or fail to improve. Pt voiced understanding and agreement to the plan.  Angela Rocheicholas Paul [place]ress[place], DO 01/24/16 3:06 PM

## 2016-01-30 DIAGNOSIS — Z01419 Encounter for gynecological examination (general) (routine) without abnormal findings: Secondary | ICD-10-CM | POA: Diagnosis not present

## 2016-02-05 ENCOUNTER — Telehealth: Payer: Self-pay | Admitting: Family Medicine

## 2016-02-05 NOTE — Telephone Encounter (Signed)
Last office visit 12/12/2015 Trazodone is not on her current list, but zolpidem is ??  Advise on this request

## 2016-02-05 NOTE — Telephone Encounter (Signed)
Start with trazadone 50 mg 1-2 po qhs prn #60

## 2016-02-05 NOTE — Telephone Encounter (Signed)
Who changed it

## 2016-02-05 NOTE — Telephone Encounter (Signed)
She previously saw Dr. Milagros Evenerupinder Kaur who prescribed trazodone.  She did stop it and is now trying to get off zolpidem and she said had discussed with PCP to try the trazodone again, help to get her off zolpidem. She was on trazodone 150 mg.

## 2016-02-05 NOTE — Telephone Encounter (Signed)
Caller name: Relationship to patient: Can be reached: [phone] Pharmacy:  Reason for call:  Patient request refill on Trazadone for sleep. States she has not taken it since 2016 and Dr Laury AxonLowne did not prescribe it.

## 2016-02-06 ENCOUNTER — Other Ambulatory Visit: Payer: Self-pay | Admitting: Family Medicine

## 2016-02-06 DIAGNOSIS — G4700 Insomnia, unspecified: Secondary | ICD-10-CM

## 2016-02-06 MED ORDER — TRAZODONE HCL 50 MG PO TABS: ORAL_TABLET | ORAL | 0 refills | Status: DC

## 2016-02-06 NOTE — Telephone Encounter (Signed)
Enter error. LB 

## 2016-02-06 NOTE — Telephone Encounter (Signed)
Pt says that she will be out of town she is leaving this Friday so she is requesting a refill on medication sooner than normal. Pt says that she has already spoken to insurance to be sure that they will cover.

## 2016-02-06 NOTE — Telephone Encounter (Signed)
[facility] [place] Patient informed faxed in.

## 2016-02-06 NOTE — Telephone Encounter (Signed)
Last refill for Zolpidem  #30 with 2 refills on 11/24/2015 Last office visit  12/12/2015 No UDS/No Contract Next schedule appt.--NONE

## 2016-02-06 NOTE — Telephone Encounter (Signed)
Trazodone sent [facility] to [facility] [address]. Called the patient informed trazodone sent in for her as she requested. (left detailed message)

## 2016-02-21 ENCOUNTER — Other Ambulatory Visit: Payer: Self-pay

## 2016-02-21 ENCOUNTER — Telehealth: Payer: Self-pay | Admitting: Gastroenterology

## 2016-02-21 MED ORDER — LINACLOTIDE 145 MCG PO CAPS: ORAL_CAPSULE | ORAL | 3 refills | Status: DC

## 2016-02-21 NOTE — Telephone Encounter (Signed)
Ok to try lower dose Linzess daily, please advise her to take it on empty stomach daily in the morning. Thanks

## 2016-02-21 NOTE — Telephone Encounter (Signed)
Usually takes her Linzess daily. It works. Doesn't always cause loose stool, but she does feel crampy and sick with it. Has a bowel movement at least weekly. She tried Amitiza a year ago. Doesn't remember it as being effective. What do you advise?

## 2016-02-21 NOTE — Telephone Encounter (Signed)
Patient instructed. Rx to her pharmacy with specific instructions

## 2016-02-23 ENCOUNTER — Telehealth: Payer: Self-pay | Admitting: Gastroenterology

## 2016-02-23 NOTE — Telephone Encounter (Signed)
[facility]- Will have to contact [facility] to send rejection letter

## 2016-02-23 NOTE — Telephone Encounter (Signed)
Spoke with patient and explained to her that iv [facility] twice with no response , I told her to contact them and have them lax over a denial form so we can contact her insurance

## 2016-02-29 ENCOUNTER — Telehealth: Payer: Self-pay | Admitting: Gastroenterology

## 2016-02-29 NOTE — Telephone Encounter (Signed)
Submitted authorization to [facility] through Cover My Meds  Waiting on response

## 2016-02-29 NOTE — Telephone Encounter (Signed)
[facility] has not sent over a fax of rejection from insurance co yet. Just spoke to pharmacy and they have been sending the rejection to the  wrong  Fax number.. Will wait for fax and do prior auth then

## 2016-03-27 ENCOUNTER — Other Ambulatory Visit: Payer: Self-pay | Admitting: Family Medicine

## 2016-03-27 NOTE — Telephone Encounter (Signed)
Last office visit 12/12/2015 Last refill #30 on 04/06/2015

## 2016-03-29 ENCOUNTER — Other Ambulatory Visit: Payer: Self-pay | Admitting: Family Medicine

## 2016-03-29 MED ORDER — CYCLOBENZAPRINE HCL 10 MG PO TABS: 10.0000 mg | ORAL_TABLET | Freq: Three times a day (TID) | ORAL | 0 refills | Status: DC | PRN

## 2016-03-29 NOTE — Telephone Encounter (Signed)
Resent to pharmacy. Called her and she just was told by the pharmacy they have had it for 3 days and did not contact her. She now has the refill

## 2016-03-29 NOTE — Telephone Encounter (Signed)
Caller name: Judeth CornfieldStephanie Relation to pt: self Call back number: [phone] [facility] [address] [facility] [place], [place] - [address]  Reason for call: Pt states called pharmacy yesterday afternoon 03-28-2016 to verify if she had her rx ready for cyclobenzaprine (FLEXERIL) 10 MG tablet and the pharmacist stated that they have not received the rx for the meds. Pt states is out of her med and is needing the rx sent ASAP. (Pt was informed that perscription was sent to pharmacy on 03-27-2016) Pt was going verify with pharmacy again but does want our office to call pharmacy about the rx. Please advise.

## 2016-04-03 DIAGNOSIS — N920 Excessive and frequent menstruation with regular cycle: Secondary | ICD-10-CM | POA: Diagnosis not present

## 2016-04-09 DIAGNOSIS — K130 Diseases of lips: Secondary | ICD-10-CM | POA: Diagnosis not present

## 2016-04-09 DIAGNOSIS — L309 Dermatitis, unspecified: Secondary | ICD-10-CM | POA: Diagnosis not present

## 2016-04-29 ENCOUNTER — Other Ambulatory Visit: Payer: Self-pay | Admitting: Family Medicine

## 2016-05-06 ENCOUNTER — Other Ambulatory Visit: Payer: Self-pay | Admitting: Family Medicine

## 2016-05-06 DIAGNOSIS — G4700 Insomnia, unspecified: Secondary | ICD-10-CM

## 2016-05-06 NOTE — Telephone Encounter (Signed)
Faxed hardcopy for Zolpidem to [facility] on [address]

## 2016-05-06 NOTE — Telephone Encounter (Signed)
Requesting:   Zolpidem Contract    none UDS  none Last OV    12/12/2015 Last Refill      #30 with 2 refills on 1 23/2018  Please Advise

## 2016-05-22 ENCOUNTER — Other Ambulatory Visit: Payer: Self-pay | Admitting: Family Medicine

## 2016-07-02 ENCOUNTER — Other Ambulatory Visit: Payer: Self-pay | Admitting: Family Medicine

## 2016-07-02 NOTE — Telephone Encounter (Signed)
Last office visit on 12/12/2015 Last refill on 03/29/2016  #30

## 2016-07-08 ENCOUNTER — Encounter: Payer: Self-pay | Admitting: Family

## 2016-07-08 ENCOUNTER — Ambulatory Visit (INDEPENDENT_AMBULATORY_CARE_PROVIDER_SITE_OTHER): Payer: 59 | Admitting: Family

## 2016-07-08 VITALS — BP 122/90 | HR 90 | Temp 98.3°F | Resp 16 | Ht 66.0 in | Wt 163.4 lb

## 2016-07-08 DIAGNOSIS — J029 Acute pharyngitis, unspecified: Secondary | ICD-10-CM

## 2016-07-08 LAB — POCT RAPID STREP A (OFFICE): RAPID STREP A SCREEN: NEGATIVE

## 2016-07-08 NOTE — Patient Instructions (Signed)
Symptoms are most consistent with a virus. Rapid strep is negative and we are sending a swab to confirm in the lab. You may use tylenol/motrin for pain and chloraseptic spray or cepacol lozenges. Call if new/worsening symptoms or if not improved in 3 days.

## 2016-07-08 NOTE — Progress Notes (Signed)
Subjective:    Patient ID: Angela Bryant, female    DOB: 23-Jul-1980, 36 y.o.   MRN: [id number]  HPI  Angela Bryant is a 36 yr old female who presents today with chief complaint of sore throat. Sore throat has been present x 2 days. Reports that she saw "2 white spots" on her throat yesterday.  Hurts to swallow. Denies associated cough, fever or nasal congestion. Works in Engineering geologist.    Review of Systems See HPI  Past Medical History:  Diagnosis Date  . Anxiety   . Asthma   . GERD (gastroesophageal reflux disease)   . Insomnia      Social History   Social History  . Marital status: Married    Spouse name: N/A  . Number of children: 0  . Years of education: N/A   Occupational History  . 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 Smoking status: Light Tobacco Smoker    Types: Cigarettes  . Smokeless tobacco: Never Used     Comment: rarely has a cigarrette, sometimes on weekends  . Alcohol use 0.0 oz/week     Comment: occasional   . Drug use: No  . Sexual activity: Yes    Partners: Male   Other Topics Concern  . Not on file   Social History Narrative  . No narrative on file    Past Surgical History:  Procedure Laterality Date  . ANAL RECTAL MANOMETRY N/A 01/04/2015   Procedure: ANO RECTAL MANOMETRY;  Surgeon: Napoleon Form, MD;  Location: [facility];  Service: Endoscopy;  Laterality: N/A;    Family History  Problem Relation Age of Onset  . Hypertension Father   . Asthma Mother     Allergies  Allergen Reactions  . Codeine Nausea And Vomiting    Current Outpatient Prescriptions on File Prior to Visit  Medication Sig Dispense Refill  . ADVAIR DISKUS 250-50 MCG/DOSE AEPB Inhale 1 puff into the lungs at bedtime.     Marland Kitchen albuterol (PROVENTIL HFA;VENTOLIN HFA) 108 (90 BASE) MCG/ACT inhaler Inhale 2 puffs into the lungs every 6 (six) hours as needed. Reported on 02/01/2015    . aspirin-acetaminophen-caffeine (EXCEDRIN EXTRA STRENGTH) 250-250-65 MG  tablet Take 1 tablet by mouth every 6 (six) hours as needed for headache. 30 tablet 0  . cyclobenzaprine (FLEXERIL) 10 MG tablet Take 1 tablet (10 mg total) by mouth 3 (three) times daily as needed for muscle spasms. 30 tablet 0  . fluticasone (FLONASE) 50 MCG/ACT nasal spray Place 2 sprays into both nostrils daily. 16 g 6  . ibuprofen (ADVIL,MOTRIN) 800 MG tablet Take 1 tablet by mouth as needed.    Marland Kitchen levocetirizine (XYZAL) 5 MG tablet Take 1 tablet by mouth at bedtime.    Marland Kitchen linaclotide (LINZESS) 145 MCG CAPS capsule take one capsule on empty stomach daily in the morning 30 capsule 3  . montelukast (SINGULAIR) 10 MG tablet Take 1 tablet by mouth at bedtime.    Marland Kitchen omeprazole (PRILOSEC) 40 MG capsule Take 1 capsule (40 mg total) by mouth daily. 90 capsule 3  . zolpidem (AMBIEN) 10 MG tablet TAKE ONE TABLET BY MOUTH AT BEDTIME 30 tablet 2   No current facility-administered medications on file prior to visit.     BP 122/90 (BP Location: Right Arm, Cuff Size: Normal)   Pulse 90   Temp 98.3 F (36.8 C) (Oral)   Resp 16   Ht 5\' 6"  (1.676 m)   Wt 163 lb  6.4 oz (74.1 kg)   LMP 07/08/2016   SpO2 100%   BMI 26.37 kg/m       Objective:   Physical Exam  Constitutional: She is oriented to person, place, and time. She appears well-developed and well-nourished.  HENT:  Head: Normocephalic and atraumatic.  Right Ear: Tympanic membrane and ear canal normal.  Left Ear: Tympanic membrane and ear canal normal.  Minimal oropharyngeal erythema noted. Small blister right side of uvula, no exudate  Cardiovascular: Normal rate, regular rhythm and normal heart sounds.   No murmur heard. Pulmonary/Chest: Effort normal and breath sounds normal. No respiratory distress. She has no wheezes.  Lymphadenopathy:    She has no cervical adenopathy.  Neurological: She is alert and oriented to person, place, and time.  Psychiatric: She has a normal mood and affect. Her behavior is normal. Judgment and thought  content normal.          Assessment & Plan:  Pharyngitis- rapid strep negative. Will check Strep Probe to confirm.  Advised pt symptoms most likely viral. Advised supportive measures as outlined in AVS. Advised her that I clinically doubt that her symptoms are due to recurrent mono. 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  She is advised to call if new/worsening symptoms or if not improved in 3 days.

## 2016-07-10 LAB — CULTURE, GROUP A STREP

## 2016-07-22 ENCOUNTER — Telehealth: Payer: Self-pay | Admitting: Family Medicine

## 2016-07-22 MED ORDER — IBUPROFEN 800 MG PO TABS: 800.0000 mg | ORAL_TABLET | ORAL | 0 refills | Status: DC | PRN

## 2016-07-22 MED ORDER — IBUPROFEN 800 MG PO TABS: 800.0000 mg | ORAL_TABLET | Freq: Three times a day (TID) | ORAL | 0 refills | Status: DC | PRN

## 2016-07-22 NOTE — Telephone Encounter (Signed)
Add sig 1 po q8h prn

## 2016-07-22 NOTE — Telephone Encounter (Signed)
Sent in prescription with sig

## 2016-07-22 NOTE — Telephone Encounter (Signed)
[id number] [address] [place], [place] [id number]   Reason for call:  Pharmacy requesting a refill ibuprofen (ADVIL,MOTRIN) 800 MG tablet

## 2016-07-22 NOTE — Telephone Encounter (Signed)
Refill done.  

## 2016-07-22 NOTE — Telephone Encounter (Signed)
[facility] needing to verify the frequency of theibuprofen (ADVIL,MOTRIN) 800 MG tablet

## 2016-07-22 NOTE — Telephone Encounter (Signed)
Pharmacy needs clarification of sig on ibuprofen

## 2016-07-30 ENCOUNTER — Other Ambulatory Visit: Payer: Self-pay | Admitting: Family Medicine

## 2016-07-30 DIAGNOSIS — G4700 Insomnia, unspecified: Secondary | ICD-10-CM

## 2016-07-30 NOTE — Telephone Encounter (Signed)
Requesting:   zolpidem Contract     none UDS   none Last OV    12/12/2015 Last Refill   #30 with 2 refills on 05/06/2016  Please Advise

## 2016-07-30 NOTE — Telephone Encounter (Signed)
[facility] [address] [place]

## 2016-08-02 ENCOUNTER — Telehealth: Payer: Self-pay | Admitting: Family Medicine

## 2016-08-02 ENCOUNTER — Other Ambulatory Visit: Payer: Self-pay | Admitting: Family Medicine

## 2016-08-02 DIAGNOSIS — G4700 Insomnia, unspecified: Secondary | ICD-10-CM

## 2016-08-02 NOTE — Telephone Encounter (Signed)
rx [facility] [address]

## 2016-08-02 NOTE — Telephone Encounter (Signed)
sheketa took care of changing pharmacy

## 2016-08-02 NOTE — Telephone Encounter (Signed)
Error/gd °

## 2016-08-02 NOTE — Telephone Encounter (Signed)
Caller name: Tammy SoursGreg Relation to pt: Call back number: [phone] [facility] [address]  Reason for call: pharmacy states they did not receive the request for rx zolpidem (AMBIEN) please resend thanks pt needs to pick up today

## 2016-08-02 NOTE — Telephone Encounter (Signed)
rx called into pharmacy

## 2016-08-02 NOTE — Telephone Encounter (Signed)
[facility] [address]

## 2016-08-02 NOTE — Telephone Encounter (Signed)
Patient requesting Rx please send to   [id number] [place], [place] - [address] AT [facility] [address] [phone] (Fax)

## 2016-08-08 ENCOUNTER — Other Ambulatory Visit: Payer: Self-pay | Admitting: Family Medicine

## 2016-08-08 NOTE — Telephone Encounter (Signed)
Last refill  #30 on 03/29/16 Last office visit on 12/12/2015

## 2016-08-30 ENCOUNTER — Other Ambulatory Visit: Payer: Self-pay | Admitting: Family Medicine

## 2016-08-30 DIAGNOSIS — G4700 Insomnia, unspecified: Secondary | ICD-10-CM

## 2016-08-30 NOTE — Telephone Encounter (Signed)
Faxed hardcopy for zolpidem to [facility]

## 2016-08-30 NOTE — Telephone Encounter (Signed)
Pt called in to check on her refill request. Pt says that pharmacy stated that they havent received refill on Zolpidem    Pharmacy: [id number] - [place], [place] - [address] AT [facility] [address]

## 2016-08-30 NOTE — Telephone Encounter (Signed)
Fax failed/telephone the refill into [facility].

## 2016-08-30 NOTE — Telephone Encounter (Signed)
Requesting:    zolpidem Contract   None UDS   NOne Last OV    12/12/2015---no scheduled future appt. Last Refill     #30 on 07/30/2016  Please Advise

## 2016-09-05 ENCOUNTER — Telehealth: Payer: Self-pay | Admitting: Family Medicine

## 2016-09-05 ENCOUNTER — Other Ambulatory Visit: Payer: Self-pay | Admitting: Gastroenterology

## 2016-09-05 NOTE — Telephone Encounter (Signed)
Patient is requesting refill for ibuprofen (ADVIL,MOTRIN) 800 MG tablet be [facility]

## 2016-09-06 MED ORDER — IBUPROFEN 800 MG PO TABS: 800.0000 mg | ORAL_TABLET | Freq: Three times a day (TID) | ORAL | 0 refills | Status: DC | PRN

## 2016-09-06 NOTE — Addendum Note (Signed)
Addended by: Vergie Living on: 09/06/2016 11:18 AM   Modules accepted: Orders

## 2016-09-06 NOTE — Telephone Encounter (Signed)
Sent Rx to pharmacy. LB 

## 2016-09-11 ENCOUNTER — Ambulatory Visit: Payer: Self-pay | Admitting: Medical

## 2016-09-12 ENCOUNTER — Other Ambulatory Visit: Payer: Self-pay | Admitting: Family Medicine

## 2016-09-13 ENCOUNTER — Other Ambulatory Visit: Payer: Self-pay | Admitting: Family Medicine

## 2016-09-13 ENCOUNTER — Telehealth: Payer: Self-pay

## 2016-09-13 ENCOUNTER — Telehealth: Payer: Self-pay | Admitting: Family Medicine

## 2016-09-13 MED ORDER — CYCLOBENZAPRINE HCL 10 MG PO TABS: 10.0000 mg | ORAL_TABLET | Freq: Three times a day (TID) | ORAL | 0 refills | Status: DC | PRN

## 2016-09-13 NOTE — Telephone Encounter (Signed)
Provider faxed per encounter/thx dmf

## 2016-09-13 NOTE — Telephone Encounter (Signed)
YL-I'm not sure about this refill? Looks like she is only seen acute and I don't know when she is supposed to schedule/pharm requesting refill for Cyclobenzaprine/plz advise/thx dmf

## 2016-09-13 NOTE — Telephone Encounter (Signed)
°  Relation to ZO:XWRUpt:self Call back number:[phone] [id number] - [place], [place] - [address] [facility] [address] [phone] (Phone) [phone])     Reason for call:  Patient checking on the status of cyclobenzaprine (FLEXERIL) 10 MG refill patient states she's going out of town and would like a 90 day supply sent today before 12noon, please advise

## 2016-09-13 NOTE — Telephone Encounter (Signed)
Please advise on refill request. Pt is requesting a 90 day supply. [facility] Last filled 37/26/18 QTY: 30

## 2016-09-13 NOTE — Telephone Encounter (Signed)
Sent!

## 2016-09-13 NOTE — Telephone Encounter (Signed)
Spoke with pt and she stated that she is already out of town she states she told them she needed it before 11am but the message that was placed says noon. I apologized to the patient. She states she will [facility] to [facility] to see if they can pull the rx. Informed the pt to call us if she needs anything further. Nothing further is needed.

## 2016-09-13 NOTE — Telephone Encounter (Signed)
I sent it already  

## 2016-09-13 NOTE — Telephone Encounter (Signed)
Sent note to provider requesting/thx dmf

## 2016-09-18 ENCOUNTER — Encounter: Payer: Self-pay | Admitting: Family

## 2016-09-18 ENCOUNTER — Ambulatory Visit (INDEPENDENT_AMBULATORY_CARE_PROVIDER_SITE_OTHER): Payer: 59 | Admitting: Family

## 2016-09-18 VITALS — BP 126/80 | HR 87 | Temp 98.6°F | Resp 16 | Ht 66.0 in | Wt 163.6 lb

## 2016-09-18 DIAGNOSIS — M791 Myalgia: Secondary | ICD-10-CM

## 2016-09-18 DIAGNOSIS — R21 Rash and other nonspecific skin eruption: Secondary | ICD-10-CM

## 2016-09-18 DIAGNOSIS — M7918 Myalgia, other site: Secondary | ICD-10-CM

## 2016-09-18 DIAGNOSIS — M79601 Pain in right arm: Secondary | ICD-10-CM

## 2016-09-18 MED ORDER — METHYLPREDNISOLONE 4 MG PO TBPK: ORAL_TABLET | ORAL | 0 refills | Status: DC

## 2016-09-18 MED FILL — METHYLPREDNISOLONE 4 MG TAB: 4 | 6 days supply | Qty: 21 | Fill #0

## 2016-09-18 NOTE — Progress Notes (Signed)
Subjective:    Patient ID: Angela Bryant, female    DOB: January 25, 1980, 36 y.o.   MRN: [id number]  HPI  Angela Bryant is a 36 yr old female who presents today with report of "itching on neck and face." [place] possible [place] to [place].    R arm pan- Has had pain x 6 months.  Started after Angela Bryant went bowling.  Has pain in the right forearm worst with right wrist flexion.  Hurts to pick up heavy objects.    Review of Systems See HPI  Past Medical History:  Diagnosis Date  . Anxiety   . Asthma   . GERD (gastroesophageal reflux disease)   . Insomnia      Social History   Social History  . Marital status: Married    Spouse name: N/A  . Number of children: 0  . Years of education: N/A   Occupational History  . 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 Smoking status: Light Tobacco Smoker    Types: Cigarettes  . Smokeless tobacco: Never Used     Comment: rarely has a cigarrette, sometimes on weekends  . Alcohol use 0.0 oz/week     Comment: occasional   . Drug use: No  . Sexual activity: Yes    Partners: Male   Other Topics Concern  . Not on file   Social History Narrative  . No narrative on file    Past Surgical History:  Procedure Laterality Date  . ANAL RECTAL MANOMETRY N/A 01/04/2015   Procedure: ANO RECTAL MANOMETRY;  Surgeon: Napoleon Form, MD;  Location: [facility];  Service: Endoscopy;  Laterality: N/A;    Family History  Problem Relation Age of Onset  . Hypertension Father   . Asthma Mother     Allergies  Allergen Reactions  . Codeine Nausea And Vomiting    Current Outpatient Prescriptions on File Prior to Visit  Medication Sig Dispense Refill  . ADVAIR DISKUS 250-50 MCG/DOSE AEPB Inhale 1 puff into the lungs at bedtime.     Marland Kitchen albuterol (PROVENTIL HFA;VENTOLIN HFA) 108 (90 BASE) MCG/ACT inhaler Inhale 2 puffs into the lungs every 6 (six) hours as needed. Reported on 02/01/2015    . cyclobenzaprine  (FLEXERIL) 10 MG tablet TAKE 1 TABLET BY MOUTH THREE TIMES DAILY 30 tablet 0  . fluticasone (FLONASE) 50 MCG/ACT nasal spray Place 2 sprays into both nostrils daily. 16 g 6  . ibuprofen (ADVIL,MOTRIN) 800 MG tablet Take 1 tablet (800 mg total) by mouth every 8 (eight) hours as needed. 30 tablet 0  . levocetirizine (XYZAL) 5 MG tablet Take 1 tablet by mouth at bedtime.    Marland Kitchen levonorgestrel (MIRENA) 20 MCG/24HR IUD 1 each by Intrauterine route once.    Marland Kitchen LINZESS 145 MCG CAPS capsule TAKE 1 CAPSULE BY MOUTH EVERY MORNING ON EMPTY STOMACH 30 capsule 0  . montelukast (SINGULAIR) 10 MG tablet Take 1 tablet by mouth at bedtime.    Marland Kitchen omeprazole (PRILOSEC) 40 MG capsule Take 1 capsule (40 mg total) by mouth daily. 90 capsule 3  . zolpidem (AMBIEN) 10 MG tablet TAKE 1 TABLET BY MOUTH EVERY NIGHT AT BEDTIME 30 tablet 0   No current facility-administered medications on file prior to visit.     BP 126/80 (BP Location: Left Arm, Cuff Size: Normal)   Pulse 87   Temp 98.6 F (37 C) (Oral)   Resp 16   Ht 5'  6" (1.676 m)   Wt 163 lb 9.6 oz (74.2 kg)   SpO2 99%   BMI 26.41 kg/m       Objective:   Physical Exam  Constitutional: Angela Bryant is oriented to person, place, and time. Angela Bryant appears well-developed and well-nourished. No distress. 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  HENT:  Head: Normocephalic and atraumatic.  Eyes: No scleral icterus.  Musculoskeletal: Angela Bryant exhibits no edema.  + pain with flexion of the right wrist. Also has pain in the right brachioradialis with flexion of right wrist  Neurological: Angela Bryant is alert and oriented to person, place, and time.  Skin: Skin is warm and dry.  Erythematous rash noted on face and neck.    Psychiatric:  Moderately anxious          Assessment & Plan:  Rash- I suspect poison ivy even though Angela Bryant does not remember contact.  Angela Bryant has tolerated depomedrol in the past.  Will rx with a medrol dose pak.  Pt is advised to continue her antihistamine. Call if new/worsening  symptoms or if symptoms do not continue to improve.   Musculoskeletal pain- right forearm/wrist. Pt was given a wrist brace to wear for the next few weeks. Would add NSAID if we were not treating with steroids for rash. Advised pt to let me know if symptoms are not improved in the next few weeks and we will refer her to sports medicine.  I offered to do so today but Angela Bryant declined.

## 2016-09-18 NOTE — Patient Instructions (Addendum)
Please wear right wrist brace as much as possible for the next 2 weeks.  Call if right arm pain worsens or if not improved in 2 weeks.  Begin medrol dose pak for rash.   Continue xyzal. You may take a tablet of benadryl at night if severe itching. Call if rash worsens or if not improved in 1 week.

## 2016-09-28 ENCOUNTER — Other Ambulatory Visit: Payer: Self-pay | Admitting: Family Medicine

## 2016-09-28 DIAGNOSIS — G4700 Insomnia, unspecified: Secondary | ICD-10-CM

## 2016-09-30 ENCOUNTER — Other Ambulatory Visit: Payer: Self-pay | Admitting: Family Medicine

## 2016-09-30 ENCOUNTER — Encounter: Payer: Self-pay | Admitting: Family Medicine

## 2016-09-30 ENCOUNTER — Other Ambulatory Visit: Payer: 59

## 2016-09-30 DIAGNOSIS — Z0283 Encounter for blood-alcohol and blood-drug test: Secondary | ICD-10-CM

## 2016-09-30 MED FILL — ZOLPIDEM TARTRATE 10 MG TAB: 10 | 30 days supply | Qty: 30 | Fill #0

## 2016-09-30 NOTE — Telephone Encounter (Signed)
Last office visit on 11/2015-- no future appointments Last refill was on #30 on 08/30/2016 No uds/no contract

## 2016-09-30 NOTE — Telephone Encounter (Signed)
Relation to YN:WGNF Call back number:[phone] Pharmacy: [id number] - [place], [place] - [address] AT [facility] [address] & [address] [phone] (Phone) [phone] (Fax)     Reason for call:  Patient checking on the status of zolpidem (AMBIEN) 10 MG tablet, patient would like to Rx today, please advise

## 2016-09-30 NOTE — Telephone Encounter (Signed)
Ok to refill but will need contract and uds-- it may need to be explained to her

## 2016-09-30 NOTE — Telephone Encounter (Signed)
Pt called says she needs ASAP she has to go to work at 1:00 then she gets off work after [employer] closes so she cannot get it after work. Ambien.

## 2016-09-30 NOTE — Telephone Encounter (Signed)
Printed prescription/PCP signed/patient contacted to pickup hardcopy/complete contact and do UDS.

## 2016-09-30 NOTE — Telephone Encounter (Signed)
Error

## 2016-09-30 NOTE — Telephone Encounter (Signed)
Patient did come by the office/completed UDS/contract. But requested due to lack of waiting time wanted [place] to telephone in the refill for Ambien to [facility]. [facility] to [facility] this refill.

## 2016-10-01 LAB — PAIN MGMT, PROFILE 8 W/CONF, U
6 Acetylmorphine: NEGATIVE ng/mL (ref <10)
AMPHETAMINES: NEGATIVE ng/mL (ref <500)
Alcohol Metabolites: NEGATIVE ng/mL (ref <500)
BENZODIAZEPINES: NEGATIVE ng/mL (ref <100)
BUPRENORPHINE, URINE: NEGATIVE ng/mL (ref <5)
CREATININE: 18 mg/dL — AB
Cocaine Metabolite: NEGATIVE ng/mL (ref <150)
MDMA: NEGATIVE ng/mL (ref <500)
Marijuana Metabolite: NEGATIVE ng/mL (ref <20)
OXIDANT: NEGATIVE ug/mL (ref <200)
OXYCODONE: NEGATIVE ng/mL (ref <100)
Opiates: NEGATIVE ng/mL (ref <100)
PH: 6.61 (ref 4.5–9.0)
Specific Gravity: 1.003 (ref >=1.0)

## 2016-10-07 ENCOUNTER — Telehealth: Payer: Self-pay | Admitting: Family Medicine

## 2016-10-07 DIAGNOSIS — R21 Rash and other nonspecific skin eruption: Secondary | ICD-10-CM

## 2016-10-07 MED ORDER — METHYLPREDNISOLONE 4 MG PO TABS: ORAL_TABLET | ORAL | 0 refills | Status: DC

## 2016-10-07 NOTE — Telephone Encounter (Signed)
Pt says that she was prescribed something for her skin and the rash that she had on her visit. Pt says that it is not working. Pt would like to be advised further. On what she should do?   Please call back at:  CB: [phone]

## 2016-10-07 NOTE — Telephone Encounter (Signed)
Pt called back stating rash is the same, no worse, no better. Please advise?

## 2016-10-07 NOTE — Telephone Encounter (Signed)
Left message for pt to return my call.

## 2016-10-07 NOTE — Telephone Encounter (Signed)
Pt reports that her symptoms resolved while she was on medrol dose pak then came back. She has appointment with [facility] but not until November. Will extend medrol pak. Nicki Guadalajara, could you please contact [facility] and see if you can get her in sooner?

## 2016-10-08 NOTE — Telephone Encounter (Signed)
Pt called checking status of melissa calling dr [facility] for appt asap. Please call pt. She states we were going to call this morning and let her know about quick dermatology appt with [facility].

## 2016-10-08 NOTE — Telephone Encounter (Signed)
Spoke with Dr Dorita Sciara office and pt is currently scheduled for 11/06/16 at 9:10am. I expressed need of earlier appt and they have rescheduled pt for 10/15/16 at 10:40am. Attempted to notify pt and left message for pt to return my call.

## 2016-10-08 NOTE — Telephone Encounter (Signed)
Pt returned my call and was notified by front office staff of below appt change and that Rx was sent to pharmacy by [facility] last night.

## 2016-10-15 DIAGNOSIS — L309 Dermatitis, unspecified: Secondary | ICD-10-CM | POA: Diagnosis not present

## 2016-10-17 ENCOUNTER — Other Ambulatory Visit: Payer: Self-pay | Admitting: Gastroenterology

## 2016-10-19 DIAGNOSIS — H00014 Hordeolum externum left upper eyelid: Secondary | ICD-10-CM | POA: Diagnosis not present

## 2016-10-25 ENCOUNTER — Other Ambulatory Visit: Payer: Self-pay | Admitting: Gastroenterology

## 2016-10-29 ENCOUNTER — Other Ambulatory Visit: Payer: Self-pay | Admitting: Family Medicine

## 2016-10-29 DIAGNOSIS — G4700 Insomnia, unspecified: Secondary | ICD-10-CM

## 2016-10-29 NOTE — Telephone Encounter (Signed)
Requesting:    zolpidem Contract     09/30/2016 UDS    done Last OV    12/12/2015  -- needs appt. Last Refill    #30 on 09/30/2016  Please Advise

## 2016-10-29 NOTE — Telephone Encounter (Signed)
Pt return called wanting to know about rx since pt is out of meds. Please advise ASAP.

## 2016-10-30 ENCOUNTER — Other Ambulatory Visit: Payer: Self-pay | Admitting: Emergency Medicine

## 2016-10-30 DIAGNOSIS — G4700 Insomnia, unspecified: Secondary | ICD-10-CM

## 2016-10-30 NOTE — Telephone Encounter (Signed)
Tried faxing Rx to pharmacy for pt multiple times on 10/29/16. Pt called back at 8:30 on 10/30/16 stating that Rx still wasn't received by pharmacy. Re-faxed Rx again to pharmacy.

## 2016-10-30 NOTE — Telephone Encounter (Signed)
Called the pharmacy this morning 10/30/2016 and gave a verbal refill Pharmacy had not received the fax. They will contact the patient it is ready.

## 2016-10-30 NOTE — Telephone Encounter (Signed)
NCCSR: Dr. Laury AxonLowne gives her ambien monthly.  However called pharmacy- this was already refilled this am by Dr. Laury AxonLowne

## 2016-10-30 NOTE — Telephone Encounter (Signed)
[facility]  Tammy SoursGreg called to say they did not receive this refill request. Could we possible resend?

## 2016-11-01 DIAGNOSIS — J3081 Allergic rhinitis due to animal (cat) (dog) hair and dander: Secondary | ICD-10-CM | POA: Diagnosis not present

## 2016-11-01 DIAGNOSIS — J3089 Other allergic rhinitis: Secondary | ICD-10-CM | POA: Diagnosis not present

## 2016-11-01 DIAGNOSIS — J4530 Mild persistent asthma, uncomplicated: Secondary | ICD-10-CM | POA: Diagnosis not present

## 2016-11-01 DIAGNOSIS — L309 Dermatitis, unspecified: Secondary | ICD-10-CM | POA: Diagnosis not present

## 2016-11-01 DIAGNOSIS — J301 Allergic rhinitis due to pollen: Secondary | ICD-10-CM | POA: Diagnosis not present

## 2016-11-19 ENCOUNTER — Telehealth: Payer: Self-pay | Admitting: Family Medicine

## 2016-11-19 DIAGNOSIS — G4700 Insomnia, unspecified: Secondary | ICD-10-CM

## 2016-11-19 NOTE — Telephone Encounter (Signed)
Patient request Angela Bryant  Database ran and is on your desk  Last filled per database: 10/30/16 Last written: 10/29/16 Last ov: 08/14/15 Next ov: due for follow up Contract: 10/01/16 UDS: 10/01/16

## 2016-11-19 NOTE — Telephone Encounter (Signed)
Will need early refill on [place], due to going out of town. [facility] [address]

## 2016-11-20 ENCOUNTER — Other Ambulatory Visit: Payer: Self-pay | Admitting: Family Medicine

## 2016-11-20 DIAGNOSIS — G4700 Insomnia, unspecified: Secondary | ICD-10-CM

## 2016-11-20 NOTE — Telephone Encounter (Signed)
Refill 1 month only Ov due

## 2016-11-21 MED ORDER — ZOLPIDEM TARTRATE 10 MG PO TABS: 10.0000 mg | ORAL_TABLET | Freq: Every day | ORAL | 0 refills | Status: DC

## 2016-11-21 NOTE — Telephone Encounter (Signed)
Patient notified.  She will make an appt.  [facility]

## 2016-11-21 NOTE — Telephone Encounter (Signed)
rx printed awaiting signature 

## 2016-11-22 ENCOUNTER — Other Ambulatory Visit: Payer: Self-pay

## 2016-11-22 NOTE — Telephone Encounter (Signed)
Pt says that she took her Rx to the pharmacy and was told that they now need a verbal okay to fill Rx.    Pt says that she is leaving to go out of town today so she would like to know if someone could call Rx sooner than later if possible.

## 2016-11-22 NOTE — Telephone Encounter (Signed)
[facility] called to confirm it being alright to fill Rx. I stated that they can HOWEVER Pt should be reminded that she will need an appointment for any future refills. Pharm Tech Rosey Batheresa stated she's relay the message. Rx will be filled today.

## 2016-12-01 ENCOUNTER — Other Ambulatory Visit: Payer: Self-pay | Admitting: Gastroenterology

## 2016-12-09 ENCOUNTER — Ambulatory Visit: Payer: 59 | Admitting: Family

## 2016-12-10 ENCOUNTER — Encounter: Payer: Self-pay | Admitting: Family Medicine

## 2016-12-10 ENCOUNTER — Other Ambulatory Visit: Payer: Self-pay

## 2016-12-10 ENCOUNTER — Ambulatory Visit: Payer: 59 | Admitting: Family Medicine

## 2016-12-10 VITALS — BP 128/84 | HR 88 | Temp 98.5°F | Ht 66.0 in | Wt 164.0 lb

## 2016-12-10 DIAGNOSIS — J45901 Unspecified asthma with (acute) exacerbation: Secondary | ICD-10-CM

## 2016-12-10 DIAGNOSIS — J4520 Mild intermittent asthma, uncomplicated: Secondary | ICD-10-CM | POA: Diagnosis not present

## 2016-12-10 DIAGNOSIS — G4700 Insomnia, unspecified: Secondary | ICD-10-CM | POA: Diagnosis not present

## 2016-12-10 LAB — POCT INFLUENZA A/B
Influenza A, POC: NEGATIVE
Influenza B, POC: NEGATIVE

## 2016-12-10 MED ORDER — ZOLPIDEM TARTRATE 10 MG PO TABS: 10.0000 mg | ORAL_TABLET | Freq: Every day | ORAL | 1 refills | Status: DC

## 2016-12-10 MED ORDER — ALBUTEROL SULFATE (2.5 MG/3ML) 0.083% IN NEBU
2.5000 mg | INHALATION_SOLUTION | Freq: Once | RESPIRATORY_TRACT | Status: AC
Start: 2016-12-10 — End: 2016-12-10
  Administered 2016-12-10: 2.5 mg via RESPIRATORY_TRACT

## 2016-12-10 MED ORDER — ALBUTEROL SULFATE HFA 108 (90 BASE) MCG/ACT IN AERS: 2.0000 | INHALATION_SPRAY | Freq: Four times a day (QID) | RESPIRATORY_TRACT | 2 refills | Status: DC | PRN

## 2016-12-10 MED ORDER — PROMETHAZINE-DM 6.25-15 MG/5ML PO SYRP: 5.0000 mL | ORAL_SOLUTION | Freq: Four times a day (QID) | ORAL | 0 refills | Status: DC | PRN

## 2016-12-10 MED ORDER — PREDNISONE 10 MG PO TABS: ORAL_TABLET | ORAL | 0 refills | Status: DC

## 2016-12-10 MED ORDER — AZITHROMYCIN 250 MG PO TABS: ORAL_TABLET | ORAL | 0 refills | Status: DC

## 2016-12-10 MED ORDER — IBUPROFEN 800 MG PO TABS: 800.0000 mg | ORAL_TABLET | Freq: Three times a day (TID) | ORAL | 1 refills | Status: DC | PRN

## 2016-12-10 NOTE — Patient Instructions (Signed)

## 2016-12-10 NOTE — Progress Notes (Signed)
Patient ID: Angela PoundStephanie F Sanderfer, female    DOB: 01/31/1980  Age: 36 y.o. MRN: [id number]    Subjective:  Subjective  HPI Angela PoundStephanie F Sedlak presents for cough and congestion x 2 days.  No fever,  + chills ---    Pt has been taking tylenol and Ib -----  Review of Systems  Constitutional: Positive for chills. Negative for fever.  HENT: Positive for congestion, postnasal drip, rhinorrhea and sinus pressure.   Respiratory: Positive for cough, chest tightness, shortness of breath and wheezing.   Cardiovascular: Negative for chest pain, palpitations and leg swelling.  Allergic/Immunologic: Negative for environmental allergies.    History Past Medical History:  Diagnosis Date  . Anxiety   . Asthma   . GERD (gastroesophageal reflux disease)   . Insomnia     She has a past surgical history that includes Anal Rectal manometry (N/A, 01/04/2015).   Her family history includes Asthma in her mother; Hypertension in her father.She reports that she has been smoking cigarettes.  she has never used smokeless tobacco. She reports that she drinks alcohol. She reports that she does not use drugs.  Current Outpatient Medications on File Prior to Visit  Medication Sig Dispense Refill  . ADVAIR DISKUS 250-50 MCG/DOSE AEPB Inhale 1 puff into the lungs at bedtime.     . cyclobenzaprine (FLEXERIL) 10 MG tablet TAKE 1 TABLET BY MOUTH THREE TIMES DAILY 30 tablet 0  . fluticasone (FLONASE) 50 MCG/ACT nasal spray Place 2 sprays into both nostrils daily. 16 g 6  . levocetirizine (XYZAL) 5 MG tablet Take 1 tablet by mouth at bedtime.    Marland Kitchen. levonorgestrel (MIRENA) 20 MCG/24HR IUD 1 each by Intrauterine route once.    Marland Kitchen. LINZESS 145 MCG CAPS capsule TAKE 1 CAPSULE BY MOUTH EVERY MORNING ON EMPTY STOMACH 30 capsule 0  . methylPREDNISolone (MEDROL) 4 MG tablet 1 tab every 6 hours for 2 days, then 1 tab every 8 hours for 2 days then 1 tab once daily for 2 days 16 tablet 0  . montelukast (SINGULAIR) 10 MG tablet Take 1  tablet by mouth at bedtime.    Marland Kitchen. omeprazole (PRILOSEC) 40 MG capsule Take 1 capsule (40 mg total) by mouth daily. 90 capsule 3   No current facility-administered medications on file prior to visit.      Objective:  Objective  Physical Exam  Constitutional: She is oriented to person, place, and time. She appears well-developed and well-nourished.  HENT:  Right Ear: External ear normal.  Left Ear: External ear normal.  + PND + errythema  Eyes: Conjunctivae are normal. Right eye exhibits no discharge. Left eye exhibits no discharge.  Cardiovascular: Normal rate, regular rhythm and normal heart sounds.  No murmur heard. Pulmonary/Chest: Effort normal. No respiratory distress. She has decreased breath sounds. She has no wheezes. She has no rales.     She exhibits no tenderness.  Musculoskeletal: She exhibits no edema.  Lymphadenopathy:    She has cervical adenopathy.  Neurological: She is alert and oriented to person, place, and time.  Psychiatric: She has a normal mood and affect. Her behavior is normal. Judgment and thought content normal.  Nursing note and vitals reviewed.  BP 128/84   Pulse 88   Temp 98.5 F (36.9 C) (Oral)   Ht 5\' 6"  (1.676 m)   Wt 164 lb (74.4 kg)   BMI 26.47 kg/m  Wt Readings from Last 3 Encounters:  12/10/16 164 lb (74.4 kg)  09/18/16 163 lb 9.6 oz (  74.2 kg)  07/08/16 163 lb 6.4 oz (74.1 kg)     Lab Results  Component Value Date   WBC 7.4 09/21/2015   HGB 12.0 09/21/2015   HCT 35.9 (L) 09/21/2015   PLT 238.0 09/21/2015   GLUCOSE 89 12/21/2013   ALT 14 12/21/2013   AST 17 12/21/2013   NA 137 12/21/2013   K 4.4 12/21/2013   CL 105 12/21/2013   CREATININE 0.7 12/21/2013   BUN 9 12/21/2013   CO2 25 12/21/2013   TSH 0.85 12/23/2012    Dg Abd 1 View  Result Date: 01/03/2015 CLINICAL DATA:  Six months of chronic constipation, history of irritable bowel disease, Sitz marker study. EXAM: ABDOMEN - 1 VIEW COMPARISON:  No recent examinations  in PACs FINDINGS: There are 8 Sitz markers centered over the mid and left aspect of the pelvis. Radiodense tablets project over the left sacral ale a in the left iliac crest. The colonic stool burden is increased. There is no small or large bowel obstructive pattern. There are no abnormal soft tissue calcifications. The bony structures are unremarkable. IMPRESSION: There are 8 Sitz markers visible centered over the distal descending colon as well as in the rectosigmoid. There are also radiodense tablets present. Increased colonic stool burden diffusely is consistent with clinical constipation. Electronically Signed   By: David  SwazilandJordan M.D.   On: 01/03/2015 14:01     Assessment & Plan:  Plan  I have changed Burnett CorrenteStephanie F. Nazaryan's zolpidem. I am also having her start on azithromycin, promethazine-dextromethorphan, and predniSONE. Additionally, I am having her maintain her ADVAIR DISKUS, montelukast, levocetirizine, omeprazole, fluticasone, levonorgestrel, cyclobenzaprine, methylPREDNISolone, LINZESS, and albuterol. We administered albuterol.  Meds ordered this encounter  Medications  . albuterol (PROVENTIL HFA;VENTOLIN HFA) 108 (90 Base) MCG/ACT inhaler    Sig: Inhale 2 puffs into the lungs every 6 (six) hours as needed. Reported on 02/01/2015    Dispense:  1 Inhaler    Refill:  2  . zolpidem (AMBIEN) 10 MG tablet    Sig: Take 1 tablet (10 mg total) by mouth at bedtime.    Dispense:  30 tablet    Refill:  1  . albuterol (PROVENTIL) (2.5 MG/3ML) 0.083% nebulizer solution 2.5 mg  . azithromycin (ZITHROMAX Z-PAK) 250 MG tablet    Sig: As directed    Dispense:  6 each    Refill:  0  . promethazine-dextromethorphan (PROMETHAZINE-DM) 6.25-15 MG/5ML syrup    Sig: Take 5 mLs by mouth 4 (four) times daily as needed.    Dispense:  118 mL    Refill:  0  . predniSONE (DELTASONE) 10 MG tablet    Sig: TAKE 3 TABLETS PO QD FOR 3 DAYS THEN TAKE 2 TABLETS PO QD FOR 3 DAYS THEN TAKE 1 TABLET PO QD FOR 3 DAYS  THEN TAKE 1/2 TAB PO QD FOR 3 DAYS    Dispense:  20 tablet    Refill:  0    Problem List Items Addressed This Visit    None    Visit Diagnoses    Moderate asthma with acute exacerbation, unspecified whether persistent    -  Primary   Relevant Medications   albuterol (PROVENTIL HFA;VENTOLIN HFA) 108 (90 Base) MCG/ACT inhaler   albuterol (PROVENTIL) (2.5 MG/3ML) 0.083% nebulizer solution 2.5 mg (Completed)   predniSONE (DELTASONE) 10 MG tablet   Other Relevant Orders   POCT Influenza A/B (Completed)   Insomnia       Relevant Medications   zolpidem (AMBIEN) 10  MG tablet   Bronchitis, asthmatic, mild intermittent, uncomplicated       Relevant Medications   albuterol (PROVENTIL HFA;VENTOLIN HFA) 108 (90 Base) MCG/ACT inhaler   albuterol (PROVENTIL) (2.5 MG/3ML) 0.083% nebulizer solution 2.5 mg (Completed)   azithromycin (ZITHROMAX Z-PAK) 250 MG tablet   promethazine-dextromethorphan (PROMETHAZINE-DM) 6.25-15 MG/5ML syrup   predniSONE (DELTASONE) 10 MG tablet      Follow-up: Return if symptoms worsen or fail to improve.  Donato Schultz, DO

## 2016-12-11 ENCOUNTER — Telehealth: Payer: Self-pay | Admitting: Family Medicine

## 2016-12-11 NOTE — Telephone Encounter (Signed)
Please advise 

## 2016-12-11 NOTE — Telephone Encounter (Signed)
See previous telephone encounter. Pt asking for another medication for cough.

## 2016-12-11 NOTE — Telephone Encounter (Signed)
Patient was seen yesterday and diagnosed with bronchitis. She was given a cough syrup- but she feels it is not helping at all. Patient seems to be worse at night when she goes to sleep. She states she has tried the honey at night, she is elevating her head- she is frustrated with the cough.  Told patient I would forward her concern and see if her provider had any other help for her.

## 2016-12-11 NOTE — Telephone Encounter (Signed)
Copied from CRM [id number]. Topic: Quick Communication - Rx Refill/Question >> Dec 11, 2016  4:59 PM Darletta MollLander, Lumin L wrote: Has the patient contacted their pharmacy? No. (Agent: If no, request that the patient contact the pharmacy for the refill.) Preferred Pharmacy (with phone number or street name): Patient wants another script called in for bronchitis. Was seen yesterday and prescribed something but she wants an additional medication. Agent: Please be advised that RX refills may take up to 48 hours. We ask that you follow-up with your pharmacy.

## 2016-12-12 ENCOUNTER — Encounter: Payer: Self-pay | Admitting: Medical

## 2016-12-12 ENCOUNTER — Ambulatory Visit: Payer: Self-pay | Admitting: *Deleted

## 2016-12-12 ENCOUNTER — Telehealth: Payer: Self-pay | Admitting: Family Medicine

## 2016-12-12 ENCOUNTER — Ambulatory Visit (HOSPITAL_BASED_OUTPATIENT_CLINIC_OR_DEPARTMENT_OTHER)
Admission: RE | Admit: 2016-12-12 | Discharge: 2016-12-12 | Disposition: A | Discharge status: Home / Self Care | Payer: 59 | Source: Ambulatory Visit | Attending: Medical | Admitting: Medical

## 2016-12-12 ENCOUNTER — Ambulatory Visit: Payer: 59 | Admitting: Medical

## 2016-12-12 VITALS — BP 110/82 | HR 92 | Temp 98.3°F | Resp 16 | Wt 162.8 lb

## 2016-12-12 DIAGNOSIS — J9811 Atelectasis: Secondary | ICD-10-CM | POA: Insufficient documentation

## 2016-12-12 DIAGNOSIS — R062 Wheezing: Secondary | ICD-10-CM | POA: Insufficient documentation

## 2016-12-12 DIAGNOSIS — R05 Cough: Secondary | ICD-10-CM | POA: Diagnosis not present

## 2016-12-12 DIAGNOSIS — R509 Fever, unspecified: Secondary | ICD-10-CM

## 2016-12-12 DIAGNOSIS — R059 Cough, unspecified: Secondary | ICD-10-CM

## 2016-12-12 DIAGNOSIS — R0602 Shortness of breath: Secondary | ICD-10-CM | POA: Diagnosis not present

## 2016-12-12 LAB — CBC WITH DIFFERENTIAL/PLATELET
Basophils Absolute: 0 10*3/uL (ref 0.0–0.1)
Basophils Relative: 0.4 % (ref 0.0–3.0)
EOS ABS: 0.1 10*3/uL (ref 0.0–0.7)
Eosinophils Relative: 0.7 % (ref 0.0–5.0)
HEMATOCRIT: 37 % (ref 36.0–46.0)
Hemoglobin: 12.3 g/dL (ref 12.0–15.0)
LYMPHS PCT: 24.5 % (ref 12.0–46.0)
Lymphs Abs: 2.2 10*3/uL (ref 0.7–4.0)
MCHC: 33.3 g/dL (ref 30.0–36.0)
MCV: 92.3 fl (ref 78.0–100.0)
Monocytes Absolute: 0.9 10*3/uL (ref 0.1–1.0)
Monocytes Relative: 9.8 % (ref 3.0–12.0)
NEUTROS ABS: 5.8 10*3/uL (ref 1.4–7.7)
Neutrophils Relative %: 64.6 % (ref 43.0–77.0)
PLATELETS: 197 10*3/uL (ref 150.0–400.0)
RBC: 4 Mil/uL (ref 3.87–5.11)
RDW: 15.1 % (ref 11.5–15.5)
WBC: 9 10*3/uL (ref 4.0–10.5)

## 2016-12-12 MED ORDER — BENZONATATE 100 MG PO CAPS
100.0000 mg | ORAL_CAPSULE | Freq: Three times a day (TID) | ORAL | 0 refills | Status: DC | PRN
Start: 2016-12-12 — End: 2017-02-06

## 2016-12-12 MED ORDER — METHYLPREDNISOLONE ACETATE 40 MG/ML IJ SUSP
40.0000 mg | Freq: Once | INTRAMUSCULAR | Status: AC
  Administered 2016-12-12: 40 mg via INTRAMUSCULAR

## 2016-12-12 NOTE — Telephone Encounter (Signed)
Duplicate telephone note.  

## 2016-12-12 NOTE — Telephone Encounter (Signed)
Notified pt if symptoms get worse before appointment go to ED.

## 2016-12-12 NOTE — Telephone Encounter (Signed)
Tessalon perles 200 mg 1 tid prn cough  #30

## 2016-12-12 NOTE — Telephone Encounter (Signed)
Tessalon sent by [name] already.

## 2016-12-12 NOTE — Telephone Encounter (Addendum)
See initial telephone encounter which occurred prior dated 12/12/16; pt feels like cough medicine would help but given ongoing chest tightness that is not relieved and frequent use of ventolin inhaler, iniated nurse triage; will notify [facility] of this matter; pt also questioned if an additional copayment would be required since she was seen 2 days ago; explained to pt that I was not sure of billing concerns but she could speak with someone upon arrival at appointment; also explained to pt not to let cost preclude her need for further assessment and care; pt accepted 1300 appointment today with Dr Vernona RiegerSaueir and verbalizes understanding; [facility] Reason for Disposition . [1] Continuous (nonstop) coughing interferes with work or school AND [2] no improvement using cough treatment per protocol  Answer Assessment - Initial Assessment Questions 1. ONSET: "When did the cough begin?"      Monday 12/09/16 2. SEVERITY: "How bad is the cough today?"      bad 3. RESPIRATORY DISTRESS: "Describe your breathing."      Chest tightness 4. FEVER: "Do you have a fever?" If so, ask: "What is your temperature, how was it measured, and when did it start?"     Unsure, not taken temp but having periods of sweating 5. HEMOPTYSIS: "Are you coughing up any blood?" If so ask: "How much?" (flecks, streaks, tablespoons, etc.)     no 6. TREATMENT: "What have you done so far to treat the cough?" (e.g., meds, fluids, humidifier)     Cough meds andinhaler 7. CARDIAC HISTORY: "Do you have any history of heart disease?" (e.g., heart attack, congestive heart failure)      no 8. LUNG HISTORY: "Do you have any history of lung disease?"  (e.g., pulmonary embolus, asthma, emphysema)     Asthma since [age] years old 419. PE RISK FACTORS: "Do you have a history of blood clots?" (or: recent major surgery, recent prolonged travel, bedridden )    no 10. OTHER SYMPTOMS: "Do you have any other symptoms?  (e.g., runny nose, wheezing, chest pain)       Chest tightness and runny nose; initially clear drainage but turned yellow yesterday  11. PREGNANCY: "Is there any chance you are pregnant?" "When was your last menstrual period?"      No; IUD 12. TRAVEL: "Have you traveled out of the country in the last month?" (e.g., travel history, exposures)      [place], [place] and [place] 1[place]5 weeks ago  Protocols used: COUGH - ACUTE NON-PRODUCTIVE-A-AH

## 2016-12-12 NOTE — Telephone Encounter (Signed)
tussionex 1 tsp po qhs prn #6oz

## 2016-12-12 NOTE — Telephone Encounter (Signed)
Patient states she wants some type of medication for the day time. She states if its medication that she needs for the night it may not work.   Please advise

## 2016-12-12 NOTE — Patient Instructions (Signed)
For your persistent bronchitis type symptoms with wheezing despite recent treatment, I will get x-ray of her chest today and blood work to assess your infection fighting cells.  For the wheezing component, I do think it would be beneficial to give you Depo-Medrol 40 mg IM injection today.  After reviewing the x-ray and labs, I might recommend modifying the taper dose regimen of prednisone.  Continue Advair and albuterol.  If you have severe worsening wheezing or breathing then seen at the emergency department.  Continue current antibiotic.  After I reviewed x-ray might add on antibiotic if pneumonia seen.  For cough I am prescribing benzonatate tablets but at the high dose.  I think most of the cough might be associated with airway tightness/wheezing.  Medication options are limited due to codeine side effects and history of itching with hydrocodone.  Work note to return to work on Saturday.  Follow-up 4-5 days or as needed

## 2016-12-12 NOTE — Progress Notes (Signed)
Subjective:    Patient ID: Angela Bryant, female    DOB: September 14, 1980, 36 y.o.   MRN: [id number]  HPI  Pt in for follow up.   She states she feels little better. But she is still coughing. Pt states her cough is still moderate to severe. Pt states less nasal congestion. Pt states progressively getting better compared to before. Pt was given taper dose of prednisone starting at 30 mg a day. Cough not improving with promethazine cough syrup.   Pt is still using her advair.   Pt having to use albuterol about every 6 hours.  Pt started azithromycin pack.  LMP- presently.(mirena)  Pt feels like at times can't take full deep breath.  Subjective fever.  Pt on last day of her 3 tab day prednisone.  Pt states codeine made her nausea. Itching with hydrocodone. Pt states benzonatate did not help much but she is not sure what dose she may have been given.   Review of Systems  Constitutional: Positive for fatigue and fever. Negative for chills.       Subjective fever  HENT: Negative for congestion, ear discharge, facial swelling, mouth sores, postnasal drip, rhinorrhea, sinus pressure, sinus pain and sore throat.   Respiratory: Positive for cough and wheezing. Negative for chest tightness and shortness of breath.   Cardiovascular: Negative for chest pain and palpitations.  Gastrointestinal: Negative for abdominal pain and anal bleeding.  Musculoskeletal: Negative for back pain and myalgias.       No leg pain.  Skin: Negative for rash.  Neurological: Positive for headaches. Negative for dizziness, seizures, speech difficulty and weakness.       Mild ha when coughs.  Hematological: Negative for adenopathy. Does not bruise/bleed easily.  Psychiatric/Behavioral: Negative for behavioral problems and confusion.   Past Medical History:  Diagnosis Date  . Anxiety   . Asthma   . GERD (gastroesophageal reflux disease)   . Insomnia      Social History   Socioeconomic History  .  Marital status: Married    Spouse name: Not on file  . Number of children: 0  . Years of education: Not on file  . Highest education level: Not on file  Social Needs  . Financial resource strain: Not on file  . Food insecurity - worry: Not on file  . Food insecurity - inability: Not on file  . Transportation needs - medical: Not on file  . Transportation needs - non-medical: Not on file  Occupational History  . Occupation: t Firefighter: [employer]  Tobacco Use  . Smoking status: Light Tobacco Smoker    Types: Cigarettes  . Smokeless tobacco: Never Used  . Tobacco comment: rarely has a cigarrette, sometimes on weekends  Substance and Sexual Activity  . Alcohol use: Yes    Alcohol/week: 0.0 oz    Comment: occasional   . Drug use: No  . Sexual activity: Yes    Partners: Male  Other Topics Concern  . Not on file  Social History Narrative  . Not on file    Past Surgical History:  Procedure Laterality Date  . ANAL RECTAL MANOMETRY N/A 01/04/2015   Procedure: ANO RECTAL MANOMETRY;  Surgeon: Napoleon Form, MD;  Location: [facility];  Service: Endoscopy;  Laterality: N/A;    Family History  Problem Relation Age of Onset  . Hypertension Father   . Asthma Mother     Allergies  Allergen Reactions  . Codeine Nausea And Vomiting  Current Outpatient Medications on File Prior to Visit  Medication Sig Dispense Refill  . ADVAIR DISKUS 250-50 MCG/DOSE AEPB Inhale 1 puff into the lungs at bedtime.     Marland Kitchen. albuterol (PROVENTIL HFA;VENTOLIN HFA) 108 (90 Base) MCG/ACT inhaler Inhale 2 puffs into the lungs every 6 (six) hours as needed. Reported on 02/01/2015 1 Inhaler 2  . azithromycin (ZITHROMAX Z-PAK) 250 MG tablet As directed 6 each 0  . cyclobenzaprine (FLEXERIL) 10 MG tablet TAKE 1 TABLET BY MOUTH THREE TIMES DAILY 30 tablet 0  . fluticasone (FLONASE) 50 MCG/ACT nasal spray Place 2 sprays into both nostrils daily. 16 g 6  . ibuprofen (ADVIL,MOTRIN) 800 MG tablet Take  1 tablet (800 mg total) by mouth every 8 (eight) hours as needed. 90 tablet 1  . levocetirizine (XYZAL) 5 MG tablet Take 1 tablet by mouth at bedtime.    Marland Kitchen. levonorgestrel (MIRENA) 20 MCG/24HR IUD 1 each by Intrauterine route once.    Marland Kitchen. LINZESS 145 MCG CAPS capsule TAKE 1 CAPSULE BY MOUTH EVERY MORNING ON EMPTY STOMACH 30 capsule 0  . methylPREDNISolone (MEDROL) 4 MG tablet 1 tab every 6 hours for 2 days, then 1 tab every 8 hours for 2 days then 1 tab once daily for 2 days 16 tablet 0  . montelukast (SINGULAIR) 10 MG tablet Take 1 tablet by mouth at bedtime.    Marland Kitchen. omeprazole (PRILOSEC) 40 MG capsule Take 1 capsule (40 mg total) by mouth daily. 90 capsule 3  . predniSONE (DELTASONE) 10 MG tablet TAKE 3 TABLETS PO QD FOR 3 DAYS THEN TAKE 2 TABLETS PO QD FOR 3 DAYS THEN TAKE 1 TABLET PO QD FOR 3 DAYS THEN TAKE 1/2 TAB PO QD FOR 3 DAYS 20 tablet 0  . promethazine-dextromethorphan (PROMETHAZINE-DM) 6.25-15 MG/5ML syrup Take 5 mLs by mouth 4 (four) times daily as needed. 118 mL 0  . zolpidem (AMBIEN) 10 MG tablet Take 1 tablet (10 mg total) by mouth at bedtime. 30 tablet 1   No current facility-administered medications on file prior to visit.     BP 110/82   Pulse 92   Temp 98.3 F (36.8 C) (Oral)   Resp 16   Wt 162 lb 12.5 oz (73.8 kg)   SpO2 98%   BMI 26.27 kg/m       Objective:   Physical Exam  General  Mental Status - Alert. General Appearance - Well groomed. Not in acute distress.  Skin Rashes- No Rashes.  HEENT Head- Normal. Ear Auditory Canal - Left- Normal. Right - Normal.Tympanic Membrane- Left- Normal. Right- Normal. Eye Sclera/Conjunctiva- Left- Normal. Right- Normal. Nose & Sinuses Nasal Mucosa- Left-  Boggy and Congested. Right-  Boggy and  Congested.Bilateral no  maxillary and no  frontal sinus pressure. Mouth & Throat Lips: Upper Lip- Normal: no dryness, cracking, pallor, cyanosis, or vesicular eruption. Lower Lip-Normal: no dryness, cracking, pallor, cyanosis or  vesicular eruption. Buccal Mucosa- Bilateral- No Aphthous ulcers. Oropharynx- No Discharge or Erythema. Tonsils: Characteristics- Bilateral- No Erythema or Congestion. Size/Enlargement- Bilateral- No enlargement. Discharge- bilateral-None.  Neck Neck- Supple. No Masses.   Chest and Lung Exam Auscultation: Breath Sounds:-Clear even and unlabored.    Cardiovascular Auscultation:Rythm- Regular, rate and rhythm. Murmurs & Other Heart Sounds:Ausculatation of the heart reveal- No Murmurs.  Lymphatic Head & Neck General Head & Neck Lymphatics: Bilateral: Description- No Localized lymphadenopathy.       Assessment & Plan:  For your persistent bronchitis type symptoms with wheezing despite recent treatment, I will get  x-ray of her chest today and blood work to assess your infection fighting cells.  For the wheezing component, I do think it would be beneficial to give you Depo-Medrol 40 mg IM injection today.  After reviewing the x-ray and labs, I might recommend modifying the taper dose regimen of prednisone.  Continue Advair and albuterol.  If you have severe worsening wheezing or breathing then seen at the emergency department.  Continue current antibiotic.  After I reviewed x-ray might add on antibiotic if pneumonia seen.  For cough I am prescribing benzonatate tablets but at the high dose.  I think most of the cough might be associated with airway tightness/wheezing.  Medication options are limited due to codeine side effects and history of itching with hydrocodone.  Work note to return to work on Saturday.  Follow-up 4-5 days or as needed  I called pharmacy and change the 100 mg benzonatate to 200 mg.  I meant to write the higher dose and call my mistake.  Angle Karel, [name], PA-C

## 2016-12-12 NOTE — Telephone Encounter (Signed)
Patient would like to know if she could be refunded due to being misdiagnosed on pervious dos. Patient stts she was told by a nurse that she didn't haven't to pay.

## 2016-12-12 NOTE — Telephone Encounter (Signed)
Pt called stating that she was given a medication for cough that her aunt who is a nurse said was for nausea; She [facility] that [facility] not [facility]; pt seen by Dr Laury AxonLowne on 12/10/16; contacted [facility] at [phone] to verify if a prescription for tussionex was sent there on 12/13/26 but able to reach staff; [facility] and said prescription has not yet been sent and she will follow up with something for daytime cough; pt also states that she is also using inhaler more and that her chest hurting more; see new triage note

## 2016-12-14 ENCOUNTER — Other Ambulatory Visit: Payer: Self-pay | Admitting: Family Medicine

## 2016-12-16 ENCOUNTER — Telehealth: Payer: Self-pay | Admitting: *Deleted

## 2016-12-16 NOTE — Telephone Encounter (Signed)
Copied from CRM [phone]. Topic: Inquiry >> Dec 16, 2016  4:20 PM Moton, SalonaKelly, [place] wrote: Reason for WGN:[id number]:Patient called because she is still feeling bad but had to return to work. Patient has been feeling this way for over a week and would like to know what can she do to get better. If someone could please give her a call back.

## 2016-12-16 NOTE — Telephone Encounter (Signed)
Pt called in, she said that she is feeling a little better but still have congestion. Pt says that she have 2 more prednisone pills and she will be complete with all medication prescribed.   Pt would like to be advised further on what she could do going forward to feel better.

## 2016-12-17 ENCOUNTER — Telehealth: Payer: Self-pay | Admitting: Medical

## 2016-12-17 ENCOUNTER — Telehealth: Payer: Self-pay | Admitting: Family Medicine

## 2016-12-17 NOTE — Telephone Encounter (Signed)
FYI

## 2016-12-17 NOTE — Telephone Encounter (Signed)
Pt states that she is still achy, having head congestion, and coughing (took last prednisone today and she has been out of Z pack for days; pt states that she feels only 60% better; Fest[facility] stated per MDs note the pt needs to follow up in office; pt is concerned that she has had to pay 2 previous times for visits related to this illness; explained to pt that the goal is for her to get the care that she needs, and not to let money preclude her care; pt explained her scheduling limitations;pt offered and accepted an appointment with D Saguier on 12/18/16 at 0815; pt verbalizes understanding; will [facility] to make them aware of this upcoming appt

## 2016-12-17 NOTE — Telephone Encounter (Signed)
Patient has not heard back yet. Please advise. Also needs Ambien sent to [facility] [address] instead of [facility] on [address].

## 2016-12-17 NOTE — Telephone Encounter (Signed)
[facility] and they cannot order the brand she likes and would like rx resent to [facility] [address].  Can you resend?  Other message about her not feeling well has been taken care of. She will be seeing [employer] Office [employer] tomorrow.

## 2016-12-17 NOTE — Telephone Encounter (Signed)
See other note

## 2016-12-17 NOTE — Telephone Encounter (Signed)
Ok to resend [employer]

## 2016-12-17 NOTE — Telephone Encounter (Signed)
Copied from CRM #[id number]. Topic: Inquiry >> Dec 16, 2016  4:20 PM Moton, Kelly, NT wrote: Reason for CRM:Patient called because she is still feeling bad but had to return to work. Patient has been feeling this way for over a week and would like to know what can she do to get better. If someone could please give her a call back.  

## 2016-12-17 NOTE — Telephone Encounter (Signed)
I reviewed various phone notes after I saw pt my chart message today. I was not notified until late this afternoon regarding pt calls.

## 2016-12-17 NOTE — Telephone Encounter (Signed)
Patient notified

## 2016-12-17 NOTE — Telephone Encounter (Signed)
Does this patient need to be re-evaluated in office?  She is also requesting a controlled substance that involves a change in pharmacy; see CRM # [phone]

## 2016-12-18 ENCOUNTER — Encounter: Payer: Self-pay | Admitting: Medical

## 2016-12-18 ENCOUNTER — Ambulatory Visit (INDEPENDENT_AMBULATORY_CARE_PROVIDER_SITE_OTHER): Payer: 59 | Admitting: Medical

## 2016-12-18 ENCOUNTER — Ambulatory Visit: Payer: 59 | Admitting: Medical

## 2016-12-18 VITALS — BP 130/82 | HR 90 | Temp 98.0°F | Resp 16 | Ht 66.0 in | Wt 163.6 lb

## 2016-12-18 DIAGNOSIS — J40 Bronchitis, not specified as acute or chronic: Secondary | ICD-10-CM | POA: Diagnosis not present

## 2016-12-18 DIAGNOSIS — R062 Wheezing: Secondary | ICD-10-CM

## 2016-12-18 DIAGNOSIS — R05 Cough: Secondary | ICD-10-CM | POA: Diagnosis not present

## 2016-12-18 DIAGNOSIS — M791 Myalgia, unspecified site: Secondary | ICD-10-CM | POA: Diagnosis not present

## 2016-12-18 DIAGNOSIS — R059 Cough, unspecified: Secondary | ICD-10-CM

## 2016-12-18 MED ORDER — FLUCONAZOLE 150 MG PO TABS: 150.0000 mg | ORAL_TABLET | Freq: Once | ORAL | 0 refills | Status: AC

## 2016-12-18 MED ORDER — IPRATROPIUM BROMIDE HFA 17 MCG/ACT IN AERS: 2.0000 | INHALATION_SPRAY | Freq: Four times a day (QID) | RESPIRATORY_TRACT | 2 refills | Status: DC | PRN

## 2016-12-18 MED ORDER — METHYLPREDNISOLONE ACETATE 80 MG/ML IJ SUSP
80.0000 mg | Freq: Once | INTRAMUSCULAR | Status: AC
  Administered 2016-12-18: 80 mg via INTRAMUSCULAR

## 2016-12-18 MED ORDER — DOXYCYCLINE HYCLATE 100 MG PO TABS: 100.0000 mg | ORAL_TABLET | Freq: Two times a day (BID) | ORAL | 0 refills | Status: DC

## 2016-12-18 MED FILL — DOXYCYCLINE HYCLATE 100 MG: 100 | 7 days supply | Qty: 14 | Fill #0

## 2016-12-18 NOTE — Patient Instructions (Addendum)
After rechecking you today and reviewing your prior 2 visits, I think you very well may have had flu on the initial visit.  However when you first were evaluated you were probably at the upper limit of treatment timeframe or that timeframe probably had passed.  Also I think you have been suffering from secondary bronchitis with reactive airways.  For the wheezing that is persisting, we gave you Depo-Medrol injection 80 mg this time.  Continue her current inhalers and I sent him down prescription for Atrovent.  Hopefully Atrovent is not too expensive.  Prescription of doxycycline sent to your pharmacy as well.(Rx advised and given for doxycycline.)  Making Diflucan available if you would get a yeast infection.  Follow-up as needed.  Sorry you had to be seen 3 times for persisting.  You could my chart me sometime over the next 2-5 days and hopefully will give a good report.

## 2016-12-18 NOTE — Progress Notes (Signed)
Subjective:    Patient ID: Angela Bryant, female    DOB: 10/05/1980, 36 y.o.   MRN: [id number]  HPI  Pt states she still has some body aches, sweating, still congested and still has cough.  Last week Tuesday- Thursday felt awful. Since last visit she is about 60% better.  On 12-10-2016 flu test was negative. But pt mentions she felt so terrible she thought she had the flu.   Pt has used azithromycin. I kept her on that after she saw Dr. Laury AxonLowne.  She felt better with depomedrol 40 mg im.  Pt cough is still dry. Maybe one day since I saw her she had productive cough. Occasional wheeze.  Pt is using advair twice a day. Pt having to use albuterol once or twice a day.    Review of Systems  Constitutional: Positive for fatigue. Negative for chills and fever.       Mild fatigue.   HENT: Positive for congestion and postnasal drip. Negative for ear pain, sinus pressure and sinus pain.   Respiratory: Positive for cough and wheezing. Negative for chest tightness and shortness of breath.   Cardiovascular: Negative for chest pain and palpitations.  Gastrointestinal: Negative for abdominal distention, abdominal pain, constipation, diarrhea, nausea and vomiting.  Genitourinary: Negative for difficulty urinating, dysuria, flank pain, frequency, pelvic pain and urgency.  Musculoskeletal: Negative for back pain and gait problem.  Skin: Negative for rash.  Neurological: Negative for dizziness, syncope, speech difficulty, weakness, light-headedness, numbness and headaches.  Hematological: Negative for adenopathy. Does not bruise/bleed easily.  Psychiatric/Behavioral: Negative for behavioral problems, confusion and sleep disturbance. The patient is not nervous/anxious.     Past Medical History:  Diagnosis Date  . Anxiety   . Asthma   . GERD (gastroesophageal reflux disease)   . Insomnia      Social History   Socioeconomic History  . Marital status: Married    Spouse name: Not on file   . Number of children: 0  . Years of education: Not on file  . Highest education level: Not on file  Social Needs  . Financial resource strain: Not on file  . Food insecurity - worry: Not on file  . Food insecurity - inability: Not on file  . Transportation needs - medical: Not on file  . Transportation needs - non-medical: Not on file  Occupational History  . Occupation: t [employer]    Employer: [employer]  Tobacco Use  . Smoking status: Light Tobacco Smoker    Types: Cigarettes  . Smokeless tobacco: Never Used  . Tobacco comment: rarely has a cigarrette, sometimes on weekends  Substance and Sexual Activity  . Alcohol use: Yes    Alcohol/week: 0.0 oz    Comment: occasional   . Drug use: No  . Sexual activity: Yes    Partners: Male  Other Topics Concern  . Not on file  Social History Narrative  . Not on file    Past Surgical History:  Procedure Laterality Date  . ANAL RECTAL MANOMETRY N/A 01/04/2015   Procedure: ANO RECTAL MANOMETRY;  Surgeon: Napoleon FormKavitha Nandigam V, MD;  Location: [facility];  Service: Endoscopy;  Laterality: N/A;    Family History  Problem Relation Age of Onset  . Hypertension Father   . Asthma Mother     Allergies  Allergen Reactions  . Codeine Nausea And Vomiting    Current Outpatient Medications on File Prior to Visit  Medication Sig Dispense Refill  . ADVAIR DISKUS 250-50 MCG/DOSE  AEPB Inhale 1 puff into the lungs at bedtime.     Marland Kitchen albuterol (PROVENTIL HFA;VENTOLIN HFA) 108 (90 Base) MCG/ACT inhaler Inhale 2 puffs into the lungs every 6 (six) hours as needed. Reported on 02/01/2015 1 Inhaler 2  . azithromycin (ZITHROMAX Z-PAK) 250 MG tablet As directed 6 each 0  . benzonatate (TESSALON) 100 MG capsule Take 1 capsule (100 mg total) by mouth 3 (three) times daily as needed for cough. 21 capsule 0  . cyclobenzaprine (FLEXERIL) 10 MG tablet TAKE 1 TABLET BY MOUTH THREE TIMES DAILY 30 tablet 0  . cyclobenzaprine (FLEXERIL) 10 MG tablet TAKE 1 TABLET BY  MOUTH THREE TIMES DAILY AS NEEDED FOR MUSCLE SPASMS 90 tablet 0  . fluticasone (FLONASE) 50 MCG/ACT nasal spray Place 2 sprays into both nostrils daily. 16 g 6  . ibuprofen (ADVIL,MOTRIN) 800 MG tablet Take 1 tablet (800 mg total) by mouth every 8 (eight) hours as needed. 90 tablet 1  . levocetirizine (XYZAL) 5 MG tablet Take 1 tablet by mouth at bedtime.    Marland Kitchen levonorgestrel (MIRENA) 20 MCG/24HR IUD 1 each by Intrauterine route once.    Marland Kitchen LINZESS 145 MCG CAPS capsule TAKE 1 CAPSULE BY MOUTH EVERY MORNING ON EMPTY STOMACH 30 capsule 0  . methylPREDNISolone (MEDROL) 4 MG tablet 1 tab every 6 hours for 2 days, then 1 tab every 8 hours for 2 days then 1 tab once daily for 2 days 16 tablet 0  . montelukast (SINGULAIR) 10 MG tablet Take 1 tablet by mouth at bedtime.    Marland Kitchen omeprazole (PRILOSEC) 40 MG capsule Take 1 capsule (40 mg total) by mouth daily. 90 capsule 3  . predniSONE (DELTASONE) 10 MG tablet TAKE 3 TABLETS PO QD FOR 3 DAYS THEN TAKE 2 TABLETS PO QD FOR 3 DAYS THEN TAKE 1 TABLET PO QD FOR 3 DAYS THEN TAKE 1/2 TAB PO QD FOR 3 DAYS 20 tablet 0  . promethazine-dextromethorphan (PROMETHAZINE-DM) 6.25-15 MG/5ML syrup Take 5 mLs by mouth 4 (four) times daily as needed. 118 mL 0  . zolpidem (AMBIEN) 10 MG tablet Take 1 tablet (10 mg total) by mouth at bedtime. 30 tablet 1   No current facility-administered medications on file prior to visit.     BP 130/82   Pulse 90   Temp 98 F (36.7 C) (Oral)   Resp 16   Ht 5\' 6"  (1.676 m)   Wt 163 lb 9.6 oz (74.2 kg)   LMP 12/12/2016   SpO2 98%   BMI 26.41 kg/m       Objective:   Physical Exam   General  Mental Status - Alert. General Appearance - Well groomed. Not in acute distress.  Skin Rashes- No Rashes.  HEENT Head- Normal. Ear Auditory Canal - Left- Normal. Right - Normal.Tympanic Membrane- Left- Normal. Right- Normal. Eye Sclera/Conjunctiva- Left- Normal. Right- Normal. Nose & Sinuses Nasal Mucosa- Left-  Boggy and Congested.  Right-  Boggy and  Congested.Bilateral no  maxillary and  No frontal sinus pressure. Mouth & Throat Lips: Upper Lip- Normal: no dryness, cracking, pallor, cyanosis, or vesicular eruption. Lower Lip-Normal: no dryness, cracking, pallor, cyanosis or vesicular eruption. Buccal Mucosa- Bilateral- No Aphthous ulcers. Oropharynx- No Discharge or Erythema. Tonsils: Characteristics- Bilateral- No Erythema or Congestion. Size/Enlargement- Bilateral- No enlargement. Discharge- bilateral-None.  Neck Neck- Supple. No Masses.   Chest and Lung Exam Auscultation: Breath Sounds:-Clear even and unlabored.  Cardiovascular Auscultation:Rythm- Regular, rate and rhythm. Murmurs & Other Heart Sounds:Ausculatation of the heart reveal- No  Murmurs.  Lymphatic Head & Neck General Head & Neck Lymphatics: Bilateral: Description- No Localized lymphadenopathy.      Assessment & Plan:  After rechecking you today and reviewing your prior 2 visits, I think you very well may have had flu on the initial visit.  However when you first were evaluated you were probably at the upper limit of treatment timeframe or that timeframe probably had passed.  Also I think you have been suffering from secondary bronchitis with reactive airways.  For the wheezing that is persisting, we gave you Depo-Medrol injection 80 mg this time.  Continue her current inhalers and I sent him down prescription for Atrovent.  Hopefully Atrovent is not too expensive.  Prescription of doxycycline sent to your pharmacy as well.(Rx advised and given for doxycycline.)  Making Diflucan available if you would get a yeast infection.  Follow-up as needed.  Sorry you had to be seen 3 times for persisting.  You could my chart me sometime over the next 2-5 days and hopefully will give a good report.  Cathryne Mancebo, [name], PA-C

## 2016-12-30 ENCOUNTER — Encounter: Payer: Self-pay | Admitting: Nurse Practitioner

## 2016-12-30 ENCOUNTER — Ambulatory Visit: Payer: 59 | Admitting: Nurse Practitioner

## 2016-12-30 VITALS — BP 118/90 | HR 80 | Ht 66.0 in | Wt 161.6 lb

## 2016-12-30 DIAGNOSIS — K219 Gastro-esophageal reflux disease without esophagitis: Secondary | ICD-10-CM | POA: Diagnosis not present

## 2016-12-30 DIAGNOSIS — K5900 Constipation, unspecified: Secondary | ICD-10-CM

## 2016-12-30 NOTE — Progress Notes (Signed)
     Chief Complaint:  Constipation, lump in throat   HPI: Patient is a 36 year old female known to Dr. Christella HartiganJacobs who has followed her for constipation / anal fissure / hemorrhoids / GERD / dyspepsia.   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    Angela CornfieldStephanie is here for a "routine" check up. Still having a lot of GI problems.  She has had an extensive workup for constipation including colonoscopy, sitz markers study and anal manometry (normal). Still taking Linzess . We have increased dose in past but it caused diarrhea but the lower dose doesn't yield consistent results. Gets plenty of water and fiber. Tried Amitiza years ago, can't remember details but doesn't think it worked. Miralax didn't help either.    Friday night patient awoke from sleep vomiting, possibly because of something she ate for dinner. Since then she has had sensation of something in her throat. Throat scratchy, feels like a lump is in it. She takes a PPI but not everyday because she doesn't always remember to take it.    Past Medical History:  Diagnosis Date  . Anxiety   . Asthma   . GERD (gastroesophageal reflux disease)   . Insomnia     Patient's surgical history, family medical history, social history, medications and allergies were all reviewed in Epic    Physical Exam: BP 118/90   Pulse 80   Ht 5\' 6"  (1.676 m)   Wt 161 lb 9.6 oz (73.3 kg)   LMP 12/12/2016   BMI 26.08 kg/m   GENERAL:  Well developed white female in NAD PSYCH: :Pleasant, cooperative, normal affect EENT:  conjunctiva pink, mucous membranes moist, neck supple without masses CARDIAC:  RRR, no murmur heard, no peripheral edema PULM: Normal respiratory effort, lungs CTA bilaterally, no wheezing ABDOMEN:  Nondistended, soft, nontender. No obvious masses, no hepatomegaly,  normal bowel sounds SKIN:  turgor, no lesions seen Musculoskeletal:  Normal muscle tone, normal strength NEURO: Alert and oriented x 3, no focal  neurologic deficits    ASSESSMENT and PLAN:  1. Pleasant 36 year old female with chronic constipation. She had a normal colonoscopy in 2015. Anorectal manometry was normal. She has tried several medications for constipation through the years. She inquires about whether she needs repeat testing.  -I tried to reassure her that repeat testing in setting of same sx would likely be low yield.  -samples of Trulance given. We can call in Rx in she likes it   2. Globus. Started a few days ago after several episodes of vomiting. GERD could also be contributing. She doesn't remember to take PPI every day. No dysphagia -recommend she take PPI every day 30 minutes before breakfast until time of next follow up   Angela Bryant , NP 12/30/2016, 11:28 AM

## 2016-12-30 NOTE — Patient Instructions (Signed)
If you are age 35 or older, your body mass index should be between 23-30. Your Body mass index is 26.08 kg/m. If this is out of the aforementioned range listed, please consider follow up with your Primary Care Provider.  If you are age 35 or younger, your body mass index should be between 19-25. Your Body mass index is 26.08 kg/m. If this is out of the aformentioned range listed, please consider follow up with your Primary Care Provider.   Take Omeprazole in the morning 30 minutes before breakfast daily.  Trulance samples given - take one daily.  Call for a prescription if this helps.  Follow up with Dr. Christella HartiganJacobs in three months.  Will contact regarding an appointment when schedule becomes available.  Thank you for choosing me and [facility].   Willette ClusterPaula Guenther, NP

## 2017-01-01 ENCOUNTER — Encounter: Payer: Self-pay | Admitting: Nurse Practitioner

## 2017-01-02 NOTE — Progress Notes (Signed)
I agree with the above note, plan 

## 2017-01-08 ENCOUNTER — Other Ambulatory Visit: Payer: Self-pay

## 2017-01-08 ENCOUNTER — Telehealth: Payer: Self-pay

## 2017-01-08 ENCOUNTER — Telehealth: Payer: Self-pay | Admitting: Nurse Practitioner

## 2017-01-08 DIAGNOSIS — G4700 Insomnia, unspecified: Secondary | ICD-10-CM

## 2017-01-08 MED ORDER — PLECANATIDE 3 MG PO TABS: 3.0000 mg | ORAL_TABLET | Freq: Every day | ORAL | 3 refills | Status: DC

## 2017-01-08 NOTE — Telephone Encounter (Signed)
Patient called in stating that her insurance is requiring a prior auth for this medications. Best # [phone]

## 2017-01-08 NOTE — Telephone Encounter (Signed)
Copied from CRM [phone] [phone]. Topic: General - Other >> Jan 08, 2017  1:22 PM Angela Bryant, Colt Martelle, [place] wrote: Reason for CRM: pt [facility] [address] due to it being manufactured differently there and it has been causing her to break out in rash

## 2017-01-09 MED ORDER — ZOLPIDEM TARTRATE 10 MG PO TABS: 10.0000 mg | ORAL_TABLET | Freq: Every day | ORAL | 1 refills | Status: DC

## 2017-01-09 NOTE — Telephone Encounter (Signed)
Patient requesting refill for Remus Lofflerambien  Database ran on 11/19/16 and is in media  Last written: 12/10/16 Last ov: 12/10/16 Next ov: none Contract: 09/30/16 UDS: 09/30/17

## 2017-01-09 NOTE — Addendum Note (Signed)
Addended by: Abbe AmsterdamOPLAND, Maryori Weide C on: 01/09/2017 02:09 PM   Modules accepted: Orders

## 2017-01-09 NOTE — Telephone Encounter (Addendum)
Called pt- this rx was [facility] as they were using a different manufacturer and she thought it was causing a rash.  However, she thinks that she was wrong; she was given a different type of pill from [facility] ([facility] [place]) and her rash got worse.    She has been on [place] for years and has never had any allergy or rash with this in the past   Suggested that she stop using the [place]- however she does not feel it is possible for her to stop taking it. "then I won't be able to sleep at all." She does have a dermatologist who she will call for an appt.  In the meantime I changed her rx back to [facility] - explained the situation to pharmacy staff as she is too early for a routine RF today Again encouraged her to stop using ambien if the rash persists, and asked her to come and see us if not able to see her derm in a timely manner  Meds ordered this encounter  Medications  . zolpidem (AMBIEN) 10 MG tablet    Sig: Take 1 tablet (10 mg total) by mouth at bedtime.    Dispense:  30 tablet    Refill:  1

## 2017-01-13 DIAGNOSIS — R21 Rash and other nonspecific skin eruption: Secondary | ICD-10-CM | POA: Diagnosis not present

## 2017-01-13 NOTE — Telephone Encounter (Signed)
Does she need refill now?

## 2017-01-13 NOTE — Telephone Encounter (Signed)
rx filled by [facility]

## 2017-01-15 NOTE — Telephone Encounter (Signed)
Patient wanting to follow up on this process. Patient states insurance does not cover new medication and wants to her take linzess but the linzess does not work. Pt wanting to know if prior Berkley Harveyauth is done and if she can have samples until she can get a medication called in.

## 2017-01-16 NOTE — Telephone Encounter (Signed)
Patient calling back regarding this. She is also requesting samples because she is out of this medication. Best # [phone]

## 2017-01-16 NOTE — Telephone Encounter (Signed)
[facility]  Coverage denied.  I spoke with patient today and told her I would be calling to see what I needed to do to get this approved for her.  In the meantime, I told her to come by the office to pick up samples.  She states that Trulance works for her when nothing else has.

## 2017-01-16 NOTE — Telephone Encounter (Signed)
Patient calling back regarding this. Best # [phone]

## 2017-01-17 ENCOUNTER — Telehealth: Payer: Self-pay

## 2017-01-17 NOTE — Telephone Encounter (Signed)
Received approval for Trulance from [facility]. Pt aware. Pharmacy aware.  Approval scanned to chart.

## 2017-01-21 ENCOUNTER — Encounter: Payer: Self-pay | Admitting: Nurse Practitioner

## 2017-01-21 ENCOUNTER — Ambulatory Visit: Payer: 59 | Admitting: Nurse Practitioner

## 2017-01-21 VITALS — BP 124/86 | HR 83 | Temp 98.3°F | Ht 66.0 in | Wt 163.0 lb

## 2017-01-21 DIAGNOSIS — R221 Localized swelling, mass and lump, neck: Secondary | ICD-10-CM | POA: Diagnosis not present

## 2017-01-21 DIAGNOSIS — J029 Acute pharyngitis, unspecified: Secondary | ICD-10-CM | POA: Diagnosis not present

## 2017-01-21 DIAGNOSIS — R21 Rash and other nonspecific skin eruption: Secondary | ICD-10-CM

## 2017-01-21 LAB — CBC WITH DIFFERENTIAL/PLATELET
Basophils Absolute: 0 10*3/uL (ref 0.0–0.1)
Basophils Relative: 0.3 % (ref 0.0–3.0)
EOS PCT: 0.9 % (ref 0.0–5.0)
Eosinophils Absolute: 0.1 10*3/uL (ref 0.0–0.7)
HEMATOCRIT: 37.8 % (ref 36.0–46.0)
HEMOGLOBIN: 12.3 g/dL (ref 12.0–15.0)
LYMPHS ABS: 2 10*3/uL (ref 0.7–4.0)
Lymphocytes Relative: 18.7 % (ref 12.0–46.0)
MCHC: 32.7 g/dL (ref 30.0–36.0)
MCV: 92.9 fl (ref 78.0–100.0)
MONO ABS: 0.8 10*3/uL (ref 0.1–1.0)
MONOS PCT: 7.6 % (ref 3.0–12.0)
NEUTROS ABS: 7.6 10*3/uL (ref 1.4–7.7)
Neutrophils Relative %: 72.5 % (ref 43.0–77.0)
Platelets: 240 10*3/uL (ref 150.0–400.0)
RBC: 4.07 Mil/uL (ref 3.87–5.11)
RDW: 14.5 % (ref 11.5–15.5)
WBC: 10.5 10*3/uL (ref 4.0–10.5)

## 2017-01-21 LAB — COMPREHENSIVE METABOLIC PANEL
ALBUMIN: 4.2 g/dL (ref 3.5–5.2)
ALK PHOS: 47 U/L (ref 39–117)
ALT: 12 U/L (ref 0–35)
AST: 13 U/L (ref 0–37)
BILIRUBIN TOTAL: 0.5 mg/dL (ref 0.2–1.2)
BUN: 9 mg/dL (ref 6–23)
CALCIUM: 9.3 mg/dL (ref 8.4–10.5)
CHLORIDE: 103 meq/L (ref 96–112)
CO2: 26 mEq/L (ref 19–32)
Creatinine, Ser: 0.58 mg/dL (ref 0.40–1.20)
GFR: 125.08 mL/min (ref >60.00)
Glucose, Bld: 102 mg/dL — ABNORMAL HIGH (ref 70–99)
Potassium: 3.8 mEq/L (ref 3.5–5.1)
Sodium: 137 mEq/L (ref 135–145)
Total Protein: 6.9 g/dL (ref 6.0–8.3)

## 2017-01-21 MED ORDER — HYDROXYZINE HCL 10 MG PO TABS: 10.0000 mg | ORAL_TABLET | Freq: Three times a day (TID) | ORAL | 0 refills | Status: DC | PRN

## 2017-01-21 NOTE — Progress Notes (Signed)
Subjective:  Patient ID: Angela Bryant, female    DOB: 1980-10-09  Age: 37 y.o. MRN: [id number]  CC: Rash (red spots on chest area,stomach,swelling on both side base of neck/ going on 1 mo/ went to urgent care--got steroid shot)   Rash  This is a new problem. The current episode started 1 to 4 weeks ago. The problem has been waxing and waning since onset. The affected locations include the face and torso. The rash is characterized by itchiness, redness and burning. She was exposed to nothing. Pertinent negatives include no congestion, cough, eye pain, facial edema, fatigue, fever, joint pain, rhinorrhea, shortness of breath or sore throat. Past treatments include antihistamine and oral steroids. The treatment provided mild relief.   Also developed bilateral neck fullness with rash. Denies any neck pain or dysphagia or dysarthria or change in voice or stiffness.  Outpatient Medications Prior to Visit  Medication Sig Dispense Refill  . ADVAIR DISKUS 250-50 MCG/DOSE AEPB Inhale 1 puff into the lungs at bedtime.     Marland Kitchen albuterol (PROVENTIL HFA;VENTOLIN HFA) 108 (90 Base) MCG/ACT inhaler Inhale 2 puffs into the lungs every 6 (six) hours as needed. Reported on 02/01/2015 1 Inhaler 2  . cyclobenzaprine (FLEXERIL) 10 MG tablet TAKE 1 TABLET BY MOUTH THREE TIMES DAILY AS NEEDED FOR MUSCLE SPASMS 90 tablet 0  . ibuprofen (ADVIL,MOTRIN) 800 MG tablet Take 1 tablet (800 mg total) by mouth every 8 (eight) hours as needed. 90 tablet 1  . levocetirizine (XYZAL) 5 MG tablet Take 1 tablet by mouth at bedtime.    Marland Kitchen levonorgestrel (MIRENA) 20 MCG/24HR IUD 1 each by Intrauterine route once.    . montelukast (SINGULAIR) 10 MG tablet Take 1 tablet by mouth at bedtime.    Marland Kitchen omeprazole (PRILOSEC) 40 MG capsule Take 1 capsule (40 mg total) by mouth daily. 90 capsule 3  . Plecanatide (TRULANCE) 3 MG TABS Take 3 mg by mouth daily. 30 tablet 3  . zolpidem (AMBIEN) 10 MG tablet Take 1 tablet (10 mg total) by mouth at  bedtime. 30 tablet 1  . benzonatate (TESSALON) 100 MG capsule Take 1 capsule (100 mg total) by mouth 3 (three) times daily as needed for cough. (Patient not taking: Reported on 01/21/2017) 21 capsule 0  . fluticasone (FLONASE) 50 MCG/ACT nasal spray Place 2 sprays into both nostrils daily. (Patient not taking: Reported on 01/21/2017) 16 g 6  . LINZESS 145 MCG CAPS capsule TAKE 1 CAPSULE BY MOUTH EVERY MORNING ON EMPTY STOMACH (Patient not taking: Reported on 01/21/2017) 30 capsule 0   No facility-administered medications prior to visit.     ROS See HPI  Objective:  BP 124/86   Pulse 83   Temp 98.3 F (36.8 C)   Ht 5\' 6"  (1.676 m)   Wt 163 lb (73.9 kg)   SpO2 97%   BMI 26.31 kg/m   BP Readings from Last 3 Encounters:  01/21/17 124/86  12/30/16 118/90  12/18/16 130/82    Wt Readings from Last 3 Encounters:  01/21/17 163 lb (73.9 kg)  12/30/16 161 lb 9.6 oz (73.3 kg)  12/18/16 163 lb 9.6 oz (74.2 kg)    Physical Exam  Constitutional: She is oriented to person, place, and time. No distress.  HENT:  Right Ear: Tympanic membrane, external ear and ear canal normal.  Left Ear: Tympanic membrane, external ear and ear canal normal.  Nose: Nose normal.  Mouth/Throat: Uvula is midline. Oral lesions present. No trismus in the jaw. Posterior  oropharyngeal erythema present. No oropharyngeal exudate.    Eyes: Conjunctivae and EOM are normal. Pupils are equal, round, and reactive to light.  Neck: Trachea normal and normal range of motion. Neck supple. No spinous process tenderness and no muscular tenderness present. No neck rigidity. Normal range of motion present. No thyromegaly present.    Cardiovascular: Normal rate and regular rhythm.  Pulmonary/Chest: Effort normal and breath sounds normal.  Abdominal: Soft. Bowel sounds are normal. There is no tenderness.  Musculoskeletal: She exhibits no edema or tenderness.  Lymphadenopathy:    She has cervical adenopathy.  Neurological: She is  alert and oriented to person, place, and time.  Skin: Skin is warm. Rash noted. There is erythema.  Psychiatric: She has a normal mood and affect. Her behavior is normal.  Vitals reviewed.   Lab Results  Component Value Date   WBC 10.5 01/21/2017   HGB 12.3 01/21/2017   HCT 37.8 01/21/2017   PLT 240.0 01/21/2017   GLUCOSE 102 (H) 01/21/2017   ALT 12 01/21/2017   AST 13 01/21/2017   NA 137 01/21/2017   K 3.8 01/21/2017   CL 103 01/21/2017   CREATININE 0.58 01/21/2017   BUN 9 01/21/2017   CO2 26 01/21/2017   TSH 0.85 12/23/2012    Dg Chest 2 View  Result Date: 12/12/2016 CLINICAL DATA:  Cough.  Shortness of breath.  Wheezing. EXAM: CHEST  2 VIEW COMPARISON:  03/15/2008 . FINDINGS: Mediastinum and hilar structures normal. Mild basilar subsegmental atelectasis noted on lateral view only. Heart size normal. No pleural effusion or pneumothorax. Thoracic spine scoliosis. IMPRESSION: Mild basilar subsegmental atelectasis. No acute cardiopulmonary disease. Electronically Signed   By: Maisie Fushomas  Register   On: 12/12/2016 14:09    Assessment & Plan:   Judeth CornfieldStephanie was seen today for rash.  Diagnoses and all orders for this visit:  Rash -     Upper Respiratory Culture -     CBC w/Diff -     Comprehensive metabolic panel -     hydrOXYzine (ATARAX/VISTARIL) 10 MG tablet; Take 1 tablet (10 mg total) by mouth every 8 (eight) hours as needed for itching.  Acute pharyngitis, unspecified etiology -     Upper Respiratory Culture  Fullness of neck -     US Soft Tissue Head/Neck; Future   I am having Chalice F. Clauss start on hydrOXYzine. I am also having her maintain her ADVAIR DISKUS, montelukast, levocetirizine, omeprazole, fluticasone, levonorgestrel, LINZESS, ibuprofen, albuterol, benzonatate, cyclobenzaprine, Plecanatide, and zolpidem.  Meds ordered this encounter  Medications  . hydrOXYzine (ATARAX/VISTARIL) 10 MG tablet    Sig: Take 1 tablet (10 mg total) by mouth every 8 (eight)  hours as needed for itching.    Dispense:  30 tablet    Refill:  0    Order Specific Question:   Supervising Provider    Answer:   Dianne DunARON, TALIA M [[id number]]    Follow-up: Return if symptoms worsen or fail to improve.  Alysia Pennaharlotte Lakshmi Sundeen, NP

## 2017-01-21 NOTE — Patient Instructions (Addendum)
Normal lab results. Proceed with US neck. Throat culture pending. Need to f/up with dermatology about rash  You will be contacted to schedule appt for neck US.

## 2017-01-23 ENCOUNTER — Ambulatory Visit
Admission: RE | Admit: 2017-01-23 | Discharge: 2017-01-23 | Disposition: A | Discharge status: Home / Self Care | Payer: 59 | Source: Ambulatory Visit | Attending: Nurse Practitioner | Admitting: Nurse Practitioner

## 2017-01-23 ENCOUNTER — Other Ambulatory Visit: Payer: Self-pay | Admitting: Nurse Practitioner

## 2017-01-23 ENCOUNTER — Encounter: Payer: Self-pay | Admitting: Nurse Practitioner

## 2017-01-23 DIAGNOSIS — R221 Localized swelling, mass and lump, neck: Secondary | ICD-10-CM | POA: Diagnosis not present

## 2017-01-23 DIAGNOSIS — R21 Rash and other nonspecific skin eruption: Secondary | ICD-10-CM

## 2017-01-23 MED ORDER — PREDNISONE 10 MG (21) PO TBPK: ORAL_TABLET | ORAL | 0 refills | Status: DC

## 2017-01-23 NOTE — Telephone Encounter (Signed)
Patient would like a referral to a Dermatologist that can get her in ASAP.

## 2017-01-24 ENCOUNTER — Encounter: Payer: Self-pay | Admitting: Nurse Practitioner

## 2017-01-24 LAB — CULTURE, UPPER RESPIRATORY
MICRO NUMBER: 90028610
SPECIMEN QUALITY:: ADEQUATE

## 2017-01-29 DIAGNOSIS — L309 Dermatitis, unspecified: Secondary | ICD-10-CM | POA: Diagnosis not present

## 2017-02-01 ENCOUNTER — Encounter: Payer: Self-pay | Admitting: Family Medicine

## 2017-02-01 ENCOUNTER — Ambulatory Visit: Payer: 59 | Admitting: Family Medicine

## 2017-02-01 VITALS — BP 118/76 | HR 105 | Temp 97.9°F | Ht 66.0 in | Wt 162.1 lb

## 2017-02-01 DIAGNOSIS — R69 Illness, unspecified: Secondary | ICD-10-CM

## 2017-02-01 DIAGNOSIS — B349 Viral infection, unspecified: Secondary | ICD-10-CM | POA: Diagnosis not present

## 2017-02-01 DIAGNOSIS — R52 Pain, unspecified: Secondary | ICD-10-CM

## 2017-02-01 DIAGNOSIS — J111 Influenza due to unidentified influenza virus with other respiratory manifestations: Secondary | ICD-10-CM

## 2017-02-01 DIAGNOSIS — J029 Acute pharyngitis, unspecified: Secondary | ICD-10-CM | POA: Diagnosis not present

## 2017-02-01 LAB — POC INFLUENZA A&B (BINAX/QUICKVUE)
Influenza A, POC: NEGATIVE
Influenza B, POC: NEGATIVE

## 2017-02-01 LAB — POCT RAPID STREP A (OFFICE): RAPID STREP A SCREEN: NEGATIVE

## 2017-02-01 MED ORDER — PROMETHAZINE HCL 12.5 MG PO TABS: ORAL_TABLET | ORAL | 0 refills | Status: DC

## 2017-02-01 MED ORDER — OSELTAMIVIR PHOSPHATE 75 MG PO CAPS: 75.0000 mg | ORAL_CAPSULE | Freq: Two times a day (BID) | ORAL | 0 refills | Status: DC

## 2017-02-01 MED ORDER — NYSTATIN 100000 UNIT/ML MT SUSP: 5.0000 mL | Freq: Four times a day (QID) | OROMUCOSAL | 0 refills | Status: DC

## 2017-02-01 MED ORDER — TRAMADOL HCL 50 MG PO TABS: ORAL_TABLET | ORAL | 0 refills | Status: DC

## 2017-02-01 NOTE — Progress Notes (Signed)
OFFICE VISIT  02/02/2017   CC:  Chief Complaint  Patient presents with  . Generalized Body Aches   HPI:    Patient is a 37 y.o. female with a history of allergic rhinitis and moderate persistent asthma who presents for body aches. Onset yesterday : abrupt onset bad body aches--head to toe.   Ibuprofen helped a little.  Started getting rigors/fever, temp up to 102. Ate pizza last night--vomited x 4 since.  Mild nausea coming and going since then.  Ate crackers this morning and kept them down. Had 3-4 watery BMs yesterday.  No abd cramping.  Tiny bit of cough--deep/dry.  Runny nose started last night. Throat a bit sore last couple days.  Constant HA x 3d.  NO CP, SOB, chest tightness, or wheezing. No rash. No flu vaccine this season. No known sick contacts.  Past Medical History:  Diagnosis Date  . Anxiety   . Asthma   . GERD (gastroesophageal reflux disease)   . Insomnia   . Irritable bowel syndrome     Past Surgical History:  Procedure Laterality Date  . ANAL RECTAL MANOMETRY N/A 01/04/2015   Procedure: ANO RECTAL MANOMETRY;  Surgeon: Napoleon FormKavitha Nandigam V, MD;  Location: [facility];  Service: Endoscopy;  Laterality: N/A;    Outpatient Medications Prior to Visit  Medication Sig Dispense Refill  . ADVAIR DISKUS 250-50 MCG/DOSE AEPB Inhale 1 puff into the lungs at bedtime.     Marland Kitchen. albuterol (PROVENTIL HFA;VENTOLIN HFA) 108 (90 Base) MCG/ACT inhaler Inhale 2 puffs into the lungs every 6 (six) hours as needed. Reported on 02/01/2015 1 Inhaler 2  . cyclobenzaprine (FLEXERIL) 10 MG tablet TAKE 1 TABLET BY MOUTH THREE TIMES DAILY AS NEEDED FOR MUSCLE SPASMS 90 tablet 0  . fluticasone (FLONASE) 50 MCG/ACT nasal spray Place 2 sprays into both nostrils daily. 16 g 6  . hydrOXYzine (ATARAX/VISTARIL) 10 MG tablet Take 1 tablet (10 mg total) by mouth every 8 (eight) hours as needed for itching. 30 tablet 0  . ibuprofen (ADVIL,MOTRIN) 800 MG tablet Take 1 tablet (800 mg total) by mouth every  8 (eight) hours as needed. 90 tablet 1  . levocetirizine (XYZAL) 5 MG tablet Take 1 tablet by mouth at bedtime.    Marland Kitchen. levonorgestrel (MIRENA) 20 MCG/24HR IUD 1 each by Intrauterine route once.    Marland Kitchen. LINZESS 145 MCG CAPS capsule TAKE 1 CAPSULE BY MOUTH EVERY MORNING ON EMPTY STOMACH 30 capsule 0  . montelukast (SINGULAIR) 10 MG tablet Take 1 tablet by mouth at bedtime.    Marland Kitchen. omeprazole (PRILOSEC) 40 MG capsule Take 1 capsule (40 mg total) by mouth daily. 90 capsule 3  . Plecanatide (TRULANCE) 3 MG TABS Take 3 mg by mouth daily. 30 tablet 3  . predniSONE (STERAPRED UNI-PAK 21 TAB) 10 MG (21) TBPK tablet As directed on package 21 tablet 0  . zolpidem (AMBIEN) 10 MG tablet Take 1 tablet (10 mg total) by mouth at bedtime. 30 tablet 1  . benzonatate (TESSALON) 100 MG capsule Take 1 capsule (100 mg total) by mouth 3 (three) times daily as needed for cough. (Patient not taking: Reported on 02/01/2017) 21 capsule 0   No facility-administered medications prior to visit.     Allergies  Allergen Reactions  . Codeine Nausea And Vomiting    ROS As per HPI  PE: Blood pressure 118/76, pulse (!) 105, temperature 97.9 F (36.6 C), temperature source Oral, height 5\' 6"  (1.676 m), weight 162 lb 1.9 oz (73.5 kg), SpO2 98 %.  VS: noted--normal except T 105. Gen: alert, NAD, NONTOXIC APPEARING. HEENT: eyes without injection, drainage, or swelling.  Ears: EACs clear, TMs with normal light reflex and landmarks.  Nose: Clear rhinorrhea, with some dried, crusty exudate adherent to mildly injected mucosa.  No purulent d/c.  No paranasal sinus TTP.  No facial swelling.  Throat and mouth without focal lesion--although she has diffuse whitish-grey plaque partially adherent to tongue.  She has soft palate/uvula/tonsillar region erythema and mild exudate.  No swelling, no asymmetry.  No significant ant cerv LAD or cervical tenderness. Neck: supple, no LAD.   LUNGS: CTA bilat, nonlabored resps.   CV: RRR, no m/r/g. EXT: no  c/c/e SKIN: no rash   LABS:    Chemistry      Component Value Date/Time   NA 137 01/21/2017 1451   K 3.8 01/21/2017 1451   CL 103 01/21/2017 1451   CO2 26 01/21/2017 1451   BUN 9 01/21/2017 1451   CREATININE 0.58 01/21/2017 1451      Component Value Date/Time   CALCIUM 9.3 01/21/2017 1451   ALKPHOS 47 01/21/2017 1451   AST 13 01/21/2017 1451   ALT 12 01/21/2017 1451   BILITOT 0.5 01/21/2017 1451     Rapid Flu A/B: NEG today. 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 Will empirically treat with tamiflu 75mg  bid x 5d. Symptomatic care with phenergan 12.5mg , 1-2 tabs q6h prn, #20 no RF. Also tramadol 50mg , 1-2 tid prn pain, #20, no RF.  She has thrush: treat with nystatin susp, 1 tsp swish and spit qid x 14d. Discussed strict adherence to rinsing mouth out after advair use.  An After Visit Summary was printed and given to the patient.  FOLLOW UP: Return if symptoms worsen or fail to improve.  Signed:  Santiago Bumpers, MD           02/02/2017

## 2017-02-04 ENCOUNTER — Telehealth: Payer: Self-pay | Admitting: Family Medicine

## 2017-02-04 NOTE — Telephone Encounter (Signed)
Please advise. Thanks.  

## 2017-02-04 NOTE — Telephone Encounter (Signed)
Copied from CRM [id number]. Topic: Quick Communication - Rx Refill/Question >> Feb 04, 2017  1:20 PM Crist InfanteHarrald, Kathy J wrote: Medication: nystatin (MYCOSTATIN) 100000 UNIT/ML suspension  Pt seen Saturday 1/18 by Dr Milinda CaveMcGowen and was prescribed this med. [facility] only had a partial amount (a small amount) of this Rx available, and now they told pt it will not be in until 2/8.  Pt still has the sores on her tongue and pt thinks maybe she did not get rid of with the partial Rx. [facility] did tell her [facility] does have it in stock. Wants to know if you can send in another Rx for this, or should she use something else that might work better?  [facility]

## 2017-02-05 ENCOUNTER — Other Ambulatory Visit: Payer: Self-pay | Admitting: Family Medicine

## 2017-02-05 MED ORDER — FLUCONAZOLE 150 MG PO TABS
ORAL_TABLET | ORAL | 0 refills | Status: DC
Start: 2017-02-05 — End: 2017-09-08

## 2017-02-05 NOTE — Progress Notes (Signed)
[facility] [address], [address] [place], [place] [address] [phone] [phone]  Date:  02/06/2017   Name:  Angela PoundStephanie F Bryant   DOB:  06/27/1980   MRN:  [id number]  PCP:  Donato SchultzLowne Chase, Yvonne R, DO    Chief Complaint: Rash (c/o rash that come and goes that only appears in certain areas. Sx's present since begining of November. )   History of Present Illness:  Angela PoundStephanie F Bryant is a 37 y.o. very pleasant female patient who presents with the following:  Pt of Dr. Laury AxonLowne here today with concern of a rash- pt reports that she has been to multiple doctors and also dermatology but continues to have the problem so she decided to come in today. She notes a macule on her right side that will turn red it she scratches it.  She also feels that her neck, face and eyelids are itchy  She was seen on 1/8 for this by NP Nche  She notes that she has been itching since November, since she returned from a cruise  She has been "to the doctor a million times and I've been to the dermatologist, nothing has fixed it." Asked her what dermatology has recommended- she reports that she has an appt for allergy scratch testing this coming Monday.   A friend of hers has been itching as well, but they have not spent time together  She notes that this happened once before about 12 years ago, "and they finally fixed it, but it took them months to figure it out then too."  She does not remember what it was determined that she had, or what medication seemed to finally resolve her symptoms   When she took prednisone for 6 days and this did seem to help. However it may have caused her oral thrush- she was also seen on 1/19 with acute flu like illness and was noted to have white material on her tongue Her flu like sx are now resolved  She recently had an negative CBC and CMP  Patient Active Problem List   Diagnosis Date Noted  . Chronic idiopathic constipation 06/10/2013   . ALLERGIC RHINITIS 04/14/2006  . ASTHMA 04/14/2006  . GERD 04/14/2006    Past Medical History:  Diagnosis Date  . Anxiety   . Asthma   . GERD (gastroesophageal reflux disease)   . Insomnia   . Irritable bowel syndrome     Past Surgical History:  Procedure Laterality Date  . ANAL RECTAL MANOMETRY N/A 01/04/2015   Procedure: ANO RECTAL MANOMETRY;  Surgeon: Napoleon FormKavitha Nandigam V, MD;  Location: [facility];  Service: Endoscopy;  Laterality: N/A;    Social History   Tobacco Use  . Smoking status: Light Tobacco Smoker    Types: Cigarettes  . Smokeless tobacco: Never Used  . Tobacco comment: rarely has a cigarrette, sometimes on weekends  Substance Use Topics  . Alcohol use: Yes    Alcohol/week: 0.0 oz    Comment: occasional   . Drug use: No    Family History  Problem Relation Age of Onset  . Hypertension Father   . Asthma Mother     Allergies  Allergen Reactions  . Codeine Nausea And Vomiting    Medication list has been reviewed and updated.  Current Outpatient Medications on File Prior to Visit  Medication Sig Dispense Refill  . ADVAIR DISKUS 250-50 MCG/DOSE AEPB Inhale 1 puff into the lungs at bedtime.     .Marland Kitchen  albuterol (PROVENTIL HFA;VENTOLIN HFA) 108 (90 Base) MCG/ACT inhaler Inhale 2 puffs into the lungs every 6 (six) hours as needed. Reported on 02/01/2015 1 Inhaler 2  . cyclobenzaprine (FLEXERIL) 10 MG tablet TAKE 1 TABLET BY MOUTH THREE TIMES DAILY AS NEEDED FOR MUSCLE SPASMS 90 tablet 0  . fluconazole (DIFLUCAN) 150 MG tablet 1 tab po qd x 3d 3 tablet 0  . fluticasone (FLONASE) 50 MCG/ACT nasal spray Place 2 sprays into both nostrils daily. 16 g 6  . hydrOXYzine (ATARAX/VISTARIL) 10 MG tablet Take 1 tablet (10 mg total) by mouth every 8 (eight) hours as needed for itching. 30 tablet 0  . ibuprofen (ADVIL,MOTRIN) 800 MG tablet Take 1 tablet (800 mg total) by mouth every 8 (eight) hours as needed. 90 tablet 1  . levocetirizine (XYZAL) 5 MG tablet Take 1 tablet  by mouth at bedtime.    Marland Kitchen levonorgestrel (MIRENA) 20 MCG/24HR IUD 1 each by Intrauterine route once.    Marland Kitchen LINZESS 145 MCG CAPS capsule TAKE 1 CAPSULE BY MOUTH EVERY MORNING ON EMPTY STOMACH 30 capsule 0  . montelukast (SINGULAIR) 10 MG tablet Take 1 tablet by mouth at bedtime.    Marland Kitchen nystatin (MYCOSTATIN) 100000 UNIT/ML suspension Take 5 mLs (500,000 Units total) by mouth 4 (four) times daily. 60 mL 0  . omeprazole (PRILOSEC) 40 MG capsule Take 1 capsule (40 mg total) by mouth daily. 90 capsule 3  . Plecanatide (TRULANCE) 3 MG TABS Take 3 mg by mouth daily. 30 tablet 3  . zolpidem (AMBIEN) 10 MG tablet Take 1 tablet (10 mg total) by mouth at bedtime. 30 tablet 1   No current facility-administered medications on file prior to visit.     Review of Systems:  As per HPI- otherwise negative. No fever or chills   Physical Examination: Vitals:   02/06/17 1745  BP: 122/88  Pulse: 96  Temp: 99.1 F (37.3 C)  SpO2: 99%   Vitals:   02/06/17 1745  Weight: 164 lb 12.8 oz (74.8 kg)  Height: 5\' 6"  (1.676 m)   Body mass index is 26.6 kg/m. Ideal Body Weight: Weight in (lb) to have BMI = 25: 154.6  GEN: WDWN, NAD, Non-toxic, A & O x 3, overweight, otherwise looks well  HEENT: Atraumatic, Normocephalic. Neck supple. No masses, No LAD. Bilateral TM wnl, oropharynx normal.  PEERL,EOMI.   Ears and Nose: No external deformity. CV: RRR, No M/G/R. No JVD. No thrill. No extra heart sounds. PULM: CTA B, no wheezes, crackles, rhonchi. No retractions. No resp. distress. No accessory muscle use. EXTR: No c/c/e NEURO Normal gait.  PSYCH: Normally interactive. Conversant. Not depressed or anxious appearing.  Calm demeanor.  She has a faintly visible scaly macule on her right side.  The skin on her chest and neck appears slightly irritated but not abnormal.  No abnormal findings noted on her face or anywhere else on her skin   Assessment and Plan: Rash  Itching  Here today with itching and possible  rash for about 2 months. She has an appt for skin testing with derm on Monday.  Counseled her that I think this is an appropriate next step, and would encourage her to keep this appt.  Will not use steroids again as she does have this testing scheduled in a few days.  She will seek care if any acute worsening in the meantime   Signed Abbe Amsterdam, MD

## 2017-02-05 NOTE — Telephone Encounter (Signed)
Pt advised and voiced understanding.   

## 2017-02-05 NOTE — Telephone Encounter (Signed)
I'll send in diflucan tabs for her.

## 2017-02-06 ENCOUNTER — Encounter: Payer: Self-pay | Admitting: Family Medicine

## 2017-02-06 ENCOUNTER — Ambulatory Visit: Payer: 59 | Admitting: Family Medicine

## 2017-02-06 VITALS — BP 122/88 | HR 90 | Temp 98.2°F | Ht 66.0 in | Wt 164.8 lb

## 2017-02-06 DIAGNOSIS — R21 Rash and other nonspecific skin eruption: Secondary | ICD-10-CM | POA: Diagnosis not present

## 2017-02-06 DIAGNOSIS — L299 Pruritus, unspecified: Secondary | ICD-10-CM

## 2017-02-10 DIAGNOSIS — L2084 Intrinsic (allergic) eczema: Secondary | ICD-10-CM | POA: Diagnosis not present

## 2017-02-10 DIAGNOSIS — R21 Rash and other nonspecific skin eruption: Secondary | ICD-10-CM | POA: Diagnosis not present

## 2017-02-12 ENCOUNTER — Telehealth: Payer: Self-pay

## 2017-02-12 NOTE — Telephone Encounter (Signed)
Follow up call from [facility] call made by patient on 02/10/16. Patient states she went to see Dermatologist and was prescribed Prednisone, Cortisone and a cream. States she does fine as long as she is on prednisone but as soon as she comes off her rash comes right back. Not sure what she needs to do to have rash cleared all the way.

## 2017-02-13 NOTE — Telephone Encounter (Signed)
She probably needs to f/u with derm and let them know the rash is no better--- comes right back

## 2017-02-14 ENCOUNTER — Telehealth: Payer: Self-pay | Admitting: Family Medicine

## 2017-02-14 DIAGNOSIS — R6889 Other general symptoms and signs: Secondary | ICD-10-CM | POA: Diagnosis not present

## 2017-02-14 DIAGNOSIS — K219 Gastro-esophageal reflux disease without esophagitis: Secondary | ICD-10-CM | POA: Diagnosis not present

## 2017-02-14 NOTE — Telephone Encounter (Signed)
Copied from CRM [id number]. Topic: Quick Communication - Rx Refill/Question >> Feb 14, 2017 11:00 AM Oneal GroutSebastian, Jennifer S wrote: Medication:  zolpidem (AMBIEN) 10 MG tablet and  ibuprofen (ADVIL,MOTRIN) 800 MG tablet    Has the patient contacted their pharmacy? Yes.     (Agent: If no, request that the patient contact the pharmacy for the refill.)   Preferred Pharmacy (with phone number or street name): [facility] [address], at pharmacy now, needs refill asap.    Agent: Please be advised that RX refills may take up to 3 business days. We ask that you follow-up with your pharmacy.

## 2017-03-11 ENCOUNTER — Encounter: Payer: Self-pay | Admitting: Gastroenterology

## 2017-03-11 ENCOUNTER — Other Ambulatory Visit: Payer: Self-pay | Admitting: Family Medicine

## 2017-03-11 DIAGNOSIS — G4700 Insomnia, unspecified: Secondary | ICD-10-CM

## 2017-03-11 MED ORDER — ZOLPIDEM TARTRATE 10 MG PO TABS: 10.0000 mg | ORAL_TABLET | Freq: Every day | ORAL | 1 refills | Status: DC

## 2017-03-11 NOTE — Telephone Encounter (Signed)
Patient notified

## 2017-03-11 NOTE — Telephone Encounter (Signed)
filled

## 2017-03-11 NOTE — Telephone Encounter (Signed)
Pt still needing Ambien 10mg .She is going out of town on Thursday.

## 2017-03-11 NOTE — Telephone Encounter (Signed)
Patient requesting refill on ambien  Database ran and is on your desk for review.  Last filled per database: 02/14/17 Last written: 01/09/17 Last ov: 01/21/17 Next ov: none Contract: 09/30/17 UDS: 09/27/17

## 2017-04-07 ENCOUNTER — Other Ambulatory Visit (HOSPITAL_COMMUNITY)
Admission: RE | Admit: 2017-04-07 | Discharge: 2017-04-07 | Disposition: A | Discharge status: Home / Self Care | Payer: 59 | Source: Ambulatory Visit | Attending: Obstetrics and Gynecology | Admitting: Obstetrics and Gynecology

## 2017-04-07 ENCOUNTER — Other Ambulatory Visit: Payer: Self-pay | Admitting: Obstetrics and Gynecology

## 2017-04-07 DIAGNOSIS — Z124 Encounter for screening for malignant neoplasm of cervix: Secondary | ICD-10-CM | POA: Insufficient documentation

## 2017-04-07 DIAGNOSIS — N83201 Unspecified ovarian cyst, right side: Secondary | ICD-10-CM | POA: Diagnosis not present

## 2017-04-08 LAB — CYTOLOGY - PAP
Diagnosis: NEGATIVE
HPV (WINDOPATH): NOT DETECTED

## 2017-04-21 DIAGNOSIS — L233 Allergic contact dermatitis due to drugs in contact with skin: Secondary | ICD-10-CM | POA: Diagnosis not present

## 2017-04-24 DIAGNOSIS — J069 Acute upper respiratory infection, unspecified: Secondary | ICD-10-CM | POA: Diagnosis not present

## 2017-04-24 DIAGNOSIS — E041 Nontoxic single thyroid nodule: Secondary | ICD-10-CM | POA: Diagnosis not present

## 2017-04-25 ENCOUNTER — Ambulatory Visit (HOSPITAL_COMMUNITY)
Admission: RE | Admit: 2017-04-25 | Discharge: 2017-04-25 | Disposition: A | Discharge status: Home / Self Care | Payer: 59 | Source: Ambulatory Visit | Attending: Emergency Medicine | Admitting: Emergency Medicine

## 2017-04-25 ENCOUNTER — Other Ambulatory Visit (HOSPITAL_COMMUNITY): Payer: Self-pay | Admitting: Emergency Medicine

## 2017-04-25 DIAGNOSIS — E041 Nontoxic single thyroid nodule: Secondary | ICD-10-CM | POA: Diagnosis not present

## 2017-04-25 DIAGNOSIS — L239 Allergic contact dermatitis, unspecified cause: Secondary | ICD-10-CM | POA: Diagnosis not present

## 2017-04-25 DIAGNOSIS — E042 Nontoxic multinodular goiter: Secondary | ICD-10-CM | POA: Diagnosis not present

## 2017-05-05 ENCOUNTER — Ambulatory Visit: Payer: 59 | Admitting: Family Medicine

## 2017-05-05 DIAGNOSIS — Z0289 Encounter for other administrative examinations: Secondary | ICD-10-CM

## 2017-05-06 ENCOUNTER — Other Ambulatory Visit: Payer: Self-pay | Admitting: Family Medicine

## 2017-05-06 DIAGNOSIS — G4700 Insomnia, unspecified: Secondary | ICD-10-CM

## 2017-05-08 NOTE — Telephone Encounter (Signed)
Relation to pt: self   Call back number:[phone] Pharmacy: [id number] - [place], [place] - [address] [facility] [address] [phone]) [phone] (Fax)     Reason for call: Patient checking on the status of zolpidem (AMBIEN) 10 MG tablet refill, patient states its due Saturday, patient contacted pharmacy request sent in on 05/06/17, informed patient please allow 48 to 72 hour turn around time, please advise

## 2017-05-08 NOTE — Telephone Encounter (Signed)
Requesting: AMBIEN  10 MG Contract: 09/30/16 UDS: 09/30/16 Low risk Last OV: 02/06/17 Next OV:  - Last Refill:  03/11/17   #30  Please advise

## 2017-05-19 DIAGNOSIS — J4530 Mild persistent asthma, uncomplicated: Secondary | ICD-10-CM | POA: Diagnosis not present

## 2017-05-19 DIAGNOSIS — J301 Allergic rhinitis due to pollen: Secondary | ICD-10-CM | POA: Diagnosis not present

## 2017-05-19 DIAGNOSIS — J3081 Allergic rhinitis due to animal (cat) (dog) hair and dander: Secondary | ICD-10-CM | POA: Diagnosis not present

## 2017-05-19 DIAGNOSIS — J3089 Other allergic rhinitis: Secondary | ICD-10-CM | POA: Diagnosis not present

## 2017-05-22 ENCOUNTER — Ambulatory Visit: Payer: 59 | Admitting: Family Medicine

## 2017-05-28 ENCOUNTER — Telehealth: Payer: Self-pay | Admitting: *Deleted

## 2017-05-28 DIAGNOSIS — L235 Allergic contact dermatitis due to other chemical products: Secondary | ICD-10-CM | POA: Diagnosis not present

## 2017-05-28 NOTE — Telephone Encounter (Signed)
Copied from CRM [id number]. Topic: Bill or Statement - Patient/Guarantor Inquiry >> May 28, 2017 10:27 AM Lonia Mad wrote: Patient name/MRN/Acct #: Angela Bryant, MRN: [id number] DOS: 05/05/17 Details of issue or inquiry: Pt states she canceled her appt on 05/05/17 @ 8:04 for her appt on 05/05/17 @ 9:45. Please f/u with pt if the no show/cancel within 24hr is correct or incorrect.

## 2017-05-30 DIAGNOSIS — J301 Allergic rhinitis due to pollen: Secondary | ICD-10-CM | POA: Diagnosis not present

## 2017-05-30 DIAGNOSIS — J3081 Allergic rhinitis due to animal (cat) (dog) hair and dander: Secondary | ICD-10-CM | POA: Diagnosis not present

## 2017-05-30 DIAGNOSIS — J3089 Other allergic rhinitis: Secondary | ICD-10-CM | POA: Diagnosis not present

## 2017-05-30 DIAGNOSIS — J4530 Mild persistent asthma, uncomplicated: Secondary | ICD-10-CM | POA: Diagnosis not present

## 2017-06-06 ENCOUNTER — Other Ambulatory Visit: Payer: Self-pay | Admitting: Family Medicine

## 2017-06-06 DIAGNOSIS — G4700 Insomnia, unspecified: Secondary | ICD-10-CM

## 2017-06-06 NOTE — Telephone Encounter (Signed)
Requesting:AMBIEN 10 MG Contract:09/30/16 UDS:09/30/16 Low risk Last OV:02/06/17 Next OV:- Last Refill: 05/08/17   Please advise

## 2017-06-20 DIAGNOSIS — R21 Rash and other nonspecific skin eruption: Secondary | ICD-10-CM | POA: Diagnosis not present

## 2017-06-26 ENCOUNTER — Telehealth: Payer: Self-pay | Admitting: *Deleted

## 2017-06-26 ENCOUNTER — Ambulatory Visit: Payer: 59 | Admitting: Medical

## 2017-06-26 ENCOUNTER — Encounter: Payer: Self-pay | Admitting: Medical

## 2017-06-26 VITALS — BP 121/79 | HR 91 | Temp 98.3°F | Resp 16 | Ht 66.0 in | Wt 158.2 lb

## 2017-06-26 DIAGNOSIS — R197 Diarrhea, unspecified: Secondary | ICD-10-CM

## 2017-06-26 DIAGNOSIS — R1013 Epigastric pain: Secondary | ICD-10-CM | POA: Diagnosis not present

## 2017-06-26 LAB — CBC WITH DIFFERENTIAL/PLATELET
Basophils Absolute: 0.1 10*3/uL (ref 0.0–0.1)
Basophils Relative: 0.9 % (ref 0.0–3.0)
Eosinophils Absolute: 0 10*3/uL (ref 0.0–0.7)
Eosinophils Relative: 0.6 % (ref 0.0–5.0)
HEMATOCRIT: 38.2 % (ref 36.0–46.0)
HEMOGLOBIN: 13 g/dL (ref 12.0–15.0)
LYMPHS PCT: 11.3 % — AB (ref 12.0–46.0)
Lymphs Abs: 0.7 10*3/uL (ref 0.7–4.0)
MCHC: 34.1 g/dL (ref 30.0–36.0)
MCV: 89.2 fl (ref 78.0–100.0)
MONOS PCT: 8.3 % (ref 3.0–12.0)
Monocytes Absolute: 0.5 10*3/uL (ref 0.1–1.0)
NEUTROS ABS: 5.2 10*3/uL (ref 1.4–7.7)
Neutrophils Relative %: 78.9 % — ABNORMAL HIGH (ref 43.0–77.0)
Platelets: 175 10*3/uL (ref 150.0–400.0)
RBC: 4.29 Mil/uL (ref 3.87–5.11)
RDW: 13.6 % (ref 11.5–15.5)
WBC: 6.5 10*3/uL (ref 4.0–10.5)

## 2017-06-26 LAB — COMPREHENSIVE METABOLIC PANEL
ALBUMIN: 4.1 g/dL (ref 3.5–5.2)
ALK PHOS: 49 U/L (ref 39–117)
ALT: 17 U/L (ref 0–35)
AST: 15 U/L (ref 0–37)
BILIRUBIN TOTAL: 0.4 mg/dL (ref 0.2–1.2)
BUN: 8 mg/dL (ref 6–23)
CALCIUM: 9.1 mg/dL (ref 8.4–10.5)
CO2: 28 mEq/L (ref 19–32)
CREATININE: 0.74 mg/dL (ref 0.40–1.20)
Chloride: 103 mEq/L (ref 96–112)
GFR: 94.2 mL/min (ref >60.00)
Glucose, Bld: 87 mg/dL (ref 70–99)
Potassium: 4.2 mEq/L (ref 3.5–5.1)
Sodium: 136 mEq/L (ref 135–145)
TOTAL PROTEIN: 6.8 g/dL (ref 6.0–8.3)

## 2017-06-26 MED ORDER — ONDANSETRON 8 MG PO TBDP: 8.0000 mg | ORAL_TABLET | Freq: Three times a day (TID) | ORAL | 0 refills | Status: DC | PRN

## 2017-06-26 NOTE — Telephone Encounter (Signed)
Copied from CRM [id number]. Topic: Bill or Statement - Patient/Guarantor Inquiry >> May 28, 2017 10:27 AM Lonia Madlack, Jessica D wrote: Patient name/MRN/Acct #: Angela Bryant, MRN: [id number] DOS: 05/05/17 Details of issue or inquiry: Pt states she canceled her appt on 05/05/17 @ 8:04 for her appt on 05/05/17 @ 9:45. Please f/u with pt if the no show/cancel within 24hr is correct or incorrect.    >> Jun 11, 2017 10:07 AM Oneal GroutSebastian, Jennifer S wrote: Patient checking status >> Jun 26, 2017  8:39 AM Maia Pettiesrtiz, Kristie S wrote: Pt is calling again to see if the no show fee will be waived. She is requesting a call back please. CAll back # [phone].

## 2017-06-26 NOTE — Telephone Encounter (Signed)
emailed charge correction for no-show fee to be removed

## 2017-06-26 NOTE — Progress Notes (Signed)
 Subjective:    Patient ID: Angela Bryant, female    DOB: 03/27/1980, 37 y.o.   MRN: [id number]  HPI  Pt states 2 days ago she had acute onset of abdomen pain and had to go to bathroom almost non stop. Yesterday felt mild achy but  had no loose stools. Then today she had recurrent loose stools. Stomach very upset and feels like in knots.  First day she had loose stools 10-15 times.   Today she had one loose stool.  No recent antibiotic before loose stools. Pt does not report any suspicious foods prior to onset.  She states feels fatigue.  First day she felt nausea but no vomiting.   Husband was sick recently with a lot of diarrhea.  That was after memorial day.  Pt has mirena.   Review of Systems  Constitutional: Negative for chills, fatigue and fever.  Respiratory: Negative for cough, chest tightness, shortness of breath and wheezing.   Cardiovascular: Negative for chest pain and palpitations.  Gastrointestinal: Positive for abdominal pain and diarrhea. Negative for abdominal distention, anal bleeding, blood in stool, constipation and nausea.  Musculoskeletal: Positive for myalgias. Negative for back pain and neck stiffness.       Back muscle aches.  Neurological: Negative for dizziness, weakness and light-headedness.  Hematological: Negative for adenopathy. Does not bruise/bleed easily.  Psychiatric/Behavioral: Negative for behavioral problems, confusion, hallucinations and sleep disturbance. The patient is not nervous/anxious.     Past Medical History:  Diagnosis Date  . Anxiety   . Asthma   . GERD (gastroesophageal reflux disease)   . Insomnia   . Irritable bowel syndrome      Social History   Socioeconomic History  . Marital status: Married    Spouse name: Not on file  . Number of children: 0  . Years of education: Not on file  . Highest education level: Not on file  Occupational History  . Occupation: t [employer]    Employer: [employer]  Social Needs  .  Financial resource strain: Not on file  . Food insecurity:    Worry: Not on file    Inability: Not on file  . Transportation needs:    Medical: Not on file    Non-medical: Not on file  Tobacco Use  . Smoking status: Light Tobacco Smoker    Types: Cigarettes  . Smokeless tobacco: Never Used  . Tobacco comment: rarely has a cigarrette, sometimes on weekends  Substance and Sexual Activity  . Alcohol use: Yes    Alcohol/week: 0.0 oz    Comment: occasional   . Drug use: No  . Sexual activity: Yes    Partners: Male  Lifestyle  . Physical activity:    Days per week: Not on file    Minutes per session: Not on file  . Stress: Not on file  Relationships  . Social connections:    Talks on phone: Not on file    Gets together: Not on file    Attends religious service: Not on file    Active member of club or organization: Not on file    Attends meetings of clubs or organizations: Not on file    Relationship status: Not on file  . Intimate partner violence:    Fear of current or ex partner: Not on file    Emotionally abused: Not on file    Physically abused: Not on file    Forced sexual activity: Not on file  Other Topics Concern  .   Not on file  Social History Narrative  . Not on file    Past Surgical History:  Procedure Laterality Date  . ANAL RECTAL MANOMETRY N/A 01/04/2015   Procedure: ANO RECTAL MANOMETRY;  Surgeon: Mauri Pole, MD;  Location: [facility];  Service: Endoscopy;  Laterality: N/A;    Family History  Problem Relation Age of Onset  . Hypertension Father   . Asthma Mother     Allergies  Allergen Reactions  . Codeine Nausea And Vomiting    Current Outpatient Medications on File Prior to Visit  Medication Sig Dispense Refill  . ADVAIR DISKUS 250-50 MCG/DOSE AEPB Inhale 1 puff into the lungs at bedtime.     Marland Kitchen albuterol (PROVENTIL HFA;VENTOLIN HFA) 108 (90 Base) MCG/ACT inhaler Inhale 2 puffs into the lungs every 6 (six) hours as needed. Reported on  02/01/2015 1 Inhaler 2  . cyclobenzaprine (FLEXERIL) 10 MG tablet TAKE 1 TABLET BY MOUTH THREE TIMES DAILY AS NEEDED FOR MUSCLE SPASMS 90 tablet 0  . fluconazole (DIFLUCAN) 150 MG tablet 1 tab po qd x 3d 3 tablet 0  . fluticasone (FLONASE) 50 MCG/ACT nasal spray Place 2 sprays into both nostrils daily. 16 g 6  . hydrOXYzine (ATARAX/VISTARIL) 10 MG tablet Take 1 tablet (10 mg total) by mouth every 8 (eight) hours as needed for itching. 30 tablet 0  . ibuprofen (ADVIL,MOTRIN) 800 MG tablet TAKE 1 TABLET(800 MG) BY MOUTH EVERY 8 HOURS AS NEEDED 90 tablet 0  . levocetirizine (XYZAL) 5 MG tablet Take 1 tablet by mouth at bedtime.    Marland Kitchen levonorgestrel (MIRENA) 20 MCG/24HR IUD 1 each by Intrauterine route once.    Marland Kitchen LINZESS 145 MCG CAPS capsule TAKE 1 CAPSULE BY MOUTH EVERY MORNING ON EMPTY STOMACH 30 capsule 0  . montelukast (SINGULAIR) 10 MG tablet Take 1 tablet by mouth at bedtime.    Marland Kitchen nystatin (MYCOSTATIN) 100000 UNIT/ML suspension Take 5 mLs (500,000 Units total) by mouth 4 (four) times daily. 60 mL 0  . omeprazole (PRILOSEC) 40 MG capsule Take 1 capsule (40 mg total) by mouth daily. 90 capsule 3  . Plecanatide (TRULANCE) 3 MG TABS Take 3 mg by mouth daily. 30 tablet 3  . zolpidem (AMBIEN) 10 MG tablet TAKE 1 TABLET(10 MG) BY MOUTH AT BEDTIME 30 tablet 0   No current facility-administered medications on file prior to visit.     BP 121/79   Pulse 91   Temp 98.3 F (36.8 C) (Oral)   Resp 16   Ht 5' 6" (1.676 m)   Wt 158 lb 3.2 oz (71.8 kg)   SpO2 99%   BMI 25.53 kg/m       Objective:   Physical Exam  General Appearance- Not in acute distress.  HEENT Eyes- Scleraeral/Conjuntiva-bilat- Not Yellow. Mouth & Throat- Normal.  Chest and Lung Exam Auscultation: Breath sounds:-Normal. Adventitious sounds:- No Adventitious sounds.  Cardiovascular Auscultation:Rythm - Regular. Heart Sounds -Normal heart sounds.  Abdomen Inspection:-Inspection Normal.  Palpation/Perucssion: Palpation  and Percussion of the abdomen reveal- Non Tender, No Rebound tenderness, No rigidity(Guarding) and No Palpable abdominal masses.  Liver:-Normal.  Spleen:- Normal.   Back- no cva tenderness.        Assessment & Plan:  You have likely viral gastroenteritis. I want you to rest, hydrate, follow bland diet guidlines and take tylenol for fever.  Take imodium otc for diarrhea starting tomorrow at noon.. For nausea of vomiting, I am prescribing zofran.   Zantac for upset stomach  There is some  chance that your have a bacterial infection so I do want you to get stool panel kit and turn that in if more than 3 looses stools within next 24 hours.. Turning stool panel kit earlier will provide us with quicker result of studies and more informed decision if antibiotics are needed.   Bland diet and propel hydration.  Follow up in 7 days or as needed   Edward Saguier, PA-C 

## 2017-06-26 NOTE — Patient Instructions (Addendum)
You have likely viral gastroenteritis. I want you to rest, hydrate, follow bland diet guidlines and take tylenol for fever.  Take imodium otc for diarrhea starting tomorrow at noon. For nausea of vomiting, I am prescribing zofran.   Zantac for upset stomach  There is some chance that your have a bacterial infection so I do want you to get stool panel kit and turn that in if more than 3 looses stools within next 24 hours.. Turning stool panel kit earlier will provide [place] with quicker result of studies and more informed decision if antibiotics are needed.   Bland diet and propel hydration.  Follow up in 7 days or as needed

## 2017-07-02 ENCOUNTER — Other Ambulatory Visit: Payer: Self-pay | Admitting: Family Medicine

## 2017-07-02 DIAGNOSIS — G4700 Insomnia, unspecified: Secondary | ICD-10-CM

## 2017-07-03 NOTE — Telephone Encounter (Signed)
RequestingRemus Loffler: AMBIEN Contract: 09/30/16 UDS:09/30/16 Low risk Last OV: 06/26/17 Next OV:- Last Refill: 06/06/17   Please advise

## 2017-07-25 DIAGNOSIS — L239 Allergic contact dermatitis, unspecified cause: Secondary | ICD-10-CM | POA: Diagnosis not present

## 2017-07-29 ENCOUNTER — Other Ambulatory Visit: Payer: Self-pay | Admitting: Family Medicine

## 2017-07-29 DIAGNOSIS — G4700 Insomnia, unspecified: Secondary | ICD-10-CM

## 2017-07-29 MED ORDER — ZOLPIDEM TARTRATE 10 MG PO TABS: ORAL_TABLET | ORAL | 0 refills | Status: DC

## 2017-07-29 NOTE — Telephone Encounter (Signed)
Rx refill request: Ambien 10 mg   Last filled: 07/03/17 # 30  LOV: 03/11/17          06/26/17  PCP: Zola ButtonLowne- Chase  Pharmacy: verified

## 2017-07-29 NOTE — Telephone Encounter (Signed)
Copied from CRM [id number]. Topic: Quick Communication - Rx Refill/Question >> Jul 29, 2017  9:12 AM Gean BirchwoodWilliams-Neal, Sade R wrote: Medication: zolpidem (AMBIEN) 10 MG tablet  Has the patient contacted their pharmacy? Yes (Agent: If no, request that the patient contact the pharmacy for the refill.) (Agent: If yes, when and what did the pharmacy advise?)  Preferred Pharmacy (with phone number or street name): [id number] - [place], [place] - [address] AT [facility] [address] [phone] (Phone) [phone] (Fax)      Agent: Please be advised that RX refills may take up to 3 business days. We ask that you follow-up with your pharmacy.

## 2017-08-01 DIAGNOSIS — E041 Nontoxic single thyroid nodule: Secondary | ICD-10-CM | POA: Diagnosis not present

## 2017-08-04 ENCOUNTER — Telehealth: Payer: Self-pay | Admitting: Family Medicine

## 2017-08-04 NOTE — Telephone Encounter (Signed)
Please advise 

## 2017-08-04 NOTE — Telephone Encounter (Signed)
She will have to stop [place]---- can break them in half for 1-2 weeks then stop Try trazadone 50 mg 1/2-1 po qhs #30

## 2017-08-04 NOTE — Telephone Encounter (Signed)
Patient stated that she found out that where she gets her prescription from and the manufacturer they use that she is allergic to it.  So she will need another prescription called in for the Ambien.

## 2017-08-04 NOTE — Telephone Encounter (Signed)
Pt states she has previously tried trazodone with no help- she is going to reach out to the manufacturer to see if propylene glycol is an active ingredient in Ambien before switching meds. She will call back.

## 2017-08-04 NOTE — Telephone Encounter (Signed)
Copied from CRM [id number]. Topic: Quick Communication - See Telephone Encounter >> Aug 04, 2017 10:49 AM Jay SchlichterWeikart, Melissa J wrote: CRM for notification. See Telephone encounter for: 08/04/17. Pt called - she found out that she is allergic to propylene glycol.  She researched [place]ien and found out that propylene glycol is in the [place].  She would like to discuss with provider what to do next.  He has been having rashes since November.  Cb is [phone]

## 2017-08-05 ENCOUNTER — Other Ambulatory Visit: Payer: Self-pay | Admitting: Family Medicine

## 2017-08-05 ENCOUNTER — Telehealth: Payer: Self-pay | Admitting: Family Medicine

## 2017-08-05 MED ORDER — ZOLPIDEM TARTRATE 1.75 MG SL SUBL: SUBLINGUAL_TABLET | SUBLINGUAL | 1 refills | Status: DC

## 2017-08-05 NOTE — Telephone Encounter (Signed)
Patient notified

## 2017-08-05 NOTE — Telephone Encounter (Signed)
Name of medication is Intermezzo.  It comes in 1.7mg  or 3.5mg .  [facility].

## 2017-08-05 NOTE — Telephone Encounter (Signed)
See other phone message  

## 2017-08-05 NOTE — Telephone Encounter (Signed)
where

## 2017-08-05 NOTE — Telephone Encounter (Signed)
done

## 2017-08-05 NOTE — Telephone Encounter (Signed)
Copied from CRM [id number]. Topic: General - Other >> Aug 05, 2017 10:27 AM Stephannie LiSimmons, Rayn Shorb L, NT wrote: Reason for CRM: Patient called back with the name of the medication she can take that she can take is  intermezzo  it is Ambien that melts in your tongue ,this is the only one she  able to take , it can be ordered at [facility]  [id number] - [place], [place] - [address] AT [facility] [address] [phone] (Phone) [phone] (Fax  The patient states she will be going out of town on Thursday ,and would like this to take with her if possible , the pharmacy has to order the medication it takes 2 days for to arrive .

## 2017-08-06 ENCOUNTER — Telehealth: Payer: Self-pay

## 2017-08-06 NOTE — Telephone Encounter (Signed)
Kaylyn -- have you seen a PA for this one?

## 2017-08-06 NOTE — Telephone Encounter (Signed)
PA initiated via Covermymeds; KEY: [id number]. Awaiting determination.

## 2017-08-06 NOTE — Telephone Encounter (Signed)
Insurance is requiring PA, patient is going out of town Advertising account executivetomorrow. Please advise [phone]

## 2017-08-06 NOTE — Telephone Encounter (Signed)
I have not seen a PA request for this, however I have initiated via Covermymeds.

## 2017-08-07 ENCOUNTER — Telehealth: Payer: Self-pay | Admitting: Family Medicine

## 2017-08-07 ENCOUNTER — Other Ambulatory Visit: Payer: Self-pay | Admitting: Family Medicine

## 2017-08-07 MED ORDER — ZOLPIDEM TARTRATE 1.75 MG SL SUBL: SUBLINGUAL_TABLET | SUBLINGUAL | 1 refills | Status: DC

## 2017-08-07 NOTE — Telephone Encounter (Signed)
PA denied. Requested medication and/or diagnosis are not a covered benefit and are excluded from coverage.

## 2017-08-07 NOTE — Telephone Encounter (Signed)
See other phone message patient got override.

## 2017-08-07 NOTE — Telephone Encounter (Signed)
Copied from CRM [id number]. Topic: Quick Communication - Rx Refill/Question >> Aug 07, 2017 12:53 PM Burchel, Abbi R wrote: Medication: Zolpidem Tartrate 1.75 MG SUBL  [id number]- [place], [place] - [address] AT [address] [place] [address] Phone: [phone] Fax: [phone]    Pt states she was finally able to get this medication approved by her insurance, and needs it sent to this pharmacy while she is on vacation.  After her vacation she wants to pick it up at her regular pharmacy.

## 2017-08-07 NOTE — Telephone Encounter (Signed)
Medication could not be transferred.  Patient is on [place] [facility] [place]  #30 no refills

## 2017-08-07 NOTE — Telephone Encounter (Signed)
Patient is calling and needing to know the next steps in regards to her medication being denied by insurance. Please contact the patient.

## 2017-08-14 ENCOUNTER — Encounter: Payer: Self-pay | Admitting: Medical

## 2017-08-15 ENCOUNTER — Telehealth: Payer: Self-pay | Admitting: Medical

## 2017-08-15 NOTE — Telephone Encounter (Signed)
Dr. Laury AxonLowne,   Pt of yours is requesting doxycycline. I saw her on 12-17-2016 for bronchitis and rx'd doxy back then. Dr. Patsy Lageropland saw her for skin condition on 02/06/2017.    Pt has persisting skin condition for which I never saw her for. Her friend who has skin condition got better with doxy. Thus she thinks she needs another doxy. Having not seen her for a while and not for this condition I don't want to rx doxy. Her message below.   Precious GildingHey Angela Bryant, This is [name] and I saw you in December 2018 for itching and having skin issues. You prescribed me doxycycline for an antibiotic and I for some reason only took like 3 days of it. After speaking to a friend of mine she had the same issues as I have and took that and it helped her. She doesn't have issues anymore. Is there a way for me to get another prescription to see if it will help me? I'm desperate to stop itching and having issues.   Thanks  [name]   If you look at Dr.Copland notes appears she has been to many doctors and unclear if she followed through with last specialist visit.   I will let her passing message to you. And suggest that you may ask her to come in.  Thanks [name]

## 2017-08-15 NOTE — Telephone Encounter (Signed)
Copied from CRM [id number]. Topic: Quick Communication - See Telephone Encounter >> Aug 15, 2017  3:31 PM Herby Bryant, Angela C wrote: CRM for notification. See Telephone encounter for: 08/15/17.  Pt says that she was previously taking one type of Ambien and began to have an reaction, pt thought that something in the ingredients of Ambien was causing it so provider changed her Rx. Pt says that she just found out that the original Rx is okay and is not causing the reaction, pt would like to be switched back to the original Rx. Pt says ALSO her insurance will not cover the new Ambien Rx    Please assist further.

## 2017-08-17 NOTE — Telephone Encounter (Signed)
She would need an ov

## 2017-08-20 ENCOUNTER — Telehealth: Payer: Self-pay | Admitting: *Deleted

## 2017-08-20 NOTE — Telephone Encounter (Signed)
Patient would like to know from you if she should when she run out of regular ambien if she should take the dissolvable ambien 1.75mg  since she cannot return to pharmacy (she got it out of town) or do you recommend she just stay on her regular [place].

## 2017-08-20 NOTE — Telephone Encounter (Signed)
Lurline Harearter, Karen E, LPN     Copied from CRM [phone] [id number]. Topic: Quick Communication - See Telephone Encounter >> Aug 15, 2017  3:31 PM Herby AbrahamJohnson, Shiquita C wrote: CRM for notification. See Telephone encounter for: 08/15/17.  Pt says that she was previously taking one type of Ambien and began to have an reaction, pt thought that something in the ingredients of Ambien was causing it so provider changed her Rx. Pt says that she just found out that the original Rx is okay and is not causing the reaction, pt would like to be switched back to the original Rx. Pt says ALSO her insurance will not cover the new Ambien Rx    Please assist further.

## 2017-08-20 NOTE — Telephone Encounter (Signed)
Patient already taken care of this

## 2017-08-21 ENCOUNTER — Encounter: Payer: Self-pay | Admitting: *Deleted

## 2017-08-21 ENCOUNTER — Other Ambulatory Visit: Payer: Self-pay | Admitting: Family Medicine

## 2017-08-21 DIAGNOSIS — G4700 Insomnia, unspecified: Secondary | ICD-10-CM

## 2017-08-21 NOTE — Telephone Encounter (Signed)
Notified patient that it will be better to stay on the 10mg .

## 2017-08-21 NOTE — Telephone Encounter (Signed)
Refill Request: Zolpidem  Last RX: 07/29/17 Last OV:06/26/17 Next NF:AOZHOV:None scheduled  UDS:09/30/16 CSC:09/30/17

## 2017-08-21 NOTE — Telephone Encounter (Signed)
Note    Patient would like to know from you if she should when she run out of regular ambien if she should take the dissolvable ambien 1.75mg  since she cannot return to pharmacy (she got it out of town) or do you recommend she just stay on her regular [place].

## 2017-08-21 NOTE — Telephone Encounter (Signed)
So if the 10 mg is working it is easier to find and get paid for. If it does not work as well we can switch her to the 1.75 mg but may have trouble getting it covered and finding it

## 2017-08-25 DIAGNOSIS — L309 Dermatitis, unspecified: Secondary | ICD-10-CM | POA: Diagnosis not present

## 2017-08-25 DIAGNOSIS — L509 Urticaria, unspecified: Secondary | ICD-10-CM | POA: Diagnosis not present

## 2017-08-25 NOTE — Telephone Encounter (Signed)
[facility] [place] and is requesting early refill.  Review of controlled substance registry shows that she filled 1.75mg  tabs on7/26. Our office sent 10mg  tabs on 08/21/17 which does not appear to be filled.  She should be able to fill the 10mg  from the 8/8 rx. If she already picked those up and lost them then we cannot provide an early refill per controlled substance contract.

## 2017-08-29 DIAGNOSIS — L2089 Other atopic dermatitis: Secondary | ICD-10-CM | POA: Diagnosis not present

## 2017-08-29 DIAGNOSIS — L235 Allergic contact dermatitis due to other chemical products: Secondary | ICD-10-CM | POA: Diagnosis not present

## 2017-09-01 ENCOUNTER — Ambulatory Visit: Payer: Self-pay

## 2017-09-01 DIAGNOSIS — R21 Rash and other nonspecific skin eruption: Secondary | ICD-10-CM | POA: Diagnosis not present

## 2017-09-01 NOTE — Telephone Encounter (Signed)
.  Incoming call from patient with complaint of hives appearing like a fine rash from waist up. On face and neck.   Unable to count.  Patient states that it started in November 2018. Rates the itching as severe.  It is a recurring issue has seen numerous of providers.  [facility] and dermatologists.  Patient questions if having an IUD maybe an issue. Denies having a fever or any difficulty breathing nor pain.  Last menstrual period was 4/18. Patient has an appointment scheduled for Monday with  Dr. Zola ButtonLowne-Chase. Also is in the process of scheduling appointment with OB-GYN to discuss possibility if allergic to IUD. Pt was also at Urgent Care waiting to be seen while talking with me.  States she had an anxiety attack over the weekend related to this.  Patients states she will keep Monday's appointment with Dr. Laury AxonLowne- Almeta Monashase.     Reason for Disposition . [1] Hives has occurred 3 or more times in the last year AND [2] the cause was not found  Answer Assessment - Initial Assessment Questions 1. APPEARANCE: "What does the rash look like?"      Small bumps  Looks like rask  2. LOCATION: "Where is the rash located?"      Face and neck , waist up 3. NUMBER: "How many hives are there?"      cant count 4. SIZE: "How big are the hives?" (inches, cm, compare to coins) "Do they all look the same or is there lots of variation in shape and size?"      *No Answer*fine rash 5. ONSET: "When did the hives begin?" (Hours or days ago)      Nov.18 6. ITCHING: "Does it itch?" If so, ask: "How bad is the itch?"    - MILD: doesn't interfere with normal activities   - MODERATE - SEVERE: interferes with work, school, sleep, or other activities    severe 7. RECURRENT PROBLEM: "Have you had hives before?" If so, ask: "When was the last time?" and "What happened that time?"      yes 8. TRIGGERS: "Were you exposed to any new food, plant, cosmetic product or animal just before the hives began?"     IUD? 9. OTHER  SYMPTOMS: "Do you have any other symptoms?" (e.g., fever, tongue swelling, difficulty breathing, abdominal pain)    denies 10. PREGNANCY: "Is there any chance you are pregnant?" "When was your last menstrual period?"       Since April of 2018  Protocols used: HIVES-A-AH

## 2017-09-08 ENCOUNTER — Encounter: Payer: Self-pay | Admitting: Family Medicine

## 2017-09-08 ENCOUNTER — Ambulatory Visit (INDEPENDENT_AMBULATORY_CARE_PROVIDER_SITE_OTHER): Payer: 59 | Admitting: Family Medicine

## 2017-09-08 VITALS — BP 127/72 | HR 72 | Temp 98.6°F | Resp 16 | Ht 66.0 in | Wt 162.2 lb

## 2017-09-08 DIAGNOSIS — K219 Gastro-esophageal reflux disease without esophagitis: Secondary | ICD-10-CM | POA: Diagnosis not present

## 2017-09-08 DIAGNOSIS — F419 Anxiety disorder, unspecified: Secondary | ICD-10-CM | POA: Insufficient documentation

## 2017-09-08 MED ORDER — OMEPRAZOLE 40 MG PO CPDR: 40.0000 mg | DELAYED_RELEASE_CAPSULE | Freq: Every day | ORAL | 3 refills | Status: DC

## 2017-09-08 MED ORDER — ESCITALOPRAM OXALATE 10 MG PO TABS: 10.0000 mg | ORAL_TABLET | Freq: Every day | ORAL | 2 refills | Status: DC

## 2017-09-08 NOTE — Assessment & Plan Note (Signed)
Start lexapro 10 mg daily rto 1 month or sooner prn  

## 2017-09-08 NOTE — Patient Instructions (Signed)

## 2017-09-08 NOTE — Progress Notes (Signed)
Patient ID: Angela Bryant, female    DOB: 1980-11-03  Age: 37 y.o. MRN: [id number]    Subjective:  Subjective  HPI Angela Bryant presents for f/u derm.  Pt is having inc anxiety over the rash/ itching and she is irritated that [facility] has not answered her back yet about her questions   Review of Systems  Constitutional: Negative for appetite change, diaphoresis, fatigue and unexpected weight change.  Eyes: Negative for pain, redness and visual disturbance.  Respiratory: Negative for cough, chest tightness, shortness of breath and wheezing.   Cardiovascular: Negative for chest pain, palpitations and leg swelling.  Endocrine: Negative for cold intolerance, heat intolerance, polydipsia, polyphagia and polyuria.  Genitourinary: Negative for difficulty urinating, dysuria and frequency.  Neurological: Negative for dizziness, light-headedness, numbness and headaches.  Psychiatric/Behavioral: Negative for dysphoric mood. The patient is nervous/anxious.     History Past Medical History:  Diagnosis Date  . Anxiety   . Asthma   . GERD (gastroesophageal reflux disease)   . Insomnia   . Irritable bowel syndrome     She has a past surgical history that includes Anal Rectal manometry (N/A, 01/04/2015).   Her family history includes Asthma in her mother; Hypertension in her father.She reports that she has been smoking cigarettes. She has never used smokeless tobacco. She reports that she drinks alcohol. She reports that she does not use drugs.  Current Outpatient Medications on File Prior to Visit  Medication Sig Dispense Refill  . ADVAIR DISKUS 250-50 MCG/DOSE AEPB Inhale 1 puff into the lungs at bedtime.     Marland Kitchen albuterol (PROVENTIL HFA;VENTOLIN HFA) 108 (90 Base) MCG/ACT inhaler Inhale 2 puffs into the lungs every 6 (six) hours as needed. Reported on 02/01/2015 1 Inhaler 2  . cyclobenzaprine (FLEXERIL) 10 MG tablet TAKE 1 TABLET BY MOUTH THREE TIMES DAILY AS NEEDED FOR MUSCLE SPASMS 90  tablet 0  . fluticasone (FLONASE) 50 MCG/ACT nasal spray Place 2 sprays into both nostrils daily. 16 g 6  . ibuprofen (ADVIL,MOTRIN) 800 MG tablet TAKE 1 TABLET(800 MG) BY MOUTH EVERY 8 HOURS AS NEEDED 90 tablet 0  . levonorgestrel (MIRENA) 20 MCG/24HR IUD 1 each by Intrauterine route once.    . montelukast (SINGULAIR) 10 MG tablet Take 1 tablet by mouth at bedtime.    Marland Kitchen Plecanatide (TRULANCE) 3 MG TABS Take 3 mg by mouth daily. 30 tablet 3  . zolpidem (AMBIEN) 10 MG tablet TAKE 1 TABLET(10 MG) BY MOUTH AT BEDTIME 30 tablet 0   No current facility-administered medications on file prior to visit.      Objective:  Objective  Physical Exam  Constitutional: She is oriented to person, place, and time. She appears well-developed and well-nourished.  HENT:  Head: Normocephalic and atraumatic.  Eyes: Conjunctivae and EOM are normal.  Neck: Normal range of motion. Neck supple. No JVD present. Carotid bruit is not present. No thyromegaly present.  Cardiovascular: Normal rate, regular rhythm and normal heart sounds.  No murmur heard. Pulmonary/Chest: Effort normal and breath sounds normal. No respiratory distress. She has no wheezes. She has no rales. She exhibits no tenderness.  Musculoskeletal: She exhibits no edema.  Neurological: She is alert and oriented to person, place, and time.  Psychiatric: Her speech is normal and behavior is normal. Judgment and thought content normal. Her mood appears anxious. Cognition and memory are normal. She does not exhibit a depressed mood.  Nursing note and vitals reviewed.  BP 127/72 (BP Location: Right Arm, Cuff Size: Normal)  Pulse 72   Temp 98.6 F (37 C) (Oral)   Resp 16   Ht 5\' 6"  (1.676 m)   Wt 162 lb 3.2 oz (73.6 kg)   SpO2 99%   BMI 26.18 kg/m  Wt Readings from Last 3 Encounters:  09/08/17 162 lb 3.2 oz (73.6 kg)  06/26/17 158 lb 3.2 oz (71.8 kg)  02/06/17 164 lb 12.8 oz (74.8 kg)     Lab Results  Component Value Date   WBC 6.5  06/26/2017   HGB 13.0 06/26/2017   HCT 38.2 06/26/2017   PLT 175.0 06/26/2017   GLUCOSE 87 06/26/2017   ALT 17 06/26/2017   AST 15 06/26/2017   NA 136 06/26/2017   K 4.2 06/26/2017   CL 103 06/26/2017   CREATININE 0.74 06/26/2017   BUN 8 06/26/2017   CO2 28 06/26/2017   TSH 0.85 12/23/2012    US Thyroid  Result Date: 04/25/2017 CLINICAL DATA:  Right thyroid nodule. EXAM: THYROID ULTRASOUND TECHNIQUE: Ultrasound examination of the thyroid gland and adjacent soft tissues was performed. COMPARISON:  None. FINDINGS: Parenchymal Echotexture: Mildly heterogenous Isthmus: 0.3 cm Right lobe: 4.9 x 1.3 x 1.9 cm Left lobe: 5.4 x 1.4 x 1.8 cm _________________________________________________________ Estimated total number of nodules >/= 1 cm: 2 Number of spongiform nodules >/=  2 cm not described below (TR1): 0 Number of mixed cystic and solid nodules >/= 1.5 cm not described below (TR2): 0 _________________________________________________________ Nodule # 1: Location: Right; Mid Maximum size: 2.0 cm; Other 2 dimensions: 1.0 x 1.4 cm Composition: mixed cystic and solid (1) Echogenicity: cannot determine (1) Shape: not taller-than-wide (0) Margins: smooth (0) Echogenic foci: large comet-tail artifacts (0) ACR TI-RADS total points: 2. ACR TI-RADS risk category: TR2 (2 points). ACR TI-RADS recommendations: This nodule does NOT meet TI-RADS criteria for biopsy or dedicated follow-up. _________________________________________________________ There are 2 additional hypoechoic nodules in the right thyroid lobe and the largest measures 0.6 cm. The small right thyroid nodules do not meet criteria for biopsy or dedicated follow-up. Nodule # 2: Location: Left; Inferior Maximum size: 1.6 cm; Other 2 dimensions: 0.6 x 1.0 cm Composition: mixed cystic and solid (1) Echogenicity: cannot determine (1) Shape: not taller-than-wide (0) Margins: ill-defined (0) Echogenic foci: large comet-tail artifacts (0) ACR TI-RADS total  points: 2. ACR TI-RADS risk category: TR2 (2 points). ACR TI-RADS recommendations: This nodule does NOT meet TI-RADS criteria for biopsy or dedicated follow-up. _________________________________________________________ IMPRESSION: Bilateral thyroid nodules. Dominant thyroid nodules are mixed cystic and solid composition and do not meet criteria for biopsy. The above is in keeping with the ACR TI-RADS recommendations - J Am Coll Radiol 2017;14:587-595. Electronically Signed   By: Richarda Overlie M.D.   On: 04/25/2017 16:27     Assessment & Plan:  Plan  I have discontinued Gwendolyn Mclees. Gessel's levocetirizine, LINZESS, hydrOXYzine, nystatin, fluconazole, and ondansetron. I am also having her start on escitalopram. Additionally, I am having her maintain her ADVAIR DISKUS, montelukast, fluticasone, levonorgestrel, albuterol, cyclobenzaprine, Plecanatide, ibuprofen, zolpidem, and omeprazole.  Meds ordered this encounter  Medications  . omeprazole (PRILOSEC) 40 MG capsule    Sig: Take 1 capsule (40 mg total) by mouth daily.    Dispense:  90 capsule    Refill:  3  . escitalopram (LEXAPRO) 10 MG tablet    Sig: Take 1 tablet (10 mg total) by mouth daily.    Dispense:  30 tablet    Refill:  2    Problem List Items Addressed This Visit  Unprioritized   Anxiety - Primary    Start lexapro 10 mg daily  rto  1 month or sooner prn       Relevant Medications   escitalopram (LEXAPRO) 10 MG tablet   GERD    meds refilled because symptoms starting to return       Relevant Medications   omeprazole (PRILOSEC) 40 MG capsule      Follow-up: Return in about 4 weeks (around 10/06/2017), or if symptoms worsen or fail to improve.  Donato SchultzYvonne R Lowne Chase, DO

## 2017-09-08 NOTE — Assessment & Plan Note (Signed)
meds refilled because symptoms starting to return

## 2017-09-21 ENCOUNTER — Telehealth: Payer: Self-pay | Admitting: Family Medicine

## 2017-09-21 DIAGNOSIS — G4700 Insomnia, unspecified: Secondary | ICD-10-CM

## 2017-09-22 ENCOUNTER — Encounter: Payer: Self-pay | Admitting: Family Medicine

## 2017-09-22 NOTE — Telephone Encounter (Signed)
Patient called and states she is completely out of this medication . She is unable to be without this medication. Please advise.

## 2017-09-22 NOTE — Telephone Encounter (Signed)
Refill of Ambien  LOV 09/08/17 Dr. Zola Button  LRF 08/21/17  #30  0 refills   [id number] - [place], [place] - [address]

## 2017-09-22 NOTE — Telephone Encounter (Signed)
Refill Request: Zolpidem   Last RX: 08/21/17 Last OV:09/08/17 Next OV: None scheduled  UDS:09/30/16 CSC:09/30/16 CSR:

## 2017-09-23 ENCOUNTER — Encounter: Payer: Self-pay | Admitting: Family Medicine

## 2017-09-23 ENCOUNTER — Other Ambulatory Visit: Payer: Self-pay | Admitting: Family Medicine

## 2017-09-23 DIAGNOSIS — M62838 Other muscle spasm: Secondary | ICD-10-CM

## 2017-09-23 MED ORDER — CYCLOBENZAPRINE HCL 5 MG PO TABS: 5.0000 mg | ORAL_TABLET | Freq: Three times a day (TID) | ORAL | 1 refills | Status: DC | PRN

## 2017-09-23 NOTE — Telephone Encounter (Signed)
Ok to refill 

## 2017-09-23 NOTE — Telephone Encounter (Signed)
Pt is calling in wanting to know if the authorization can be done today for her to receive meds early due to not having any at all and not sleeping

## 2017-09-23 NOTE — Telephone Encounter (Signed)
I can not fill it early if the pharmacy said its too early ---- if the bottle says the 8th she should bring the bottle into the pharmacy

## 2017-09-23 NOTE — Telephone Encounter (Signed)
Dr Zola Button -- please see NARX report and advise. I did confirm fill dates and quantities on NARX report with pharmacy.  Please advise?   Angela Bryant L      09/23/17 12:22 PM  Note    Pt states that she hasn't had this medication since  Saturday.  Pt states she is starting to go through withdrawal and wants to know if this can be called in ASAP.

## 2017-09-23 NOTE — Telephone Encounter (Signed)
Pt states that she hasn't had this medication since  Saturday.  Pt states she is starting to go through withdrawal and wants to know if this can be called in ASAP.

## 2017-09-23 NOTE — Telephone Encounter (Signed)
° ° ° °  Pt said she need the med today and that she did take the bottle to the pharmacy to show it was filled on 08/21/17 and there is nothing they can do till the doctor call and say fill early

## 2017-09-23 NOTE — Telephone Encounter (Signed)
[facility] Need verbal to fill early, CB # [phone]

## 2017-09-24 NOTE — Telephone Encounter (Signed)
We are going to have to discuss all the meds--- she is on too much and the combo is bad I refilled am

## 2017-09-24 NOTE — Telephone Encounter (Signed)
Database ran and is on your desk for review.  Last filled per database: 08/27/17 Last written: 08/21/17 Last ov: 09/08/17 Next ov: none Contract: 09/30/17 UDS: 09/30/17  Tried calling patient to see why she is out so early.  Will myhcart patient.  Looks like she is due on 08/26/17

## 2017-09-24 NOTE — Telephone Encounter (Signed)
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

## 2017-09-25 NOTE — Telephone Encounter (Signed)
Spoke with Lowne advised that there were a couple of messages in the mix and I advised pharmacy ok to refill early med since she sent in on 09/22/17.  Patient agreed to come in for an appointment to discuss medication.  I also advised that this medication was to last 30 days.  Did not advise of Dr. Laury AxonLowne note her on this encounter.  Patient must have an appt before next refill.

## 2017-10-14 ENCOUNTER — Ambulatory Visit: Payer: 59 | Admitting: Physician Assistant

## 2017-10-19 ENCOUNTER — Other Ambulatory Visit: Payer: Self-pay | Admitting: Family Medicine

## 2017-10-19 DIAGNOSIS — G4700 Insomnia, unspecified: Secondary | ICD-10-CM

## 2017-10-20 NOTE — Telephone Encounter (Signed)
Refill request for Ambien 10mg .   Last OV: 09/23/2017 Last Fill: 09/22/2017 #30 and 1OX UDS: 09/30/2016 Low risk

## 2017-10-20 NOTE — Telephone Encounter (Signed)
Pt calling to check status  °

## 2017-10-29 ENCOUNTER — Encounter: Payer: Self-pay | Admitting: Physician Assistant

## 2017-10-29 ENCOUNTER — Ambulatory Visit: Payer: Self-pay | Admitting: *Deleted

## 2017-10-29 ENCOUNTER — Ambulatory Visit: Payer: 59 | Admitting: Physician Assistant

## 2017-10-29 VITALS — BP 132/80 | HR 100 | Ht 66.0 in | Wt 155.0 lb

## 2017-10-29 DIAGNOSIS — K5904 Chronic idiopathic constipation: Secondary | ICD-10-CM | POA: Diagnosis not present

## 2017-10-29 DIAGNOSIS — K219 Gastro-esophageal reflux disease without esophagitis: Secondary | ICD-10-CM

## 2017-10-29 DIAGNOSIS — F419 Anxiety disorder, unspecified: Secondary | ICD-10-CM | POA: Diagnosis not present

## 2017-10-29 MED ORDER — LINACLOTIDE 72 MCG PO CAPS: 72.0000 ug | ORAL_CAPSULE | Freq: Every day | ORAL | 2 refills | Status: DC

## 2017-10-29 NOTE — Telephone Encounter (Signed)
Pt calling stating that last night after taking Lexapro 10 mg she began to "feel weird". Pt states that around 4 am this morning she noted that her pupils were dilated, her heart was racing,experienced vomiting and had approximately 4 bowel movements last night that she states were not diarrhea. Pt states that she was initially prescribed Lexapro,two months ago and when she took the medication the first time she experienced similar symptoms but they were not as severe as what she experienced last night. Pt states she has only taken the medication twice since it was prescribed including last night. Pt states she tried to take the medication last night again due to feeling anxious.Pt states she also has red blotchy areas to her face and neck that itch. Pt states she was seen today by her GI doctor who noted that she may have experienced a reaction to the Lexapro. Pt states that her BP(140/86) and HR(not specified) was increased which is not usual for her. Pt advised that she should be seen in the next 24 hours regarding current symptoms. Pt states she would not like to make an appt at this time due to being in the office recently and would just like to know Dr. Maryln Gottron recommendations. Pt states she did discard current bottle of Lexapro.Pt advised that if symptoms become worse to call the office back or to seek care at Urgent Care. Pt verbalized understanding.  Reason for Disposition . Hives or itching  Answer Assessment - Initial Assessment Questions 1. APPEARANCE of RASH: "Describe the rash." (e.g., spots, blisters, raised areas, skin peeling, scaly)     Pt describes skin as being red and blotchy around the face and neck 2. SIZE: "How big are the spots?" (e.g., tip of pen, eraser, coin; inches, centimeters)     States skin is blotchy size of area not specified 3. LOCATION: "Where is the rash located?"     On face and neck 4. COLOR: "What color is the rash?" (Note: It is difficult to assess rash  color in people with darker-colored skin. When this situation occurs, simply ask the caller to describe what they see.)     red 5. ONSET: "When did the rash begin?"     Last night after taking Lexapro 10 mg 6. FEVER: "Do you have a fever?" If so, ask: "What is your temperature, how was it measured, and when did it start?"     No 7. ITCHING: "Does the rash itch?" If so, ask: "How bad is the itch?" (Scale 1-10; or mild, moderate, severe)     Pt states she is experiencing itching around face and neck currently. Pt states that the itching began on last night. 8. CAUSE: "What do you think is causing the rash?"     Recently took a dose of Lexapro 9. NEW MEDICATION: "What new medication are you taking?" (e.g., name of antibiotic) "When did you start taking this medication?".     Lexapro10 mg, took a dose last night but was initially prescribed the medication 2 months ago and experienced similar symptoms when she took the medication the 1st time 10. OTHER SYMPTOMS: "Do you have any other symptoms?" (e.g., sore throat, fever, joint pain)        11. PREGNANCY: "Is there any chance you are pregnant?" "When was your last menstrual period?"       n/a  Protocols used: RASH - WIDESPREAD ON DRUGS-A-AH

## 2017-10-29 NOTE — Progress Notes (Signed)
I agree with the above note, plan 

## 2017-10-29 NOTE — Progress Notes (Signed)
Subjective:    Patient ID: Angela Bryant, female    DOB: 03-23-1980, 37 y.o.   MRN: [id number]  HPI Angela Bryant is a pleasant 37 year old white female known to Dr. Christella Hartigan who was last seen in the office in August 2019 by Willette Cluster, NP.  She has history of GERD and chronic constipation. She was started on omeprazole 40 mg p.o. every morning, which is working well. She was given samples of Trulance 3 mg to take for her chronic constipation.  She had tried Linzess in the past apparently caused problems with diarrhea and MiraLAX was not beneficial.  She cannot recall whether she had taken Amitiza or not previously.  Dates she tried to stay on the true Micah Noel regularly but it gives her diarrhea and she is unable to tolerate it and works during the day.  She says without the medication she is having a bowel movement every couple of days and if she gets more constipated she will take a dose of the true Gloster to clean herself out.  Her stomach does bother her sometimes early in the mornings and is usually better as the day goes on. She is most concerned today about an event that happened last night.  She says she ate a raw tuna bowl, mixed with crab for dinner last night , then took a dose of Ambien 10 mg and Lexapro which she had been given previously by her PCP for anxiety.  She says she only took 1 dose of it previously a few months ago and says she did not like the way it made her feel so she had not tried it again.  She says she been feeling very anxious recently and decided to take it last night.  She woke up at about 4 AM, feeling anxious and with a weird, drugs type feeling.  She went into the bathroom because she was nauseated, had dry heaves but no emesis and no diarrhea.  She says her pupils appeared to be large, and she says she is just felt bad and off ever since.  Review of Systems Pertinent positive and negative review of systems were noted in the above HPI section.  All other review of  systems was otherwise negative.  Outpatient Encounter Medications as of 10/29/2017  Medication Sig  . ADVAIR DISKUS 250-50 MCG/DOSE AEPB Inhale 1 puff into the lungs at bedtime.   Marland Kitchen albuterol (PROVENTIL HFA;VENTOLIN HFA) 108 (90 Base) MCG/ACT inhaler Inhale 2 puffs into the lungs every 6 (six) hours as needed. Reported on 02/01/2015  . cyclobenzaprine (FLEXERIL) 5 MG tablet Take 1 tablet (5 mg total) by mouth 3 (three) times daily as needed for muscle spasms.  Marland Kitchen ibuprofen (ADVIL,MOTRIN) 800 MG tablet TAKE 1 TABLET(800 MG) BY MOUTH EVERY 8 HOURS AS NEEDED  . levonorgestrel (MIRENA) 20 MCG/24HR IUD 1 each by Intrauterine route once.  . montelukast (SINGULAIR) 10 MG tablet Take 1 tablet by mouth at bedtime.  Marland Kitchen omeprazole (PRILOSEC) 40 MG capsule Take 1 capsule (40 mg total) by mouth daily.  Marland Kitchen zolpidem (AMBIEN) 10 MG tablet TAKE 1 TABLET(10 MG) BY MOUTH AT BEDTIME  . linaclotide (LINZESS) 72 MCG capsule Take 1 capsule (72 mcg total) by mouth daily before breakfast.  . Plecanatide (TRULANCE) 3 MG TABS Take 3 mg by mouth daily.  . [DISCONTINUED] escitalopram (LEXAPRO) 10 MG tablet Take 1 tablet (10 mg total) by mouth daily.  . [DISCONTINUED] fluticasone (FLONASE) 50 MCG/ACT nasal spray Place 2 sprays into both nostrils daily.  No facility-administered encounter medications on file as of 10/29/2017.    Allergies  Allergen Reactions  . Codeine Nausea And Vomiting   Patient Active Problem List   Diagnosis Date Noted  . Anxiety 09/08/2017  . Chronic idiopathic constipation 06/10/2013  . ALLERGIC RHINITIS 04/14/2006  . ASTHMA 04/14/2006  . GERD 04/14/2006   Social History   Socioeconomic History  . Marital status: Married    Spouse name: Not on file  . Number of children: 0  . Years of education: Not on file  . Highest education level: Not on file  Occupational History  . 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 Financial resource strain: Not on file  . Food  insecurity:    Worry: Not on file    Inability: Not on file  . Transportation needs:    Medical: Not on file    Non-medical: Not on file  Tobacco Use  . Smoking status: Light Tobacco Smoker    Types: Cigarettes  . Smokeless tobacco: Never Used  . Tobacco comment: rarely has a cigarrette, sometimes on weekends  Substance and Sexual Activity  . Alcohol use: Yes    Alcohol/week: 0.0 standard drinks    Comment: occasional   . Drug use: No  . Sexual activity: Yes    Partners: Male  Lifestyle  . Physical activity:    Days per week: Not on file    Minutes per session: Not on file  . Stress: Not on file  Relationships  . Social connections:    Talks on phone: Not on file    Gets together: Not on file    Attends religious service: Not on file    Active member of club or organization: Not on file    Attends meetings of clubs or organizations: Not on file    Relationship status: Not on file  . Intimate partner violence:    Fear of current or ex partner: Not on file    Emotionally abused: Not on file    Physically abused: Not on file    Forced sexual activity: Not on file  Other Topics Concern  . Not on file  Social History Narrative  . Not on file    Ms. Locker's family history includes Asthma in her mother; Hypertension in her father.      Objective:    Vitals:   10/29/17 0930  BP: 132/80  Pulse: 100    Physical Exam; well-developed young white female in no acute distress, anxious blood pressure 132/80 pulse 100, BMI 25.0.  HEENT; nontraumatic normocephalic EOMI PERRLA sclera anicteric oral mucosa moist, Cardiovascular; regular rate and rhythm with S1-S2 slightly tacky Pulmonary; clear bilaterally, Abdomen ;soft, nontender nondistended bowel sounds are active there is no palpable mass or hepatosplenomegaly, Rectal ;exam not done, Extremities ;no clubbing cyanosis or edema skin warm and dry, Neuro psych ;alert and oriented, grossly nonfocal mood and affect appropriate, she  is anxious       Assessment & Plan:   #73 37 year old white female with chronic constipation-unable to tolerate true Lance on a regular basis as it causes diarrhea.  She has been using as needed  #2 GERD-stable #3 anxiety #4 suspect adverse medication reaction to Lexapro which she took last evening, in combination with Ambien which she has been taking for years.  She woke up with nausea, dry heaves anxiety very dilation and a drugged, foggy sensation which has been persistent over the past hours.  Plan; Continue omeprazole 40 mg p.o. every morning We will try low-dose Linzess 72 mcg p.o. every morning.  If this causes too much increase in stooling, she can use every other day or every third day as needed. I gave her a work note for today as she is advised to go home and rest, push fluids and contact her PCP regarding adverse reaction to Lexapro, or Lexapro Ambien combination.  She has not had an allergic reaction I advised her not to take anymore Lexapro.,  And to avoid Ambien this evening.  Hopefully she will feel better as the day goes on and Lexapro  washes out of her system. She can follow-up with Dr. Christella Hartigan or myself on an as-needed basis. Marquette Piontek S Rony Ratz PA-C 10/29/2017   Cc: Donato Schultz, *

## 2017-10-29 NOTE — Patient Instructions (Signed)
Stop the Lexapro.  Go home and rest today.  Push fluids then bland food today.  Call Dr. Hulan Saas office this morning.   We have given you samples of Linzess 72 mcg.   Normal BMI (Body Mass Index- based on height and weight) is between 19 and 25. Your BMI today is Body mass index is 25.02 kg/m. Marland Kitchen Please consider follow up  regarding your BMI with your Primary Care Provider.

## 2017-10-30 NOTE — Telephone Encounter (Signed)
Try paxil 10 mg 1 po qd #30  ,2  Refills Or effexor 37.5 mg daily if she has tried paxil before

## 2017-10-30 NOTE — Telephone Encounter (Signed)
Pt needs ov ----  I'm full --already overbooked Does anyone else have anything

## 2017-10-30 NOTE — Telephone Encounter (Signed)
Pt. states she has tried prozac before, and it made her "feel weird: Pt. Is requesting alternative, "the lowest dose possible". Pt. agreed to make 36mo. F/u appointment for 11/11.

## 2017-10-30 NOTE — Telephone Encounter (Signed)
prozac 10 mg 1 po qd  #30  2 refills F/u 1 month

## 2017-10-30 NOTE — Telephone Encounter (Signed)
Author phoned pt re: possible reaction/SE to lexapro. Pt. Stated she is "fine now", no residual symptoms, and just wants an alternative medication for her anxiety. Pt. stated she does not want to come in for an OV for a medication change. Routed to Dr. Laury Axon to advise.

## 2017-10-31 MED ORDER — PAROXETINE HCL 10 MG PO TABS: 10.0000 mg | ORAL_TABLET | Freq: Every day | ORAL | 2 refills | Status: DC

## 2017-10-31 NOTE — Addendum Note (Signed)
Addended by: Valentina Gu R on: 10/31/2017 09:03 AM   Modules accepted: Orders

## 2017-10-31 NOTE — Telephone Encounter (Signed)
From phone conversation 10/17 with Thereasa Parkin, pt. Stated she was OK with [place] sending in an alternative rx into [facility] without phoning her. Paxil sent in per Dr.  Ernst Spell instructions. Mychart message sent to notify.

## 2017-11-14 ENCOUNTER — Telehealth: Payer: Self-pay | Admitting: Physician Assistant

## 2017-11-14 NOTE — Telephone Encounter (Signed)
Pt calling stating that she still has same issues, abd pain and nausea, she stated that she has not taken medication that Amy prescribed because she thinks that she needs a procedure as she has dealt with same issues for years. Pls call her.

## 2017-11-14 NOTE — Telephone Encounter (Signed)
Pt called again regarding this message. °

## 2017-11-14 NOTE — Telephone Encounter (Signed)
States she has been "miserable" and "stomach hurts all over." 1 week of bowel movement within 3 hours of eating, but last night she only went once. Soft to loose bowel movements. Nausea without vomiting. Not on Trulance or Linzess. Confirms Omeprazole daily. Unable to determine if her abdominal pain is upper or lower. Hurts in the mornings. Afebrile. No recent antibiotics.

## 2017-11-15 ENCOUNTER — Other Ambulatory Visit: Payer: Self-pay | Admitting: Family Medicine

## 2017-11-15 DIAGNOSIS — G4700 Insomnia, unspecified: Secondary | ICD-10-CM

## 2017-11-18 ENCOUNTER — Other Ambulatory Visit: Payer: Self-pay

## 2017-11-18 DIAGNOSIS — R1084 Generalized abdominal pain: Secondary | ICD-10-CM

## 2017-11-18 DIAGNOSIS — K5900 Constipation, unspecified: Secondary | ICD-10-CM

## 2017-11-18 NOTE — Telephone Encounter (Signed)
Refill request for Ambien.   Last OV: 09/08/2017 Last Fill: 10/20/2017 #30 and 0RF UDS: 09/30/2016 Low risk

## 2017-11-18 NOTE — Telephone Encounter (Signed)
When I saw her it was for follow up of constipation, but she was acutely ill after waking up that night with nausea, dry heaves etc - thought probable medication rxn .  If she is still having those sxs  Lets get CBC Sed rate , CMET , lipase and schedule for CT abd /pelvis with contrast  As soon as available

## 2017-11-19 NOTE — Telephone Encounter (Signed)
Patient agrees to this plan. CT scheduled. She will come for labs today. [place] CT 11/25/17 at 2:15 pm She will pick up the instructions and contrast today.

## 2017-11-24 ENCOUNTER — Ambulatory Visit: Payer: 59 | Admitting: Family Medicine

## 2017-11-24 ENCOUNTER — Telehealth: Payer: Self-pay | Admitting: Physician Assistant

## 2017-11-24 NOTE — Telephone Encounter (Signed)
Cancelled per patient request. She did not have the labs done either.

## 2017-11-25 ENCOUNTER — Inpatient Hospital Stay: Admission: RE | Admit: 2017-11-25 | Payer: 59 | Source: Ambulatory Visit

## 2017-11-25 DIAGNOSIS — J209 Acute bronchitis, unspecified: Secondary | ICD-10-CM | POA: Diagnosis not present

## 2017-12-02 ENCOUNTER — Emergency Department (HOSPITAL_BASED_OUTPATIENT_CLINIC_OR_DEPARTMENT_OTHER)
Admission: EM | Admit: 2017-12-02 | Discharge: 2017-12-03 | Disposition: A | Discharge status: Home / Self Care | Payer: 59 | Attending: Emergency Medicine | Admitting: Emergency Medicine

## 2017-12-02 ENCOUNTER — Ambulatory Visit: Payer: 59 | Admitting: Nurse Practitioner

## 2017-12-02 ENCOUNTER — Ambulatory Visit (HOSPITAL_BASED_OUTPATIENT_CLINIC_OR_DEPARTMENT_OTHER)
Admission: RE | Admit: 2017-12-02 | Discharge: 2017-12-02 | Disposition: A | Discharge status: Home / Self Care | Payer: 59 | Source: Ambulatory Visit | Attending: Family | Admitting: Family

## 2017-12-02 ENCOUNTER — Ambulatory Visit: Payer: 59 | Admitting: Family

## 2017-12-02 ENCOUNTER — Encounter: Payer: Self-pay | Admitting: Family

## 2017-12-02 ENCOUNTER — Telehealth: Payer: Self-pay | Admitting: Family

## 2017-12-02 VITALS — BP 134/86 | HR 87 | Temp 98.4°F | Resp 18 | Ht 66.0 in | Wt 158.8 lb

## 2017-12-02 DIAGNOSIS — J45909 Unspecified asthma, uncomplicated: Secondary | ICD-10-CM | POA: Diagnosis not present

## 2017-12-02 DIAGNOSIS — R079 Chest pain, unspecified: Secondary | ICD-10-CM | POA: Diagnosis not present

## 2017-12-02 DIAGNOSIS — R059 Cough, unspecified: Secondary | ICD-10-CM

## 2017-12-02 DIAGNOSIS — Y929 Unspecified place or not applicable: Secondary | ICD-10-CM | POA: Insufficient documentation

## 2017-12-02 DIAGNOSIS — R05 Cough: Secondary | ICD-10-CM

## 2017-12-02 DIAGNOSIS — S139XXA Sprain of joints and ligaments of unspecified parts of neck, initial encounter: Secondary | ICD-10-CM

## 2017-12-02 DIAGNOSIS — Y939 Activity, unspecified: Secondary | ICD-10-CM | POA: Diagnosis not present

## 2017-12-02 DIAGNOSIS — J209 Acute bronchitis, unspecified: Secondary | ICD-10-CM

## 2017-12-02 DIAGNOSIS — Z79899 Other long term (current) drug therapy: Secondary | ICD-10-CM | POA: Insufficient documentation

## 2017-12-02 DIAGNOSIS — X58XXXA Exposure to other specified factors, initial encounter: Secondary | ICD-10-CM | POA: Insufficient documentation

## 2017-12-02 DIAGNOSIS — S134XXA Sprain of ligaments of cervical spine, initial encounter: Secondary | ICD-10-CM | POA: Diagnosis not present

## 2017-12-02 DIAGNOSIS — F17210 Nicotine dependence, cigarettes, uncomplicated: Secondary | ICD-10-CM | POA: Insufficient documentation

## 2017-12-02 DIAGNOSIS — J40 Bronchitis, not specified as acute or chronic: Secondary | ICD-10-CM

## 2017-12-02 DIAGNOSIS — Y999 Unspecified external cause status: Secondary | ICD-10-CM | POA: Insufficient documentation

## 2017-12-02 DIAGNOSIS — S199XXA Unspecified injury of neck, initial encounter: Secondary | ICD-10-CM | POA: Diagnosis present

## 2017-12-02 MED ORDER — AZITHROMYCIN 250 MG PO TABS
ORAL_TABLET | ORAL | 0 refills | Status: DC
Start: 2017-12-02 — End: 2017-12-08

## 2017-12-02 MED ORDER — FLUTICASONE-SALMETEROL 500-50 MCG/DOSE IN AEPB: 1.0000 | INHALATION_SPRAY | Freq: Two times a day (BID) | RESPIRATORY_TRACT | 0 refills | Status: DC

## 2017-12-02 MED ORDER — HYDROCOD POLST-CPM POLST ER 10-8 MG/5ML PO SUER: 5.0000 mL | Freq: Two times a day (BID) | ORAL | 0 refills | Status: DC | PRN

## 2017-12-02 NOTE — Patient Instructions (Signed)
Please complete x-ray on the first floor. Start azithromycin for bronchitis. Increase advair to 500 twice daily this month, then you can drop back to .  Call if you develop new/worsening symptoms or if symptoms are not improved in 2-3 days.

## 2017-12-02 NOTE — Telephone Encounter (Signed)
  I reviewed chest x-ray results.  Chest x-ray is negative for pneumonia.  Please began antibiotics and cough medicine as we discussed.  Please advise the patient not to take Ambien on the nights that she uses the Tussionex.

## 2017-12-02 NOTE — Progress Notes (Signed)
Subjective:    Patient ID: Angela Bryant, female    DOB: 18-Jun-1980, 37 y.o.   MRN: [id number]  HPI  Pt presents today with report of body aches Friday/saturday, then aches resolved and she developed a cough. Cough persists, cough hurts.  Cough is dry had trouble sleeping last night.  Yesterday AM she developed recurrent body aches. Also developed nasal drainage. Went to Urgent care Monday last week. Was told that her symptoms were viral. She was treated with prednisone.  Was treated with prednisone 20mg  only due to hx of insomnia on prednisone.  No improvement with these measures or with promethazine cough syrup.  She continues advair bid.    Review of Systems See HPI  Past Medical History:  Diagnosis Date  . Anxiety   . Asthma   . GERD (gastroesophageal reflux disease)   . Insomnia   . Irritable bowel syndrome      Social History   Socioeconomic History  . Marital status: Married    Spouse name: Not on file  . Number of children: 0  . Years of education: Not on file  . Highest education level: Not on file  Occupational History  . 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 Financial resource strain: Not on file  . Food insecurity:    Worry: Not on file    Inability: Not on file  . Transportation needs:    Medical: Not on file    Non-medical: Not on file  Tobacco Use  . Smoking status: Light Tobacco Smoker    Types: Cigarettes  . Smokeless tobacco: Never Used  . Tobacco comment: rarely has a cigarrette, sometimes on weekends  Substance and Sexual Activity  . Alcohol use: Yes    Alcohol/week: 0.0 standard drinks    Comment: occasional   . Drug use: No  . Sexual activity: Yes    Partners: Male  Lifestyle  . Physical activity:    Days per week: Not on file    Minutes per session: Not on file  . Stress: Not on file  Relationships  . Social connections:    Talks on phone: Not on file    Gets together: Not on file    Attends religious  service: Not on file    Active member of club or organization: Not on file    Attends meetings of clubs or organizations: Not on file    Relationship status: Not on file  . Intimate partner violence:    Fear of current or ex partner: Not on file    Emotionally abused: Not on file    Physically abused: Not on file    Forced sexual activity: Not on file  Other Topics Concern  . Not on file  Social History Narrative  . Not on file    Past Surgical History:  Procedure Laterality Date  . ANAL RECTAL MANOMETRY N/A 01/04/2015   Procedure: ANO RECTAL MANOMETRY;  Surgeon: Napoleon Form, MD;  Location: [facility];  Service: Endoscopy;  Laterality: N/A;    Family History  Problem Relation Age of Onset  . Hypertension Father   . Asthma Mother     Allergies  Allergen Reactions  . Codeine Nausea And Vomiting  . Other Other (See Comments)    Ethyl Cyanoacrylate- Positive patch test Paraphenylenediamine- Positive patch test    Current Outpatient Medications on File Prior to Visit  Medication Sig Dispense Refill  . albuterol (PROVENTIL HFA;VENTOLIN HFA)  108 (90 Base) MCG/ACT inhaler Inhale 2 puffs into the lungs every 6 (six) hours as needed. Reported on 02/01/2015 1 Inhaler 2  . cyclobenzaprine (FLEXERIL) 5 MG tablet Take 1 tablet (5 mg total) by mouth 3 (three) times daily as needed for muscle spasms. 30 tablet 1  . ibuprofen (ADVIL,MOTRIN) 800 MG tablet TAKE 1 TABLET(800 MG) BY MOUTH EVERY 8 HOURS AS NEEDED 90 tablet 0  . levonorgestrel (MIRENA) 20 MCG/24HR IUD 1 each by Intrauterine route once.    . linaclotide (LINZESS) 72 MCG capsule Take 1 capsule (72 mcg total) by mouth daily before breakfast. 30 capsule 2  . montelukast (SINGULAIR) 10 MG tablet Take 1 tablet by mouth at bedtime.    Marland Kitchen. omeprazole (PRILOSEC) 40 MG capsule Take 1 capsule (40 mg total) by mouth daily. 90 capsule 3  . Plecanatide (TRULANCE) 3 MG TABS Take 3 mg by mouth daily. 30 tablet 3  . zolpidem (AMBIEN) 10  MG tablet TAKE 1 TABLET(10 MG) BY MOUTH AT BEDTIME 30 tablet 0   No current facility-administered medications on file prior to visit.     BP 134/86 (BP Location: Right Arm, Cuff Size: Normal)   Pulse 87   Temp 98.4 F (36.9 C) (Oral)   Resp 18   Ht 5\' 6"  (1.676 m)   Wt 158 lb 12.8 oz (72 kg)   SpO2 100%   BMI 25.63 kg/m       Objective:   Physical Exam  Constitutional: She appears well-developed and well-nourished.  HENT:  Head: Normocephalic and atraumatic.  Right Ear: Tympanic membrane and ear canal normal.  Left Ear: Tympanic membrane and ear canal normal.  Mouth/Throat: No posterior oropharyngeal edema or posterior oropharyngeal erythema.  Cardiovascular: Normal rate, regular rhythm and normal heart sounds.  No murmur heard. Pulmonary/Chest: Effort normal and breath sounds normal. No respiratory distress. She has no wheezes.  Psychiatric: She has a normal mood and affect. Her behavior is normal. Judgment and thought content normal.          Assessment & Plan:  Bronchitis- we will plan treatment with azithromycin.  Will obtain chest x-ray to rule out pneumonia.  Due to severe nighttime cough symptoms will give trial of Tussionex at bedtime.  She is advised not to take during the day if she needs to drive or work.  She can continue Delsym as needed during the day for Tessalon as needed.  We will increase her Advair from 2 50-500 twice daily.  Advised her after she completes this discus she can return to her 250 dose.  She is advised to call if new or worsening symptoms or if her symptoms are not improved in 2 to 3 days.

## 2017-12-02 NOTE — ED Triage Notes (Addendum)
Pt was seen upstairs today and dx'd with bronchitis, called the nurse hotline later and told them that her neck was hurting and they advised for her to come to the ED for r/o meningitis. Pt has been coughing all evening and her entire upper back along with her neck are sore, able to touch chin to chest and lateral eye movement does not increase pain, no nystagmus, no photophobia. Pt took 500mg  tylenol at 2230.

## 2017-12-02 NOTE — Telephone Encounter (Signed)
Notified pt and she voices understanding. 

## 2017-12-03 ENCOUNTER — Other Ambulatory Visit: Payer: Self-pay

## 2017-12-03 ENCOUNTER — Encounter (HOSPITAL_BASED_OUTPATIENT_CLINIC_OR_DEPARTMENT_OTHER): Payer: Self-pay

## 2017-12-03 MED ORDER — PREDNISONE 10 MG (21) PO TBPK: ORAL_TABLET | ORAL | 0 refills | Status: DC

## 2017-12-03 MED ORDER — CYCLOBENZAPRINE HCL 5 MG PO TABS: 5.0000 mg | ORAL_TABLET | Freq: Three times a day (TID) | ORAL | 0 refills | Status: DC | PRN

## 2017-12-03 MED ORDER — NAPROXEN 375 MG PO TABS: 375.0000 mg | ORAL_TABLET | Freq: Two times a day (BID) | ORAL | 0 refills | Status: AC | PRN

## 2017-12-03 NOTE — Discharge Instructions (Addendum)
As we discussed, I believe your symptoms today are due to a sprain of your neck.  This is probably due to your bronchitis.  I agree with taking azithromycin.  For your neck sprain, have also prescribed an anti-inflammatory and muscle relaxant.  The muscle relaxant can help with sleep as well.  Symptoms of meningitis would include fevers, severe unrelenting headache, pain with exposure to bright lights are loud noises, and central neck stiffness that in particular is worse with bringing your chin to your chest.

## 2017-12-03 NOTE — ED Provider Notes (Signed)
[facility] EMERGENCY DEPARTMENT Provider Note   CSN: [id number] Arrival date & time: 12/02/17  2333     History   Chief Complaint Chief Complaint  Patient presents with  . Neck Pain    HPI Angela Bryant is a 37 y.o. female.  HPI 37 year old female here with neck pain.  The patient was recently seen by urgent care and diagnosed with likely bronchitis.  She was placed on azithromycin.  She has been coughing for the last week, and over the last several days, has noted some aching, throbbing, paraspinal neck pain is worse when she turns her head left and right.  She denies any headache, photophobia, or phonophobia.  She is not having fevers.  It is made worse after coughing.  No specific worsening with bright lights or loud noises or flexing her neck.  She called the nurse helpline today and told him about her neck and she was told to come to the ER for evaluation of meningitis.  She denies any worsening symptoms.  No rash.  She is vaccinated.  No other complaints.  She took her first dose of azithromycin today.  Past Medical History:  Diagnosis Date  . Anxiety   . Asthma   . GERD (gastroesophageal reflux disease)   . Insomnia   . Irritable bowel syndrome     Patient Active Problem List   Diagnosis Date Noted  . Anxiety 09/08/2017  . Chronic idiopathic constipation 06/10/2013  . ALLERGIC RHINITIS 04/14/2006  . ASTHMA 04/14/2006  . GERD 04/14/2006    Past Surgical History:  Procedure Laterality Date  . ANAL RECTAL MANOMETRY N/A 01/04/2015   Procedure: ANO RECTAL MANOMETRY;  Surgeon: Napoleon FormKavitha Nandigam V, MD;  Location: [facility];  Service: Endoscopy;  Laterality: N/A;     OB History   None      Home Medications    Prior to Admission medications   Medication Sig Start Date End Date Taking? Authorizing Provider  albuterol (PROVENTIL HFA;VENTOLIN HFA) 108 (90 Base) MCG/ACT inhaler Inhale 2 puffs into the lungs every 6 (six) hours as needed. Reported on  02/01/2015 12/10/16   Seabron SpatesLowne Chase, Yvonne R, DO  azithromycin (ZITHROMAX) 250 MG tablet 2 tabs by mouth today, then one tab once daily for 4 more days. 12/02/17   Sandford Craze'Sullivan, Melissa, NP  chlorpheniramine-HYDROcodone (TUSSIONEX PENNKINETIC ER) 10-8 MG/5ML SUER Take 5 mLs by mouth every 12 (twelve) hours as needed for cough. 12/02/17   Sandford Craze'Sullivan, Melissa, NP  cyclobenzaprine (FLEXERIL) 5 MG tablet Take 1 tablet (5 mg total) by mouth 3 (three) times daily as needed for muscle spasms. 12/03/17   Shaune PollackIsaacs, Zaron Zwiefelhofer, MD  Fluticasone-Salmeterol (ADVAIR DISKUS) 500-50 MCG/DOSE AEPB Inhale 1 puff into the lungs 2 (two) times daily. 12/02/17   Sandford Craze'Sullivan, Melissa, NP  ibuprofen (ADVIL,MOTRIN) 800 MG tablet TAKE 1 TABLET(800 MG) BY MOUTH EVERY 8 HOURS AS NEEDED 02/14/17   Donato SchultzLowne Chase, Yvonne R, DO  levonorgestrel (MIRENA) 20 MCG/24HR IUD 1 each by Intrauterine route once.    [provider]  linaclotide Karlene Einstein(LINZESS) 72 MCG capsule Take 1 capsule (72 mcg total) by mouth daily before breakfast. 10/29/17   Esterwood, Amy S, PA-C  montelukast (SINGULAIR) 10 MG tablet Take 1 tablet by mouth at bedtime. 05/24/13   [provider]  naproxen (NAPROSYN) 375 MG tablet Take 1 tablet (375 mg total) by mouth 2 (two) times daily as needed for up to 7 days for moderate pain. 12/03/17 12/10/17  Shaune PollackIsaacs, Mckale Haffey, MD  omeprazole (PRILOSEC) 40  MG capsule Take 1 capsule (40 mg total) by mouth daily. 09/08/17   Zola Button, Grayling Congress, DO  Plecanatide (TRULANCE) 3 MG TABS Take 3 mg by mouth daily. 01/08/17   Meredith Pel, NP  predniSONE (STERAPRED UNI-PAK 21 TAB) 10 MG (21) TBPK tablet Take as directed on package for taper. 12/03/17   Shaune Pollack, MD  zolpidem (AMBIEN) 10 MG tablet TAKE 1 TABLET(10 MG) BY MOUTH AT BEDTIME 11/18/17   Donato Schultz, DO    Family History Family History  Problem Relation Age of Onset  . Hypertension Father   . Asthma Mother     Social History Social History   Tobacco Use   . Smoking status: Light Tobacco Smoker    Types: Cigarettes  . Smokeless tobacco: Never Used  . Tobacco comment: rarely has a cigarrette, sometimes on weekends  Substance Use Topics  . Alcohol use: Yes    Alcohol/week: 0.0 standard drinks    Comment: occasional   . Drug use: No     Allergies   Codeine and Other   Review of Systems Review of Systems  Constitutional: Positive for fatigue. Negative for chills and fever.  HENT: Positive for congestion and rhinorrhea.   Eyes: Negative for visual disturbance.  Respiratory: Positive for cough. Negative for shortness of breath and wheezing.   Cardiovascular: Negative for chest pain and leg swelling.  Gastrointestinal: Negative for abdominal pain, diarrhea, nausea and vomiting.  Genitourinary: Negative for dysuria and flank pain.  Musculoskeletal: Positive for neck pain. Negative for neck stiffness.  Skin: Negative for rash and wound.  Allergic/Immunologic: Negative for immunocompromised state.  Neurological: Negative for syncope, weakness and headaches.  All other systems reviewed and are negative.    Physical Exam Updated Vital Signs BP (!) 165/100 (BP Location: Left Arm)   Pulse 98   Temp 98.6 F (37 C) (Oral)   Resp 18   Ht 5\' 6"  (1.676 m)   Wt 71.7 kg   SpO2 100%   BMI 25.50 kg/m   Physical Exam  Constitutional: She is oriented to person, place, and time. She appears well-developed and well-nourished. No distress.  Very well-appearing, smiling, no distress  HENT:  Head: Normocephalic and atraumatic.  Mouth/Throat: Oropharynx is clear and moist.  Mild posterior pharyngeal erythema.  No tonsillar swelling or exudates.  Serous effusions bilateral tympanic membranes.  Eyes: Conjunctivae are normal.  Neck: Neck supple.    Likely supple.  There is mild tender anterior cervical lymphadenopathy.  No stiffness and passive and active range of motion is full and painless.  Negative Kernig's and Brudzinski's.  Mild  tenderness over bilateral paraspinal cervical muscles.  No midline tenderness.  No rigidity.  Cardiovascular: Normal rate, regular rhythm and normal heart sounds. Exam reveals no friction rub.  No murmur heard. Pulmonary/Chest: Effort normal and breath sounds normal. No respiratory distress. She has no wheezes. She has no rales.  Abdominal: She exhibits no distension.  Musculoskeletal: She exhibits no edema.  Neurological: She is alert and oriented to person, place, and time. She has normal strength. No cranial nerve deficit or sensory deficit. She exhibits normal muscle tone. GCS eye subscore is 4. GCS verbal subscore is 5. GCS motor subscore is 6.  Skin: Skin is warm. Capillary refill takes less than 2 seconds.  Psychiatric: She has a normal mood and affect.  Nursing note and vitals reviewed.    ED Treatments / Results  Labs (all labs ordered are listed, but only abnormal results  are displayed) Labs Reviewed - No data to display  EKG None  Radiology Dg Chest 2 View  Result Date: 12/02/2017 CLINICAL DATA:  Cough and chest pain EXAM: CHEST - 2 VIEW COMPARISON:  December 12, 2016 FINDINGS: No edema or consolidation. Heart size and pulmonary vascularity are normal. No adenopathy. No pneumothorax. No bone lesions. IMPRESSION: No edema or consolidation. Electronically Signed   By: Bretta Bang III M.D.   On: 12/02/2017 16:24    Procedures Procedures (including critical care time)  Medications Ordered in ED Medications - No data to display   Initial Impression / Assessment and Plan / ED Course  I have reviewed the triage vital signs and the nursing notes.  Pertinent labs & imaging results that were available during my care of the patient were reviewed by me and considered in my medical decision making (see chart for details).     Very well-appearing 37 year old female here with neck pain.  Sent here from urgent care helpline due to reported neck pain.  On my exam, this is  more consistent with likely musculoskeletal neck pain due to frequent coughing from likely viral bronchitis.  She has a history of asthma as well, which is why I suspect her symptoms are not significantly improving.  She has absolutely no headache, fever, neck stiffness, photophobia, phonophobia, and is well-appearing with symptoms greater than 4 to 5 days, and I do not suspect viral or bacterial meningitis at this time.  This was discussed in detail with her and she was given very strict return precautions.  Will place her on a steroid taper for her asthmatic symptoms, give analgesics and muscle relaxants for her likely musculoskeletal neck pain, and discharged home.  Final Clinical Impressions(s) / ED Diagnoses   Final diagnoses:  Acute bronchitis, unspecified organism  Neck sprain, initial encounter    ED Discharge Orders         Ordered    cyclobenzaprine (FLEXERIL) 5 MG tablet  3 times daily PRN     12/03/17 0146    predniSONE (STERAPRED UNI-PAK 21 TAB) 10 MG (21) TBPK tablet     12/03/17 0146    naproxen (NAPROSYN) 375 MG tablet  2 times daily PRN     12/03/17 0146           Shaune Pollack, MD 12/03/17 [phone]

## 2017-12-04 ENCOUNTER — Telehealth: Payer: Self-pay | Admitting: *Deleted

## 2017-12-04 NOTE — Telephone Encounter (Signed)
Copied from CRM [id number]. Topic: General - Other >> Dec 03, 2017 12:18 PM Bryant, Angela R wrote: Pt requesting a work note sent through [employer].  Please advise.   [phone]

## 2017-12-07 ENCOUNTER — Encounter: Payer: Self-pay | Admitting: Family

## 2017-12-08 ENCOUNTER — Telehealth: Payer: Self-pay | Admitting: *Deleted

## 2017-12-08 ENCOUNTER — Encounter: Payer: Self-pay | Admitting: Family

## 2017-12-08 ENCOUNTER — Ambulatory Visit: Payer: 59 | Admitting: Family

## 2017-12-08 VITALS — BP 129/82 | HR 97 | Temp 98.8°F | Resp 16 | Ht 66.0 in | Wt 161.0 lb

## 2017-12-08 DIAGNOSIS — J40 Bronchitis, not specified as acute or chronic: Secondary | ICD-10-CM

## 2017-12-08 DIAGNOSIS — M791 Myalgia, unspecified site: Secondary | ICD-10-CM | POA: Diagnosis not present

## 2017-12-08 NOTE — Telephone Encounter (Signed)
Author phoned pt. To set up OV for today. Appointment made for 115PM today, as pt. "needs to get to work at 2". Routed to Victoria Veramelissa as FYI.

## 2017-12-08 NOTE — Telephone Encounter (Signed)
Copied from CRM [id number]. Topic: General - Other >> Dec 08, 2017  7:59 AM Gerrianne ScalePayne, Angela L wrote: Reason for CRM: pt calling about message she sent through mychart she now has a sore throat and feel like a lump is in her throat and want to know what she should do or if she need to come back into clinic please give pt a call back as soon as possible at [phone]

## 2017-12-08 NOTE — Telephone Encounter (Signed)
Let's bring her back in please.

## 2017-12-08 NOTE — Patient Instructions (Signed)
Please continue tylenol as needed for pain. Call if symptoms worsen or if not improved in 2-3 days.

## 2017-12-08 NOTE — Progress Notes (Signed)
Subjective:    Patient ID: Angela PoundStephanie F Reaser, female    DOB: 12/03/1980, 37 y.o.   MRN: [id number]  HPI  Patient is a 37 year old female who presents today for follow-up.  I saw her on December 02, 2017.  At that time she described a persistent cough.  She had been seen prior to this visit by urgent care where she had been prescribed a low-dose of prednisone.  Cough did not improve.  We prescribed a azithromycin as well as Tussionex to use at bedtime.  A chest x-ray was performed which noted clear lungs without infiltrate.  Her Advair strength was increased.  Later that day she presented to the emergency department due to to some aching/throbbing and worsening neck pain.  ER provider felt that her symptoms were most likely due to neck pain from frequent coughing in the setting of viral bronchitis.   Reports ongoing "achiness."  Feels OK in the AM and "I just get tired and achey."  Throat feels irritated.  Last night throat felt dry and irritated. Reports that cough is getting better.  Reports achiness every where but "nowhere near as bad as it was. Reports that the first day that she had symptoms was 11/9.    Review of Systems See HPI  Past Medical History:  Diagnosis Date  . Anxiety   . Asthma   . GERD (gastroesophageal reflux disease)   . Insomnia   . Irritable bowel syndrome      Social History   Socioeconomic History  . Marital status: Married    Spouse name: Not on file  . Number of children: 0  . Years of education: Not on file  . Highest education level: Not on file  Occupational History  . Occupation: [employer]    Employer: [employer]  Social Needs  . Financial resource strain: Not on file  . Food insecurity:    Worry: Not on file    Inability: Not on file  . Transportation needs:    Medical: Not on file    Non-medical: Not on file  Tobacco Use  . Smoking status: Light Tobacco Smoker    Types: Cigarettes  . Smokeless tobacco: Never Used  . Tobacco comment:  rarely has a cigarrette, sometimes on weekends  Substance and Sexual Activity  . Alcohol use: Yes    Alcohol/week: 0.0 standard drinks    Comment: occasional   . Drug use: No  . Sexual activity: Yes    Partners: Male  Lifestyle  . Physical activity:    Days per week: Not on file    Minutes per session: Not on file  . Stress: Not on file  Relationships  . Social connections:    Talks on phone: Not on file    Gets together: Not on file    Attends religious service: Not on file    Active member of club or organization: Not on file    Attends meetings of clubs or organizations: Not on file    Relationship status: Not on file  . Intimate partner violence:    Fear of current or ex partner: Not on file    Emotionally abused: Not on file    Physically abused: Not on file    Forced sexual activity: Not on file  Other Topics Concern  . Not on file  Social History Narrative  . Not on file    Past Surgical History:  Procedure Laterality Date  . ANAL RECTAL MANOMETRY N/A 01/04/2015  Procedure: ANO RECTAL MANOMETRY;  Surgeon: Napoleon Form, MD;  Location: [facility];  Service: Endoscopy;  Laterality: N/A;    Family History  Problem Relation Age of Onset  . Hypertension Father   . Asthma Mother     Allergies  Allergen Reactions  . Codeine Nausea And Vomiting  . Other Other (See Comments)    Ethyl Cyanoacrylate- Positive patch test Paraphenylenediamine- Positive patch test    Current Outpatient Medications on File Prior to Visit  Medication Sig Dispense Refill  . albuterol (PROVENTIL HFA;VENTOLIN HFA) 108 (90 Base) MCG/ACT inhaler Inhale 2 puffs into the lungs every 6 (six) hours as needed. Reported on 02/01/2015 1 Inhaler 2  . chlorpheniramine-HYDROcodone (TUSSIONEX PENNKINETIC ER) 10-8 MG/5ML SUER Take 5 mLs by mouth every 12 (twelve) hours as needed for cough. 50 mL 0  . cyclobenzaprine (FLEXERIL) 5 MG tablet Take 1 tablet (5 mg total) by mouth 3 (three) times daily  as needed for muscle spasms. 21 tablet 0  . Fluticasone-Salmeterol (ADVAIR DISKUS) 500-50 MCG/DOSE AEPB Inhale 1 puff into the lungs 2 (two) times daily. 60 each 0  . ibuprofen (ADVIL,MOTRIN) 800 MG tablet TAKE 1 TABLET(800 MG) BY MOUTH EVERY 8 HOURS AS NEEDED 90 tablet 0  . levonorgestrel (MIRENA) 20 MCG/24HR IUD 1 each by Intrauterine route once.    . linaclotide (LINZESS) 72 MCG capsule Take 1 capsule (72 mcg total) by mouth daily before breakfast. 30 capsule 2  . montelukast (SINGULAIR) 10 MG tablet Take 1 tablet by mouth at bedtime.    . naproxen (NAPROSYN) 375 MG tablet Take 1 tablet (375 mg total) by mouth 2 (two) times daily as needed for up to 7 days for moderate pain. 14 tablet 0  . omeprazole (PRILOSEC) 40 MG capsule Take 1 capsule (40 mg total) by mouth daily. 90 capsule 3  . Plecanatide (TRULANCE) 3 MG TABS Take 3 mg by mouth daily. 30 tablet 3  . zolpidem (AMBIEN) 10 MG tablet TAKE 1 TABLET(10 MG) BY MOUTH AT BEDTIME 30 tablet 0   No current facility-administered medications on file prior to visit.     BP 129/82 (BP Location: Right Arm, Patient Position: Sitting, Cuff Size: Small)   Pulse 97   Temp 98.8 F (37.1 C) (Oral)   Resp 16   Ht 5\' 6"  (1.676 m)   Wt 161 lb (73 kg)   SpO2 99%   BMI 25.99 kg/m       Objective:   Physical Exam  Constitutional: She is oriented to person, place, and time. She appears well-developed and well-nourished.  HENT:  Head: Normocephalic and atraumatic.  Right Ear: Tympanic membrane and ear canal normal.  Left Ear: Tympanic membrane and ear canal normal.  Mouth/Throat: Uvula is midline and oropharynx is clear and moist. No oropharyngeal exudate, posterior oropharyngeal edema, posterior oropharyngeal erythema or tonsillar abscesses.  Neck: Neck supple.  Cardiovascular: Normal rate, regular rhythm and normal heart sounds.  No murmur heard. Pulmonary/Chest: Effort normal and breath sounds normal. No respiratory distress. She has no wheezes.   Lymphadenopathy:    She has no cervical adenopathy.  Neurological: She is alert and oriented to person, place, and time.  Skin: Skin is warm and dry.  Psychiatric: She has a normal mood and affect. Her behavior is normal. Judgment and thought content normal.          Assessment & Plan:  Bronchitis-symptoms most consistent with resolving bronchitis.  I feel that her neck pain is likely musculoskeletal.  Throat  discomfort I think is related to coughing.  She is overall improving.  I have suggested ongoing supportive measures.  Continue Tylenol as needed, hydration, and call if symptoms do not continue to improve.  Patient verbalizes understanding.

## 2017-12-09 ENCOUNTER — Ambulatory Visit: Payer: Self-pay | Admitting: *Deleted

## 2017-12-09 ENCOUNTER — Ambulatory Visit: Payer: 59 | Admitting: Family

## 2017-12-09 LAB — CBC WITH DIFFERENTIAL/PLATELET
Basophils Absolute: 0 10*3/uL (ref 0.0–0.1)
Basophils Relative: 0.5 % (ref 0.0–3.0)
EOS PCT: 0.9 % (ref 0.0–5.0)
Eosinophils Absolute: 0.1 10*3/uL (ref 0.0–0.7)
HEMATOCRIT: 37.7 % (ref 36.0–46.0)
HEMOGLOBIN: 12.8 g/dL (ref 12.0–15.0)
LYMPHS ABS: 2.4 10*3/uL (ref 0.7–4.0)
LYMPHS PCT: 25.4 % (ref 12.0–46.0)
MCHC: 33.9 g/dL (ref 30.0–36.0)
MCV: 92.1 fl (ref 78.0–100.0)
MONOS PCT: 6.6 % (ref 3.0–12.0)
Monocytes Absolute: 0.6 10*3/uL (ref 0.1–1.0)
NEUTROS PCT: 66.6 % (ref 43.0–77.0)
Neutro Abs: 6.4 10*3/uL (ref 1.4–7.7)
Platelets: 207 10*3/uL (ref 150.0–400.0)
RBC: 4.09 Mil/uL (ref 3.87–5.11)
RDW: 13.6 % (ref 11.5–15.5)
WBC: 9.7 10*3/uL (ref 4.0–10.5)

## 2017-12-09 NOTE — Telephone Encounter (Signed)
Patient called to discuss ear pain. She says she went to a dentist and says he thinks it's a nerve from a tooth that's causing pain, so he prescribed an antibiotic. I advised if she has any other concerns to call the office back.

## 2017-12-09 NOTE — Telephone Encounter (Signed)
Summary: ear pain after exam   Pt calling to speak with a nurse. States she was seen yesterday for still being sick. Pt remembers doctor looking in her ears, but about 2 hours after her visit she has had ear pain from ear into left jaw. Pt wants to know if this can be from the exam or what she can do for the pain.

## 2017-12-15 ENCOUNTER — Telehealth: Payer: Self-pay | Admitting: Family Medicine

## 2017-12-15 DIAGNOSIS — Z79899 Other long term (current) drug therapy: Secondary | ICD-10-CM

## 2017-12-15 DIAGNOSIS — G4700 Insomnia, unspecified: Secondary | ICD-10-CM

## 2017-12-16 ENCOUNTER — Telehealth: Payer: Self-pay | Admitting: Family Medicine

## 2017-12-16 DIAGNOSIS — G4700 Insomnia, unspecified: Secondary | ICD-10-CM

## 2017-12-16 NOTE — Telephone Encounter (Signed)
Database ran on 09/24/17 and is in media for review   Last written: 11/18/17 Last ov: 09/08/17 Next ov: none Contract: due UDS: due  Not sure why we did not collect at 09/08/17 visit.  Will let patient know that we need.

## 2017-12-17 ENCOUNTER — Encounter: Payer: Self-pay | Admitting: *Deleted

## 2017-12-17 NOTE — Telephone Encounter (Signed)
Pt calling to advise [facility] never got the Rx for her zolpidem (AMBIEN) 10 MG tablet  Pt states they got a cover page that said it was coming, but nothing else. Pt states she needs to pick up today.   [id number] [place] - [address] AT [facility] [address] [phone] (Phone) [phone] (Fax)

## 2017-12-17 NOTE — Addendum Note (Signed)
Addended by: Thelma BargeICHARDSON, Rekha Hobbins D on: 12/17/2017 11:56 AM   Modules accepted: Orders

## 2017-12-17 NOTE — Telephone Encounter (Signed)
Patient notified that she must come in and do UDS and sign contract.  Contract will be at front desk for pickup and future order placed for UDS

## 2017-12-19 NOTE — Telephone Encounter (Signed)
Sent!

## 2017-12-19 NOTE — Telephone Encounter (Signed)
Requesting:ambien  Contract:yes ZOX:WRUEADS:needs one  Last OV:12/08/17 Next OV:not scheduled  Last Refill:07/18/17  #[id number] Database:   Please advise

## 2017-12-28 ENCOUNTER — Other Ambulatory Visit: Payer: Self-pay | Admitting: Family

## 2018-02-04 ENCOUNTER — Ambulatory Visit: Payer: 59 | Admitting: Nurse Practitioner

## 2018-02-04 ENCOUNTER — Ambulatory Visit: Payer: Self-pay

## 2018-02-04 ENCOUNTER — Ambulatory Visit: Payer: 59 | Admitting: Family Medicine

## 2018-02-04 ENCOUNTER — Encounter: Payer: Self-pay | Admitting: Family Medicine

## 2018-02-04 VITALS — BP 122/80 | HR 82 | Temp 98.2°F | Resp 16 | Ht 66.0 in | Wt 158.0 lb

## 2018-02-04 DIAGNOSIS — R0981 Nasal congestion: Secondary | ICD-10-CM | POA: Diagnosis not present

## 2018-02-04 DIAGNOSIS — R197 Diarrhea, unspecified: Secondary | ICD-10-CM | POA: Diagnosis not present

## 2018-02-04 DIAGNOSIS — R067 Sneezing: Secondary | ICD-10-CM

## 2018-02-04 DIAGNOSIS — R112 Nausea with vomiting, unspecified: Secondary | ICD-10-CM

## 2018-02-04 NOTE — Patient Instructions (Signed)
Try some ibuprofen or aleve for your headache, also some gatorade for dehydration Please let us know if your vomiting or diarrhea return, or if your headache does not continue to resolve Try an OTC nasal decongestant spray such as afrin as needed for nasal congestion Your might try some mucinex if you have sinus congestion/ pressure- it will thin any mucus so you can blow it out more easily

## 2018-02-04 NOTE — Telephone Encounter (Signed)
Pt called with c/o continued headache 45 minutes after eating dinner last night. At that time the headache was severe and sudden onset. Pt stated she took 3 extra strength Tylenol and pt stated that it was ineffective.  Pt stated that she ate sauerkraut and kielbasa and 45 minutes later she suddenly developed nausea, vomiting and severe headache. Pt stated that she hoped by going to sleep, her headache would subside. Pt stated she woke up this morning and her headache is a 5 on scale of 1-10. Pt took Extra strength tylenol this morning. Pt stated that she is no longer having nausea or vomiting.  Pt stated Monday she had dizziness that lasted for a minute, then went away. Pt doesn't have h/o migraines. Pt stated that her pain is at her temples and over left eye.  Pt offered several appointments but needed a late appt. No available appointments with PCP or anyone else at [facility]. Pt asked if she could be seen at the [place] location. Pt given appt with Alysia Penna NP tomorrow at 4:15 pm.  Reason for Disposition . [1] MODERATE headache (e.g., interferes with normal activities) AND [2] present > 24 hours AND [3] unexplained  (Exceptions: analgesics not tried, typical migraine, or headache part of viral illness)  Answer Assessment - Initial Assessment Questions 1. LOCATION: "Where does it hurt?"     Temples and over left eye also 2. ONSET: "When did the headache start?" (Minutes, hours or days)     yesterday 3. PATTERN: "Does the pain come and go, or has it been constant since it started?"     45 minutes after pt ate sauerkraut and kielbasa 4. SEVERITY: "How bad is the pain?" and "What does it keep you from doing?"  (e.g., Scale 1-10; mild, moderate, or severe)   - MILD (1-3): doesn't interfere with normal activities    - MODERATE (4-7): interferes with normal activities or awakens from sleep    - SEVERE (8-10): excruciating pain, unable to do any normal activities        Moderate 5/10 5.  RECURRENT SYMPTOM: "Have you ever had headaches before?" If so, ask: "When was the last time?" and "What happened that time?"      Yes after 2 drinks Saturday  6. CAUSE: "What do you think is causing the headache?"     suerkraut 7. MIGRAINE: "Have you been diagnosed with migraine headaches?" If so, ask: "Is this headache similar?"      No-no 8. HEAD INJURY: "Has there been any recent injury to the head?"      no 9. OTHER SYMPTOMS: "Do you have any other symptoms?" (fever, stiff neck, eye pain, sore throat, cold symptoms)     no 10. PREGNANCY: "Is there any chance you are pregnant?" "When was your last menstrual period?"       No- IUD LMP: now  Protocols used: HEADACHE-A-AH

## 2018-02-04 NOTE — Progress Notes (Signed)
[facility] at [facility] [address] [place], [place] [address] [phone]  Date:  02/04/2018   Name:  Angela Bryant   DOB:  01/27/1980   MRN:  [id number]  PCP:  Donato Schultz, DO    Chief Complaint: No chief complaint on file.   History of Present Illness:  Angela Bryant is a 38 y.o. very pleasant female patient who presents with the following:  Patient with history of asthma and allergies.  Her PCP is Dr. Laury Axon, I did see her about a year ago for a rash  Here today with concern of GI illness She made a home cooked meal yesterday-she cooked sauerkraut and sausage in the crockpot.  This was an unusual meal for her, something her husband had requested.  About 45 minutes after eating she developed diarrhea, vomiting, and headache.  She states she vomited about 5 times, and had approximately 7 bouts of diarrhea.  She also had a very bad headache. Her GI sx resolved as of 10 pm last night   She had called in the [facility]-  Pt called with c/o continued headache 45 minutes after eating dinner last night. At that time the headache was severe and sudden onset. Pt stated she took 3 extra strength Tylenol and pt stated that it was ineffective.  Pt stated that she ate sauerkraut and kielbasa and 45 minutes later she suddenly developed nausea, vomiting and severe headache. Pt stated that she hoped by going to sleep, her headache would subside. Pt stated she woke up this morning and her headache is a 5 on scale of 1-10. Pt took Extra strength tylenol this morning. Pt stated that she is no longer having nausea or vomiting.  Pt stated Monday she had dizziness that lasted for a minute, then went away. Pt doesn't have h/o migraines. Pt stated that her pain is at her temples and over left eye.   No recent travel, camping, or other suspicious foods No one else who ate the meal got sick  Today she feels better- HA is better but not yet  resolved, she took tylenol last night and at 9am today. No fever noted  She did not see any blood in her vomit or diarrhea  LMP is current, she has a mirena IUD  She also just today notes symptoms of nasal congestion and sneezing, wonders if she is getting a cold No cough, throat is a bit sore from vomiting yesterday  Patient Active Problem List   Diagnosis Date Noted  . Anxiety 09/08/2017  . Chronic idiopathic constipation 06/10/2013  . ALLERGIC RHINITIS 04/14/2006  . ASTHMA 04/14/2006  . GERD 04/14/2006    Past Medical History:  Diagnosis Date  . Anxiety   . Asthma   . GERD (gastroesophageal reflux disease)   . Insomnia   . Irritable bowel syndrome     Past Surgical History:  Procedure Laterality Date  . ANAL RECTAL MANOMETRY N/A 01/04/2015   Procedure: ANO RECTAL MANOMETRY;  Surgeon: Napoleon Form, MD;  Location: [facility];  Service: Endoscopy;  Laterality: N/A;    Social History   Tobacco Use  . Smoking status: Light Tobacco Smoker    Types: Cigarettes  . Smokeless tobacco: Never Used  . Tobacco comment: rarely has a cigarrette, sometimes on weekends  Substance Use Topics  . Alcohol use: Yes    Alcohol/week: 0.0 standard drinks    Comment: occasional   .  Drug use: No    Family History  Problem Relation Age of Onset  . Hypertension Father   . Asthma Mother     Allergies  Allergen Reactions  . Codeine Nausea And Vomiting  . Other Other (See Comments)    Ethyl Cyanoacrylate- Positive patch test Paraphenylenediamine- Positive patch test    Medication list has been reviewed and updated.  Current Outpatient Medications on File Prior to Visit  Medication Sig Dispense Refill  . ADVAIR DISKUS 500-50 MCG/DOSE AEPB INHALE 1 PUFF INTO THE LUNGS TWICE DAILY 60 each 2  . albuterol (PROVENTIL HFA;VENTOLIN HFA) 108 (90 Base) MCG/ACT inhaler Inhale 2 puffs into the lungs every 6 (six) hours as needed. Reported on 02/01/2015 1 Inhaler 2  . cyclobenzaprine  (FLEXERIL) 5 MG tablet Take 1 tablet (5 mg total) by mouth 3 (three) times daily as needed for muscle spasms. 21 tablet 0  . ibuprofen (ADVIL,MOTRIN) 800 MG tablet TAKE 1 TABLET(800 MG) BY MOUTH EVERY 8 HOURS AS NEEDED 90 tablet 0  . levonorgestrel (MIRENA) 20 MCG/24HR IUD 1 each by Intrauterine route once.    . linaclotide (LINZESS) 72 MCG capsule Take 1 capsule (72 mcg total) by mouth daily before breakfast. 30 capsule 2  . montelukast (SINGULAIR) 10 MG tablet Take 1 tablet by mouth at bedtime.    Marland Kitchen omeprazole (PRILOSEC) 40 MG capsule Take 1 capsule (40 mg total) by mouth daily. 90 capsule 3  . zolpidem (AMBIEN) 10 MG tablet TAKE 1 TABLET(10 MG) BY MOUTH AT BEDTIME 30 tablet 0   No current facility-administered medications on file prior to visit.     Review of Systems:  As per HPI- otherwise negative. No fever  Physical Examination: Vitals:   02/04/18 1325  BP: 122/80  Pulse: 82  Resp: 16  Temp: 98.2 F (36.8 C)  SpO2: 98%   Vitals:   02/04/18 1325  Weight: 158 lb (71.7 kg)  Height: 5\' 6"  (1.676 m)   Body mass index is 25.5 kg/m. Ideal Body Weight: Weight in (lb) to have BMI = 25: 154.6  GEN: WDWN, NAD, Non-toxic, A & O x 3, normal weight, looks well HEENT: Atraumatic, Normocephalic. Neck supple. No masses, No LAD.  Bilateral TM wnl, oropharynx normal.  PEERL,EOMI.   No meningismus Ears and Nose: No external deformity. CV: RRR, No M/G/R. No JVD. No thrill. No extra heart sounds. PULM: CTA B, no wheezes, crackles, rhonchi. No retractions. No resp. distress. No accessory muscle use. ABD: S, NT, ND, +BS. No rebound. No HSM.  Belly is benign EXTR: No c/c/e NEURO Normal gait.  PSYCH: Normally interactive. Conversant. Not depressed or anxious appearing.  Calm demeanor.  Normal strength, sensation, DTR of all extremities.  Normal rapid alternating motion of hands.  Negative Romberg.  Normal strength and sensation of facial muscles  Assessment and Plan: Non-intractable  vomiting with nausea, unspecified vomiting type  Diarrhea, unspecified type  Sneezing  Nasal congestion  Here today following a bout of diarrhea, vomiting, and headache last night.  Her symptoms began shortly after eating an unusual meal which included sauerkraut.  GI symptoms are now resolved, headache is better.  Suspect remaining headache may be due to dehydration.  Encouraged hydration, and OTC medications as needed for remaining headache.  She also notes nasal congestion and sneezing on the way to clinic today.  Suggested a nasal decongestant spray, and Mucinex as needed.  Explained to patient that we suspect her symptoms last night were due to a food intolerance or  other reaction. It is also possible that she had a GI virus.  Suspect her headache was part of the GI syndrome.  She is cautioned that if she develops a severe headache again however she should seek medical care.  Signed Abbe AmsterdamJessica Pawel Soules, MD

## 2018-02-09 ENCOUNTER — Other Ambulatory Visit: Payer: Self-pay | Admitting: Family Medicine

## 2018-02-09 DIAGNOSIS — G4700 Insomnia, unspecified: Secondary | ICD-10-CM

## 2018-02-10 NOTE — Telephone Encounter (Signed)
Database ran and is on your desk for review.  Last filled per database: 01/15/18 Last written: 12/19/17 Last ov: 08/19/17 Next ov: none Contract: due UDS: due

## 2018-02-20 DIAGNOSIS — L2089 Other atopic dermatitis: Secondary | ICD-10-CM | POA: Diagnosis not present

## 2018-02-20 DIAGNOSIS — L235 Allergic contact dermatitis due to other chemical products: Secondary | ICD-10-CM | POA: Diagnosis not present

## 2018-03-14 ENCOUNTER — Telehealth: Payer: 59 | Admitting: Physician Assistant

## 2018-03-14 DIAGNOSIS — R0602 Shortness of breath: Secondary | ICD-10-CM

## 2018-03-14 NOTE — Progress Notes (Signed)
Based on what you shared with me, I feel your condition warrants further evaluation and I recommend that you be seen for a face to face office visit. Giving medical history ans shortness of breath you need a good lung examination and assessment do the proper treatments are given.   NOTE: If you entered your credit card information for this eVisit, you will not be charged. You may see a "hold" on your card for the $30 but that hold will drop off and you will not have a charge processed.  If you are having a true medical emergency please call 911.  If you need an urgent face to face visit, [place] has four urgent care centers for your convenience.  If you need care fast and have a high deductible or no insurance consider:   [email or web address] to reserve your spot online an avoid wait times  [facility] [address], [address] [place], [place] [address] 8 am to 8 pm Monday-Friday 10 am to 4 pm Saturday-Sunday *Across the street from [employer]  [address] [place], [address] 8 am to 5 pm Monday-Friday * In the [facility] on the [facility]   The following sites will take your insurance:  . [facility]  [phone] Get Driving Directions Find a Provider at this Location  [address] [place], [place] [address] . 10 am to 8 pm Monday-Friday . 12 pm to 8 pm Saturday-Sunday   . [facility] at [facility]  [phone] Get Driving Directions Find a Provider at this Location  [address], [address] [place], [place] [address] . 8 am to 8 pm Monday-Friday . 9 am to 6 pm Saturday . 11 am to 6 pm Sunday   . [facility]gent Care at [facility]  [phone] Get Driving Directions  [address] [place], [place] [address] . 8 am to 8 pm Monday-Friday . 8 am to 4 pm Saturday-Sunday   Your e-visit answers were reviewed by a board certified  advanced clinical practitioner to complete your personal care plan.  Thank you for using e-Visits.

## 2018-03-15 DIAGNOSIS — H6503 Acute serous otitis media, bilateral: Secondary | ICD-10-CM | POA: Diagnosis not present

## 2018-03-15 DIAGNOSIS — J0100 Acute maxillary sinusitis, unspecified: Secondary | ICD-10-CM | POA: Diagnosis not present

## 2018-03-16 ENCOUNTER — Encounter: Payer: Self-pay | Admitting: Family Medicine

## 2018-03-16 ENCOUNTER — Other Ambulatory Visit: Payer: Self-pay | Admitting: Family Medicine

## 2018-03-16 DIAGNOSIS — G4700 Insomnia, unspecified: Secondary | ICD-10-CM

## 2018-03-16 MED ORDER — ZOLPIDEM TARTRATE 10 MG PO TABS: ORAL_TABLET | ORAL | 0 refills | Status: DC

## 2018-03-16 NOTE — Telephone Encounter (Signed)
Refill sent.

## 2018-03-16 NOTE — Telephone Encounter (Signed)
Copied from CRM [id number]. Topic: Quick Communication - Rx Refill/Question >> Mar 16, 2018 11:23 AM Lynne Logan D wrote: Medication: zolpidem (AMBIEN) 10 MG tablet / Pt stated she is out of medication and unable to sleep without it. She stated she contacted the pharmacy yesterday for a refill.  Has the patient contacted their pharmacy? Yes.   (Agent: If no, request that the patient contact the pharmacy for the refill.) (Agent: If yes, when and what did the pharmacy advise?)  Preferred Pharmacy (with phone number or street name): [id number] - [place], [place] - [address] AT [facility] [address] [phone] (Phone) [phone] (Fax)  Agent: Please be advised that RX refills may take up to 3 business days. We ask that you follow-up with your pharmacy.

## 2018-03-17 ENCOUNTER — Ambulatory Visit: Payer: Self-pay | Admitting: *Deleted

## 2018-03-17 NOTE — Telephone Encounter (Signed)
Pt was also advised to call back if increase in rash with itching, difficulty breathing or tongue swelling. Pt voiced understanding.

## 2018-03-17 NOTE — Telephone Encounter (Signed)
Pt called with having hives after starting an antibiotic from urgent care yesterday. She said an hour or 2 after taking the first dose she started itching. The antibiotic is Augmentin.  She denies fever, shortness of breath or tongue swelling. The rash is from her waist up, esp on the arms, neck, stomach and face. She is itching. Advised to try taking Benadryl for the itching and to go back to the urgent care. She called the urgent care and was advised to call her pcp. She is at work right now and can not come to an appointment today. She will try the Benadryl and see how she does with that. If no better will call back and schedule an appointment. Routing to flow at [facility] at [facility].  Reason for Disposition . [1] Widespread hives, itching or facial swelling AND [2] onset > 2 hours after exposure to high-risk allergen (e.g., sting, nuts, 1st dose of antibiotic)  Answer Assessment - Initial Assessment Questions 1. APPEARANCE: "What does the rash look like?"      Raised areas 2. LOCATION: "Where is the rash located?"      Arms, neck , stomach and face 3. NUMBER: "How many hives are there?"      Not sure 4. SIZE: "How big are the hives?" (inches, cm, compare to coins) "Do they all look the same or is there lots of variation in shape and size?"      Big and small 5. ONSET: "When did the hives begin?" (Hours or days ago)      Started yesterday 6. ITCHING: "Does it itch?" If so, ask: "How bad is the itch?"    - MILD: doesn't interfere with normal activities   - MODERATE - SEVERE: interferes with work, school, sleep, or other activities      Yes  moderate 7. RECURRENT PROBLEM: "Have you had hives before?" If so, ask: "When was the last time?" and "What happened that time?"      Yes when she has had skin allergies 8. TRIGGERS: "Were you exposed to any new food, plant, cosmetic product or animal just before the hives began?"     antibiotic 9. OTHER SYMPTOMS: "Do you have any other  symptoms?" (e.g., fever, tongue swelling, difficulty breathing, abdominal pain)     no 10. PREGNANCY: "Is there any chance you are pregnant?" "When was your last menstrual period?"     No periods,  Has an IUD.  Protocols used: HIVES-A-AH

## 2018-03-18 ENCOUNTER — Ambulatory Visit: Payer: 59 | Admitting: Medical

## 2018-03-19 ENCOUNTER — Telehealth: Payer: Self-pay | Admitting: *Deleted

## 2018-03-19 NOTE — Telephone Encounter (Signed)
Patient was scheduled to [facility].

## 2018-03-19 NOTE — Telephone Encounter (Signed)
Copied from CRM [id number]. Topic: General - Other >> Mar 18, 2018  5:52 PM Mickel Baas B, NT wrote: Reason for CRM: Patient calling and states that the had an appointment scheduled for today at 3:40 where she was fit in on the schedule. 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 States that she tried to call the office around lunch, but there was a long wait and she started having customers. Would like that put into consideration when it comes to the no show/cancellation fee.

## 2018-03-26 ENCOUNTER — Ambulatory Visit: Payer: Self-pay | Admitting: Family Medicine

## 2018-03-26 ENCOUNTER — Ambulatory Visit: Payer: 59 | Admitting: Family Medicine

## 2018-03-26 NOTE — Telephone Encounter (Signed)
Pt called in c/o having an asthma flare up.   She has chest tightness all the time for the last couple of days.   This is her usual symptom when her asthma flares up.  See triage notes.  I made her an appt with Dr. Zola Button for 03/27/2018 at 9:45.   Reason for Disposition . Chest pain(s) lasting a few seconds from coughing AND [2] persists > 3 days  Answer Assessment - Initial Assessment Questions 1. LOCATION: "Where does it hurt?"       I feel chest tightness.   I have asthma and it's flaring up.   The past couple of days it's bothered me.    I think it's allergies bothering my asthma. 2. RADIATION: "Does the pain go anywhere else?" (e.g., into neck, jaw, arms, back)     I was sick with a sinus infection 2 wks ago but fine now.   I need my asthma medicine. 3. ONSET: "When did the chest pain begin?" (Minutes, hours or days)      It's been 3-4 days and it feels tight in my chest like my asthma.   I have a cough once a while. 4. PATTERN "Does the pain come and go, or has it been constant since it started?"  "Does it get worse with exertion?"      *No Answer* 5. DURATION: "How long does it last" (e.g., seconds, minutes, hours)     *No Answer* 6. SEVERITY: "How bad is the pain?"  (e.g., Scale 1-10; mild, moderate, or severe)    - MILD (1-3): doesn't interfere with normal activities     - MODERATE (4-7): interferes with normal activities or awakens from sleep    - SEVERE (8-10): excruciating pain, unable to do any normal activities       I take allergy medicine. 7. CARDIAC RISK FACTORS: "Do you have any history of heart problems or risk factors for heart disease?" (e.g., prior heart attack, angina; high blood pressure, diabetes, being overweight, high cholesterol, smoking, or strong family history of heart disease)     No 8. PULMONARY RISK FACTORS: "Do you have any history of lung disease?"  (e.g., blood clots in lung, asthma, emphysema, birth control pills)     asthma 9. CAUSE: "What do  you think is causing the chest pain?"     My asthma 10. OTHER SYMPTOMS: "Do you have any other symptoms?" (e.g., dizziness, nausea, vomiting, sweating, fever, difficulty breathing, cough)       No 11. PREGNANCY: "Is there any chance you are pregnant?" "When was your last menstrual period?"       Not asked  Protocols used: CHEST PAIN-A-AH

## 2018-03-27 ENCOUNTER — Ambulatory Visit (HOSPITAL_BASED_OUTPATIENT_CLINIC_OR_DEPARTMENT_OTHER)
Admission: RE | Admit: 2018-03-27 | Discharge: 2018-03-27 | Disposition: A | Discharge status: Home / Self Care | Payer: 59 | Source: Ambulatory Visit | Attending: Family Medicine | Admitting: Family Medicine

## 2018-03-27 ENCOUNTER — Encounter: Payer: Self-pay | Admitting: Family Medicine

## 2018-03-27 ENCOUNTER — Other Ambulatory Visit: Payer: Self-pay | Admitting: Family Medicine

## 2018-03-27 ENCOUNTER — Ambulatory Visit: Payer: 59 | Admitting: Family Medicine

## 2018-03-27 ENCOUNTER — Ambulatory Visit: Payer: Self-pay

## 2018-03-27 ENCOUNTER — Other Ambulatory Visit: Payer: Self-pay

## 2018-03-27 VITALS — BP 118/80 | HR 90 | Temp 98.4°F | Resp 12 | Ht 66.0 in | Wt 155.0 lb

## 2018-03-27 DIAGNOSIS — R059 Cough, unspecified: Secondary | ICD-10-CM

## 2018-03-27 DIAGNOSIS — R05 Cough: Secondary | ICD-10-CM | POA: Diagnosis not present

## 2018-03-27 DIAGNOSIS — J209 Acute bronchitis, unspecified: Secondary | ICD-10-CM

## 2018-03-27 DIAGNOSIS — J45901 Unspecified asthma with (acute) exacerbation: Secondary | ICD-10-CM

## 2018-03-27 DIAGNOSIS — J45909 Unspecified asthma, uncomplicated: Principal | ICD-10-CM

## 2018-03-27 LAB — POCT INFLUENZA A/B
INFLUENZA A, POC: NEGATIVE
Influenza B, POC: NEGATIVE

## 2018-03-27 LAB — POCT RAPID STREP A (OFFICE): Rapid Strep A Screen: NEGATIVE

## 2018-03-27 MED ORDER — ALBUTEROL SULFATE HFA 108 (90 BASE) MCG/ACT IN AERS
2.0000 | INHALATION_SPRAY | Freq: Four times a day (QID) | RESPIRATORY_TRACT | 2 refills | Status: AC | PRN
Start: 2018-03-27 — End: ?

## 2018-03-27 MED ORDER — AZITHROMYCIN 250 MG PO TABS: ORAL_TABLET | ORAL | 0 refills | Status: DC

## 2018-03-27 NOTE — Telephone Encounter (Signed)
Pt. read MyChart CXR result note, with recommendations by Dr. Zola Button.  Stated that she just finished a Z-Pak 4-5 days ago, when she had a URI.  Stated she didn't think she and Dr. Zola Button discussed this at appt. today.  Also, is not taking "Dulera", and questioned where this came from?  Stated she picked up the Albuterol Inhaler today, but has not started using it yet.  Is requesting clarification on Dr. Laury Axon Chase's recommendations, before she picks up the medication at the Pharmacy.   Will send message to PCP.  Advised will try to have nurse contact pt. today with recommendations.  Agreed with plan.   Reason for Disposition . [1] Caller requesting NON-URGENT health information AND [2] PCP's office is the best resource  Answer Assessment - Initial Assessment Questions 1. REASON FOR CALL or QUESTION: "What is your reason for calling today?" or "How can I best help you?" or "What question do you have that I can help answer?"     Pt. Requesting clarification on Dr. Laury Axon Chase's instructions on MyChart CXR result.  Protocols used: INFORMATION ONLY CALL-A-AH  Message from Darron Doom sent at 03/27/2018 3:53 PM EDT   Patient asking for a nurse to give her a call she was prescribed some medication and she have questions before she go to the pharmacy to pick them up. Ph# [phone]

## 2018-03-27 NOTE — Progress Notes (Signed)
Patient ID: Angela Bryant, female    DOB: 1980/02/26  Age: 38 y.o. MRN: [id number]    Subjective:  Subjective  HPI KEISI HOVEN presents for chest tightness and exacerbation asthma   She is using her advair inhaler  No fever She feels ok now because she used her advair before she came  Review of Systems  Constitutional: Negative for appetite change, chills, diaphoresis, fatigue, fever and unexpected weight change.  HENT: Positive for congestion, postnasal drip and sore throat. Negative for rhinorrhea and sinus pressure.   Eyes: Negative for pain, redness and visual disturbance.  Respiratory: Positive for cough, chest tightness, shortness of breath and wheezing.   Cardiovascular: Negative for chest pain, palpitations and leg swelling.  Endocrine: Negative for cold intolerance, heat intolerance, polydipsia, polyphagia and polyuria.  Genitourinary: Negative for difficulty urinating, dysuria and frequency.  Allergic/Immunologic: Negative for environmental allergies.  Neurological: Negative for dizziness, light-headedness, numbness and headaches.    History Past Medical History:  Diagnosis Date  . Anxiety   . Asthma   . GERD (gastroesophageal reflux disease)   . Insomnia   . Irritable bowel syndrome     She has a past surgical history that includes Anal Rectal manometry (N/A, 01/04/2015).   Her family history includes Asthma in her mother; Hypertension in her father.She reports that she has been smoking cigarettes. She has never used smokeless tobacco. She reports current alcohol use. She reports that she does not use drugs.  Current Outpatient Medications on File Prior to Visit  Medication Sig Dispense Refill  . ADVAIR DISKUS 500-50 MCG/DOSE AEPB INHALE 1 PUFF INTO THE LUNGS TWICE DAILY 60 each 2  . cyclobenzaprine (FLEXERIL) 5 MG tablet Take 1 tablet (5 mg total) by mouth 3 (three) times daily as needed for muscle spasms. 21 tablet 0  . ibuprofen (ADVIL,MOTRIN) 800 MG  tablet TAKE 1 TABLET(800 MG) BY MOUTH EVERY 8 HOURS AS NEEDED 90 tablet 0  . levonorgestrel (MIRENA) 20 MCG/24HR IUD 1 each by Intrauterine route once.    . linaclotide (LINZESS) 72 MCG capsule Take 1 capsule (72 mcg total) by mouth daily before breakfast. 30 capsule 2  . montelukast (SINGULAIR) 10 MG tablet Take 1 tablet by mouth at bedtime.    Marland Kitchen omeprazole (PRILOSEC) 40 MG capsule Take 1 capsule (40 mg total) by mouth daily. 90 capsule 3  . zolpidem (AMBIEN) 10 MG tablet TAKE 1 TABLET BY MOUTH EVERY DAY AT BEDTIME 30 tablet 0   No current facility-administered medications on file prior to visit.      Objective:  Objective  Physical Exam Vitals signs and nursing note reviewed.  Constitutional:      Appearance: She is well-developed.  HENT:     Right Ear: External ear normal.     Left Ear: External ear normal.  Eyes:     General:        Right eye: No discharge.        Left eye: No discharge.     Conjunctiva/sclera: Conjunctivae normal.  Cardiovascular:     Rate and Rhythm: Normal rate and regular rhythm.     Heart sounds: Normal heart sounds. No murmur.  Pulmonary:     Effort: Pulmonary effort is normal. No respiratory distress.     Breath sounds: Normal breath sounds. Decreased air movement present. No wheezing or rales.  Chest:     Chest wall: No tenderness.  Lymphadenopathy:     Cervical: Cervical adenopathy present.  Neurological:  Mental Status: She is alert and oriented to person, place, and time.    BP 118/80 (BP Location: Left Arm, Patient Position: Sitting, Cuff Size: Normal)   Pulse 90   Temp 98.4 F (36.9 C)   Resp 12   Ht 5\' 6"  (1.676 m)   Wt 155 lb (70.3 kg)   SpO2 98%   BMI 25.02 kg/m  Wt Readings from Last 3 Encounters:  03/27/18 155 lb (70.3 kg)  02/04/18 158 lb (71.7 kg)  12/08/17 161 lb (73 kg)     Lab Results  Component Value Date   WBC 9.7 12/08/2017   HGB 12.8 12/08/2017   HCT 37.7 12/08/2017   PLT 207.0 12/08/2017   GLUCOSE 87  06/26/2017   ALT 17 06/26/2017   AST 15 06/26/2017   NA 136 06/26/2017   K 4.2 06/26/2017   CL 103 06/26/2017   CREATININE 0.74 06/26/2017   BUN 8 06/26/2017   CO2 28 06/26/2017   TSH 0.85 12/23/2012    Dg Chest 2 View  Result Date: 12/02/2017 CLINICAL DATA:  Cough and chest pain EXAM: CHEST - 2 VIEW COMPARISON:  December 12, 2016 FINDINGS: No edema or consolidation. Heart size and pulmonary vascularity are normal. No adenopathy. No pneumothorax. No bone lesions. IMPRESSION: No edema or consolidation. Electronically Signed   By: Bretta Bang III M.D.   On: 12/02/2017 16:24     Assessment & Plan:  Plan  I am having Burnett Corrente. Schaller maintain her montelukast, levonorgestrel, ibuprofen, omeprazole, linaclotide, cyclobenzaprine, Advair Diskus, zolpidem, and albuterol.  Meds ordered this encounter  Medications  . albuterol (PROVENTIL HFA;VENTOLIN HFA) 108 (90 Base) MCG/ACT inhaler    Sig: Inhale 2 puffs into the lungs every 6 (six) hours as needed. Reported on 02/01/2015    Dispense:  1 Inhaler    Refill:  2    Problem List Items Addressed This Visit      Unprioritized   Moderate asthma with acute exacerbation    Use advair bid --- rinse mouth , brush tongue after each used Use albuterol prn Get cxr rx for z pack in case she gets worse  If she develops a fever she was instructed to call us        Relevant Medications   albuterol (PROVENTIL HFA;VENTOLIN HFA) 108 (90 Base) MCG/ACT inhaler   Other Relevant Orders   DG Chest 2 View (Completed)    Other Visit Diagnoses    Cough    -  Primary   Relevant Orders   POCT Influenza A/B (Completed)   POCT rapid strep A (Completed)   DG Chest 2 View (Completed)      Follow-up: Return if symptoms worsen or fail to improve.  Donato Schultz, DO

## 2018-03-27 NOTE — Patient Instructions (Signed)
Asthma, Adult    Asthma is a long-term (chronic) condition that causes recurrent episodes in which the airways become tight and narrow. The airways are the passages that lead from the nose and mouth down into the lungs. Asthma episodes, also called asthma attacks, can cause coughing, wheezing, shortness of breath, and chest pain. The airways can also fill with mucus. During an attack, it can be difficult to breathe. Asthma attacks can range from minor to life threatening.  Asthma cannot be cured, but medicines and lifestyle changes can help control it and treat acute attacks.  What are the causes?  This condition is believed to be caused by inherited (genetic) and environmental factors, but its exact cause is not known.  There are many things that can bring on an asthma attack or make asthma symptoms worse (triggers). Asthma triggers are different for each person. Common triggers include:   Mold.   Dust.   Cigarette smoke.   Cockroaches.   Things that can cause allergy symptoms (allergens), such as animal dander or pollen from trees or grass.   Air pollutants such as household cleaners, wood smoke, smog, or chemical odors.   Cold air, weather changes, and winds (which increase molds and pollen in the air).   Strong emotional expressions such as crying or laughing hard.   Stress.   Certain medicines (such as aspirin) or types of medicines (such as beta-blockers).   Sulfites in foods and drinks. Foods and drinks that may contain sulfites include dried fruit, potato chips, and sparkling grape juice.   Infections or inflammatory conditions such as the flu, a cold, or inflammation of the nasal membranes (rhinitis).   Gastroesophageal reflux disease (GERD).   Exercise or strenuous activity.  What are the signs or symptoms?  Symptoms of this condition may occur right after asthma is triggered or many hours later. Symptoms include:   Wheezing. This can sound like whistling when you breathe.   Excessive  nighttime or early morning coughing.   Frequent or severe coughing with a common cold.   Chest tightness.   Shortness of breath.   Tiredness (fatigue) with minimal activity.  How is this diagnosed?  This condition is diagnosed based on:   Your medical history.   A physical exam.   Tests, which may include:  ? Lung function studies and pulmonary studies (spirometry). These tests can evaluate the flow of air in your lungs.  ? Allergy tests.  ? Imaging tests, such as X-rays.  How is this treated?  There is no cure for this condition, but treatment can help control your symptoms. Treatment for asthma usually involves:   Identifying and avoiding your asthma triggers.   Using medicines to control your symptoms. Generally, two types of medicines are used to treat asthma:  ? Controller medicines. These help prevent asthma symptoms from occurring. They are usually taken every day.  ? Fast-acting reliever or rescue medicines. These quickly relieve asthma symptoms by widening the narrow and tight airways. They are used as needed and provide short-term relief.   Using supplemental oxygen. This may be needed during a severe episode.   Using other medicines, such as:  ? Allergy medicines, such as antihistamines, if your asthma attacks are triggered by allergens.  ? Immune medicines (immunomodulators). These are medicines that help control the immune system.   Creating an asthma action plan. An asthma action plan is a written plan for managing and treating your asthma attacks. This plan includes:  ? A list   of your asthma triggers and how to avoid them.  ? Information about when medicines should be taken and when their dosage should be changed.  ? Instructions about using a device called a peak flow meter. A peak flow meter measures how well the lungs are working and the severity of your asthma. It helps you monitor your condition.  Follow these instructions at home:  Controlling your home environment  Control your home  environment in the following ways to help avoid triggers and prevent asthma attacks:   Change your heating and air conditioning filter regularly.   Limit your use of fireplaces and wood stoves.   Get rid of pests (such as roaches and mice) and their droppings.   Throw away plants if you see mold on them.   Clean floors and dust surfaces regularly. Use unscented cleaning products.   Try to have someone else vacuum for you regularly. Stay out of rooms while they are being vacuumed and for a short while afterward. If you vacuum, use a dust mask from a hardware store, a double-layered or microfilter vacuum cleaner bag, or a vacuum cleaner with a HEPA filter.   Replace carpet with wood, tile, or vinyl flooring. Carpet can trap dander and dust.   Use allergy-proof pillows, mattress covers, and box spring covers.   Keep your bedroom a trigger-free room.   Avoid pets and keep windows closed when allergens are in the air.   Wash beddings every week in hot water and dry them in a dryer.   Use blankets that are made of polyester or cotton.   Clean bathrooms and kitchens with bleach. If possible, have someone repaint the walls in these rooms with mold-resistant paint. Stay out of the rooms that are being cleaned and painted.   Wash your hands often with soap and water. If soap and water are not available, use hand sanitizer.   Do not allow anyone to smoke in your home.  General instructions   Take over-the-counter and prescription medicines only as told by your health care provider.  ? Speak with your health care provider if you have questions about how or when to take the medicines.  ? Make note if you are requiring more frequent dosages.   Do not use any products that contain nicotine or tobacco, such as cigarettes and e-cigarettes. If you need help quitting, ask your health care provider. Also, avoid being exposed to secondhand smoke.   Use a peak flow meter as told by your health care provider. Record and  keep track of the readings.   Understand and use the asthma action plan to help minimize, or stop an asthma attack, without needing to seek medical care.   Make sure you stay up to date on your yearly vaccinations as told by your health care provider. This may include vaccines for the flu and pneumonia.   Avoid outdoor activities when allergen counts are high and when air quality is low.   Wear a ski mask that covers your nose and mouth during outdoor winter activities. Exercise indoors on cold days if you can.   Warm up before exercising, and take time for a cool-down period after exercise.   Keep all follow-up visits as told by your health care provider. This is important.  Where to find more information   For information about asthma, turn to the Centers for Disease Control and Prevention at www.cdc.gov/asthma/faqs.htm   For air quality information, turn to AirNow at https://airnow.gov/  Contact   a health care provider if:   You have wheezing, shortness of breath, or a cough even while you are taking medicine to prevent attacks.   The mucus you cough up (sputum) is thicker than usual.   Your sputum changes from clear or white to yellow, green, gray, or bloody.   Your medicines are causing side effects, such as a rash, itching, swelling, or trouble breathing.   You need to use a reliever medicine more than 2-3 times a week.   Your peak flow reading is still at 50-79% of your personal best after following your action plan for 1 hour.   You have a fever.  Get help right away if:   You are getting worse and do not respond to treatment during an asthma attack.   You are short of breath when at rest or when doing very little physical activity.   You have difficulty eating, drinking, or talking.   You have chest pain or tightness.   You develop a fast heartbeat or palpitations.   You have a bluish color to your lips or fingernails.   You are light-headed or dizzy, or you faint.   Your peak flow  reading is less than 50% of your personal best.   You feel too tired to breathe normally.  Summary   Asthma is a long-term (chronic) condition that causes recurrent episodes in which the airways become tight and narrow. These episodes can cause coughing, wheezing, shortness of breath, and chest pain.   Asthma cannot be cured, but medicines and lifestyle changes can help control it and treat acute attacks.   Make sure you understand how to avoid triggers and how and when to use your medicines.   Asthma attacks can range from minor to life threatening. Get help right away if you have an asthma attack and do not respond to treatment with your usual rescue medicines.  This information is not intended to replace advice given to you by your health care provider. Make sure you discuss any questions you have with your health care provider.  Document Released: 12/31/2004 Document Revised: 02/05/2016 Document Reviewed: 02/05/2016  Elsevier Interactive Patient Education  2019 Elsevier Inc.

## 2018-03-28 DIAGNOSIS — J45901 Unspecified asthma with (acute) exacerbation: Secondary | ICD-10-CM | POA: Insufficient documentation

## 2018-03-28 NOTE — Assessment & Plan Note (Signed)
Use advair bid --- rinse mouth , brush tongue after each used Use albuterol prn Get cxr rx for z pack in case she gets worse  If she develops a fever she was instructed to call us

## 2018-03-30 ENCOUNTER — Other Ambulatory Visit: Payer: Self-pay | Admitting: Family Medicine

## 2018-03-30 ENCOUNTER — Telehealth: Payer: Self-pay | Admitting: Family Medicine

## 2018-03-30 MED ORDER — LEVOFLOXACIN 500 MG PO TABS: 500.0000 mg | ORAL_TABLET | Freq: Every day | ORAL | 0 refills | Status: DC

## 2018-03-30 MED ORDER — PREDNISONE 10 MG PO TABS: ORAL_TABLET | ORAL | 0 refills | Status: DC

## 2018-03-30 NOTE — Telephone Encounter (Signed)
Patient stated that she has not picked up medication over the weekend.  She stated that she had been on zpak before when she had went to an urgent care.  She wanted to know if she should maybe have something different or take the same thing again.    Also her left eyelid is sore.  This started last night.  She is not sure if this has anything to do with her being sick.  It hurts to close and blink.  Its a little swollen.  I asked patient if she sees any bumps or anything and she says no.

## 2018-03-30 NOTE — Telephone Encounter (Signed)
Spoke with pharmacy and confirmed that verbal order was received for Levaquin. Pharmacy verifies that prescription was received.

## 2018-03-30 NOTE — Telephone Encounter (Signed)
Copied from CRM [id number]. Topic: Quick Communication - Rx Refill/Question >> Mar 30, 2018 12:29 PM Jens Som A wrote: Medication: levofloxacin (LEVAQUIN) 500 MG tablet [[id number]]  Patient states the pharmacy does not have it  Has the patient contacted their pharmacy? Yes  (Agent: If no, request that the patient contact the pharmacy for the refill.) (Agent: If yes, when and what did the pharmacy advise?)  Preferred Pharmacy (with phone number or street name): [id number] - [place], [place] - [address] AT [facility] [address] [phone] (Phone) [phone] (Fax)    Agent: Please be advised that RX refills may take up to 3 business days. We ask that you follow-up with your pharmacy.

## 2018-03-30 NOTE — Telephone Encounter (Signed)
levaquin 500 mg 1 po qd x 7 days   Not sure what is going on with her eyelid-- -it should not be related to her illness Does she need another note

## 2018-03-30 NOTE — Telephone Encounter (Signed)
Patient notified to get all medications levaquin and prednisone.   She did not need a note cause she cannot miss any work.

## 2018-03-30 NOTE — Addendum Note (Signed)
Addended by: Thelma Barge D on: 03/30/2018 11:50 AM   Modules accepted: Orders

## 2018-03-30 NOTE — Telephone Encounter (Signed)
I meant the advair --- sorry  We did not discuss z pak at ov ---  When her cxr came back I had expected one of the cma to call her with this advice How is she feeling?  Is her office still open.  She probably does not need to be there esp if she is still sick and she is not allowed to wear a mask

## 2018-03-31 ENCOUNTER — Other Ambulatory Visit: Payer: Self-pay | Admitting: Family Medicine

## 2018-03-31 ENCOUNTER — Telehealth: Payer: Self-pay | Admitting: Family Medicine

## 2018-03-31 DIAGNOSIS — H10023 Other mucopurulent conjunctivitis, bilateral: Secondary | ICD-10-CM

## 2018-03-31 MED ORDER — BACITRACIN-POLYMYXIN B 500-10000 UNIT/GM OP OINT: 1.0000 "application " | TOPICAL_OINTMENT | Freq: Four times a day (QID) | OPHTHALMIC | 0 refills | Status: DC

## 2018-03-31 MED ORDER — MOXIFLOXACIN HCL 0.5 % OP SOLN: 1.0000 [drp] | Freq: Three times a day (TID) | OPHTHALMIC | 0 refills | Status: DC

## 2018-03-31 NOTE — Telephone Encounter (Signed)
Drops called in but if she is no better in 2-3 days she should see an eye dr Reyes Ivan towels / linens daily

## 2018-03-31 NOTE — Telephone Encounter (Signed)
Patient states that insurance will no cover moxifloxacin (VIGAMOX) 0.5 % ophthalmic solution- A new request as been faxed to office. Patient wanted to make office aware.

## 2018-03-31 NOTE — Telephone Encounter (Signed)
Spoke w/ Pt- informed of recommendations. Pt verbalized understanding.  

## 2018-03-31 NOTE — Telephone Encounter (Signed)
Awaiting PA determination.

## 2018-03-31 NOTE — Telephone Encounter (Signed)
Patient called again because she said no one returned her call regarding some medication for pink eye.  Patient is frustrated and would like to know what she should do.  CB# [phone]

## 2018-03-31 NOTE — Telephone Encounter (Signed)
Please advise 

## 2018-03-31 NOTE — Telephone Encounter (Signed)
Copied from CRM [id number]. Topic: Quick Communication - See Telephone Encounter >> Mar 31, 2018  8:46 AM Maia Petties wrote: CRM for notification. See Telephone encounter for: 03/31/18. Left eye eyelid started hurting yesterday. Last night had green slimy stuff. At 3am eye was crusted together. Pt woke again at 6am and had the same issue. Eye is a little pink but not itching. Eyelid is red and a little swollen but pt states it doesn't appear to be a stye. Pt thinking she has pink eye and asking if she can get eye drop since she was just seen. [id number] - [place],  - [address] AT [facility] [address] [phone] (Phone) [phone] (Fax)

## 2018-03-31 NOTE — Telephone Encounter (Signed)
PA initiated via Covermymeds;KEY: [id number]. Awaiting determination.

## 2018-04-01 ENCOUNTER — Ambulatory Visit: Payer: Self-pay | Admitting: *Deleted

## 2018-04-01 NOTE — Telephone Encounter (Signed)
We still have not received anything yet.  Do you want to change to something else?

## 2018-04-01 NOTE — Telephone Encounter (Signed)
03/27/18 seen for asthma exacerbation.Reporting she does not feel like she is improving, yet. Dry cough continues. Reports chest tightness intermittently  for 1.5 weeks.Using advair twice daily, using albuterol inhaler once daily. Stated "it's probably her anxiety causing her chest discomfort. For her anxiety, she began taking paxil 10 MG that was prescribed in 10/19 but was discontinued in 11/19 as she reported she had not began taking it. She would like to continue the Paxil for 30 days to see if it helps with her anxiety. She has held off taking the zpak that Dr.Lowne Chase prescribed on the 13th.Also, only took the 1st day's dose of the prednisone prescribed due to it made her feel nervous.No fever. Routing to PCP please advise.   Concerns she would like Dr. Zola Button to address: 1) Is there any other medication like the prednisone she could try that would not make her feel nervous? 2) Is it okay to continue taking the Paxil 10 MG tabs-one tab daily if she feels it is helping? 3)She would like the order to go ahead with an ultrasound for the knot on the left side of her neck. Feels that might help ease her anxiety if she knew what caused it.  Reviewed safety and health precautions regarding an appointment at a hospital during this time.

## 2018-04-01 NOTE — Telephone Encounter (Signed)
She has got to take the medication . Ok to refill paxil but she needs th prednisone to get the inflammation down in her lungs  Refer to pulmonary

## 2018-04-01 NOTE — Telephone Encounter (Signed)
See 12:14p encounter with patient's concerns. Reason for Disposition . Pharmacy calling with prescription questions and triager unable to answer question  Answer Assessment - Initial Assessment Questions 1. REASON FOR CALL or QUESTION: "What is your reason for calling today?" or "How can I best help you?" or "What question do you have that I can help answer?"     "just not feeling much improvement since Friday's visit"  Protocols used: MEDICATION QUESTION CALL-A-AH, INFORMATION ONLY CALL-A-AH

## 2018-04-01 NOTE — Telephone Encounter (Signed)
I already did

## 2018-04-02 NOTE — Telephone Encounter (Signed)
TC to patient. Relayed physician's message to her. Stated she would take the prednisone. She will call after 3 weeks on the paxil for a refill if she notices a difference.

## 2018-04-06 ENCOUNTER — Other Ambulatory Visit: Payer: Self-pay | Admitting: Family Medicine

## 2018-04-06 DIAGNOSIS — G4700 Insomnia, unspecified: Secondary | ICD-10-CM

## 2018-04-07 ENCOUNTER — Encounter: Payer: Self-pay | Admitting: Family Medicine

## 2018-04-07 ENCOUNTER — Telehealth: Payer: Self-pay | Admitting: *Deleted

## 2018-04-07 NOTE — Telephone Encounter (Signed)
Requesting: Ambien Contract: N/A UDS: No, low risk, was due 09/30/2017 Last OV: 03/27/2018 Next OV: N/A Last Refill: 03/16/2018, #30--0 RF Database:   Please advise

## 2018-04-07 NOTE — Telephone Encounter (Signed)
Can she do a web ex today?

## 2018-04-07 NOTE — Telephone Encounter (Signed)
Copied from CRM [id number]. Topic: General - Other >> Apr 07, 2018  2:54 PM Jaquita Rector A wrote: Reason for CRM: Patient said that she had a panic attack this morning and say yes she is willing to do a web visit today. Please call patient at Ph# [phone]

## 2018-04-08 ENCOUNTER — Other Ambulatory Visit: Payer: Self-pay | Admitting: Family Medicine

## 2018-04-08 ENCOUNTER — Other Ambulatory Visit: Payer: Self-pay

## 2018-04-08 ENCOUNTER — Ambulatory Visit (INDEPENDENT_AMBULATORY_CARE_PROVIDER_SITE_OTHER): Payer: 59 | Admitting: Family Medicine

## 2018-04-08 ENCOUNTER — Encounter: Payer: Self-pay | Admitting: Family Medicine

## 2018-04-08 DIAGNOSIS — F411 Generalized anxiety disorder: Secondary | ICD-10-CM | POA: Diagnosis not present

## 2018-04-08 MED ORDER — DIAZEPAM 5 MG PO TABS: 5.0000 mg | ORAL_TABLET | Freq: Two times a day (BID) | ORAL | 1 refills | Status: DC | PRN

## 2018-04-08 NOTE — Telephone Encounter (Signed)
Appointment made for today

## 2018-04-08 NOTE — Progress Notes (Signed)
Virtual Visit via Video Note  I connected with Angela Bryant on 04/08/18 at  by a video enabled telemedicine application and verified that I am speaking with the correct person using two identifiers.   I discussed the limitations of evaluation and management by telemedicine and the availability of in person appointments. The patient expressed understanding and agreed to proceed.  History of Present Illness: Has been having panic attacks. Started back on Paxil 10 mg/d 1 week ago, has not noticed a difference. Hx of GAD. Covid situation made things much worse, though her anxiety started resuming around 11/2017. Panic attacks include palpitations, chest tightness/pain, sweating, anxiety, feelings of panic. No SI or HI. No self medication.   Observations/Objective: No acute distress Nml mood and affect Age appropriate judgment and insight No conversational dyspnea  Assessment and Plan: GAD- Cont Paxil, start Valium 5 mg q 12 hrs prn. Do not use w Ambien. [facility] info given as well as anxiety coping. Exercise rec'd. F/u with Dr Laury Axon to rec in 3-4 weeks or as originally scheduled.   Follow Up Instructions: As noted above.    I discussed the assessment and treatment plan with the patient. The patient was provided an opportunity to ask questions and all were answered. The patient agreed with the plan and demonstrated an understanding of the instructions.   The patient was advised to call back or seek an in-person evaluation if the symptoms worsen or if the condition fails to improve as anticipated.    Jilda Roche [place], DO

## 2018-04-08 NOTE — Telephone Encounter (Signed)
lmom to call back to set up appt for webex visit.

## 2018-04-08 NOTE — Progress Notes (Signed)
Virtual Visit via Video Note  I connected with Brielle F Lansberry on 04/08/18 at  by a video enabled telemedicine application and verified that I am speaking with the correct person using two identifiers.   I discussed the limitations of evaluation and management by telemedicine and the availability of in person appointments. The patient expressed understanding and agreed to proceed.  History of Present Illness: Has been having panic attacks. Started back on Paxil 10 mg/d 1 week ago, has not noticed a difference. Hx of GAD. Covid situation made things much worse, though her anxiety started resuming around 11/2017. Panic attacks include palpitations, chest tightness/pain, sweating, anxiety, feelings of panic. No SI or HI. No self medication.   Observations/Objective: No acute distress Nml mood and affect Age appropriate judgment and insight No conversational dyspnea  Assessment and Plan: GAD- Cont Paxil, start Valium 5 mg q 12 hrs prn. Do not use w Ambien. [facility] info given as well as anxiety coping. Exercise rec'd. F/u with Dr Lowne to rec in 3-4 weeks or as originally scheduled.   Follow Up Instructions: As noted above.    I discussed the assessment and treatment plan with the patient. The patient was provided an opportunity to ask questions and all were answered. The patient agreed with the plan and demonstrated an understanding of the instructions.   The patient was advised to call back or seek an in-person evaluation if the symptoms worsen or if the condition fails to improve as anticipated.    Nicholas Paul [place], DO   

## 2018-04-09 ENCOUNTER — Telehealth: Payer: Self-pay | Admitting: Family Medicine

## 2018-04-09 NOTE — Telephone Encounter (Signed)
Sorry for misunderstanding. In general, don't take both of them at the same time. If she wakes up 2 hours later having a panic attack, OK to take the Valium. Ty.

## 2018-04-09 NOTE — Telephone Encounter (Signed)
The patient informed by mychart per provider.

## 2018-04-09 NOTE — Telephone Encounter (Signed)
Spoke to the patient and she was told not to take valium and ambien at the same time.  She is ok during the day but at night cannot sleep without taking ambien. She has not started the valium yet. She is very scared as having chest pain/heart racing which is all new to her. She is waking every 2 hours at night. She knows she needs to stop the [place] but is afraid to stop.  Has not started the Valium yet?? Needs advisement asap as she is very concerned.

## 2018-04-09 NOTE — Telephone Encounter (Signed)
Copied from CRM [id number]. Topic: Quick Communication - See Telephone Encounter >> Apr 09, 2018  8:12 AM Louie Bun, Rosey Bath D wrote: CRM for notification. See Telephone encounter for: 04/09/18. Patient called and would like to talk to Dr. Carmelia Roller or his CMA regarding her web visit she had yesterday. She said she can't sleep at night and is scared to stop her medication as Dr. Carmelia Roller requested her to do. Please call patient back, thanks.

## 2018-04-10 ENCOUNTER — Ambulatory Visit: Payer: 59 | Admitting: Family Medicine

## 2018-04-10 ENCOUNTER — Encounter: Payer: Self-pay | Admitting: Family Medicine

## 2018-04-10 ENCOUNTER — Other Ambulatory Visit: Payer: Self-pay

## 2018-04-10 VITALS — BP 108/70 | HR 78 | Temp 98.8°F | Ht 66.0 in | Wt 150.0 lb

## 2018-04-10 DIAGNOSIS — R002 Palpitations: Secondary | ICD-10-CM

## 2018-04-10 DIAGNOSIS — R0789 Other chest pain: Secondary | ICD-10-CM | POA: Diagnosis not present

## 2018-04-10 DIAGNOSIS — F419 Anxiety disorder, unspecified: Secondary | ICD-10-CM

## 2018-04-10 LAB — COMPREHENSIVE METABOLIC PANEL
ALT: 10 U/L (ref 0–35)
AST: 13 U/L (ref 0–37)
Albumin: 4.5 g/dL (ref 3.5–5.2)
Alkaline Phosphatase: 45 U/L (ref 39–117)
BUN: 7 mg/dL (ref 6–23)
CO2: 27 mEq/L (ref 19–32)
Calcium: 9.6 mg/dL (ref 8.4–10.5)
Chloride: 104 mEq/L (ref 96–112)
Creatinine, Ser: 0.69 mg/dL (ref 0.40–1.20)
GFR: 95.67 mL/min (ref >60.00)
Glucose, Bld: 92 mg/dL (ref 70–99)
POTASSIUM: 4.3 meq/L (ref 3.5–5.1)
Sodium: 139 mEq/L (ref 135–145)
Total Bilirubin: 0.4 mg/dL (ref 0.2–1.2)
Total Protein: 7.1 g/dL (ref 6.0–8.3)

## 2018-04-10 LAB — VITAMIN D 25 HYDROXY (VIT D DEFICIENCY, FRACTURES): VITD: 30.72 ng/mL (ref 30.00–100.00)

## 2018-04-10 LAB — CBC
HEMATOCRIT: 41.4 % (ref 36.0–46.0)
Hemoglobin: 13.8 g/dL (ref 12.0–15.0)
MCHC: 33.4 g/dL (ref 30.0–36.0)
MCV: 92.2 fl (ref 78.0–100.0)
Platelets: 223 10*3/uL (ref 150.0–400.0)
RBC: 4.5 Mil/uL (ref 3.87–5.11)
RDW: 13.4 % (ref 11.5–15.5)
WBC: 7.1 10*3/uL (ref 4.0–10.5)

## 2018-04-10 LAB — TSH: TSH: 1.3 u[IU]/mL (ref 0.35–4.50)

## 2018-04-10 LAB — T4, FREE: Free T4: 1.06 ng/dL (ref 0.60–1.60)

## 2018-04-10 MED ORDER — PAROXETINE HCL 10 MG PO TABS: 10.0000 mg | ORAL_TABLET | Freq: Every day | ORAL | 3 refills | Status: DC

## 2018-04-10 MED ORDER — CLONAZEPAM 0.5 MG PO TABS: 0.5000 mg | ORAL_TABLET | Freq: Two times a day (BID) | ORAL | 1 refills | Status: DC | PRN

## 2018-04-10 NOTE — Progress Notes (Signed)
Chief Complaint  Patient presents with  . Anxiety    Subjective: Patient is a 38 y.o. female here for anxiety f/u.  Phone visit earlier in the week. Valium 5 mg q 12 hrs prn rx'd. Lasts for around 5-6 hrs before wearing off. Also taking Paxil 10 mg/d. Has been on for around 1.5 weeks. Coronavirus pandemic adding to anxiety, but this had been increasing since a few months prior to becoming a known issue in [place]. She is waking up with palpitations, chest pain, very concerned for her heart. Checked BP the other day and it was elevated, which is very rare for her. No heart history. Father with HTN, no other famhx of heart disease. Rarely will smoke cigarettes. Chest pain has been constant for 1.5 weeks since this started. Described as achy. Not exertional.   ROS: Heart: Denies current palpitations  Lungs: Denies SOB   Past Medical History:  Diagnosis Date  . Anxiety   . Asthma   . GERD (gastroesophageal reflux disease)   . Insomnia   . Irritable bowel syndrome     Objective: BP 108/70 (BP Location: Left Arm, Patient Position: Sitting, Cuff Size: Normal)   Pulse 78   Temp 98.8 F (37.1 C) (Oral)   Ht 5\' 6"  (1.676 m)   Wt 150 lb (68 kg)   SpO2 (!) 87%   BMI 24.21 kg/m  General: Awake, appears stated age HEENT: MMM, EOMi Heart: RRR, no bruits or LE edema Lungs: CTAB, no rales, wheezes or rhonchi. No accessory muscle use MSK: No ttp over chest wall Psych: Age appropriate judgment and insight, normal affect and mood  Assessment and Plan: Anxiety - Plan: CBC, Comprehensive metabolic panel, TSH, T4, free, Vitamin D (25 hydroxy), clonazePAM (KLONOPIN) 0.5 MG tablet, PARoxetine (PAXIL) 10 MG tablet  Heart palpitations - Plan: EKG 12-Lead  Atypical chest pain  Refill Paxil. Change Valium to Klonopin. Keep mind occupied. Discussed exercise as supplement to tx. Ck labs. EKG reassuring. Low risk, atypical chest pain. She was reassured. I did offer to send her to cardiology if she is very  anxious about issue for further reassurance if she decides.  F/u in 3 weeks with reg pcp for E visit.  The patient voiced understanding and agreement to the plan.  Jilda Roche [place], DO 04/10/18  11:08 AM

## 2018-04-10 NOTE — Patient Instructions (Addendum)
Aim to do some physical exertion for 150 minutes per week. This is typically divided into 5 days per week, 30 minutes per day. The activity should be enough to get your heart rate up. Anything is better than nothing if you have time constraints.  Maybe do the anxiety coping strategies prophylactically.   Let us know if you need anything.

## 2018-04-13 ENCOUNTER — Other Ambulatory Visit: Payer: Self-pay | Admitting: Family Medicine

## 2018-04-13 MED ORDER — SERTRALINE HCL 50 MG PO TABS: 50.0000 mg | ORAL_TABLET | Freq: Every day | ORAL | 3 refills | Status: DC

## 2018-04-13 MED ORDER — HYDROXYZINE HCL 10 MG PO TABS: 10.0000 mg | ORAL_TABLET | Freq: Three times a day (TID) | ORAL | 0 refills | Status: DC | PRN

## 2018-04-13 NOTE — Progress Notes (Signed)
Change Klonopin to Hydroxyzine. Change Paxil to Zoloft. Needs to f/u with PCP or sched E visit with PCP if no improvement. Ty.

## 2018-04-22 ENCOUNTER — Telehealth: Payer: Self-pay | Admitting: *Deleted

## 2018-04-22 NOTE — Telephone Encounter (Signed)
Pt had visit with Dr Carmelia Roller 04/10/18 and was advised to f/u with PCP in 3 weeks (05/01/18). Attempted to reach pt to schedule Virtual Visit and left message to call us back to schedule appt.

## 2018-04-23 NOTE — Telephone Encounter (Signed)
appt made for 05/01/18 

## 2018-05-01 ENCOUNTER — Encounter: Payer: Self-pay | Admitting: Family Medicine

## 2018-05-01 ENCOUNTER — Telehealth: Payer: Self-pay

## 2018-05-01 ENCOUNTER — Ambulatory Visit: Payer: 59 | Admitting: Family Medicine

## 2018-05-01 NOTE — Telephone Encounter (Signed)
Copied from CRM [id number]. Topic: General - Inquiry >> Apr 30, 2018  4:12 PM Terisa Starr wrote: Reason for CRM: Patient states she spoke to Dr Zola Button at her last visit about the places on her left side of neck. She said that Dr Zola Button advised her to wait until all the mess with Covid was over and she would order an Ultra Sound. Patient says she would like to really go ahead and proceed with that at an imaging center because she thinks that it is getting worse. She said its painful to swallow and it bothers her all the time

## 2018-05-04 ENCOUNTER — Other Ambulatory Visit: Payer: Self-pay | Admitting: Family Medicine

## 2018-05-04 DIAGNOSIS — R590 Localized enlarged lymph nodes: Secondary | ICD-10-CM

## 2018-05-04 NOTE — Telephone Encounter (Signed)
I am sending a mychart message about this as well.  She canceled her appointment on 05/01/18 for follow up.  She does not want to do virtual visit.

## 2018-05-04 NOTE — Telephone Encounter (Signed)
Order is in.

## 2018-05-04 NOTE — Telephone Encounter (Signed)
mychart message sent

## 2018-05-05 ENCOUNTER — Ambulatory Visit (HOSPITAL_BASED_OUTPATIENT_CLINIC_OR_DEPARTMENT_OTHER): Payer: 59

## 2018-05-05 ENCOUNTER — Other Ambulatory Visit: Payer: Self-pay | Admitting: Family Medicine

## 2018-05-05 DIAGNOSIS — R590 Localized enlarged lymph nodes: Secondary | ICD-10-CM

## 2018-05-06 ENCOUNTER — Other Ambulatory Visit: Payer: Self-pay

## 2018-05-06 ENCOUNTER — Ambulatory Visit (INDEPENDENT_AMBULATORY_CARE_PROVIDER_SITE_OTHER): Payer: 59

## 2018-05-06 DIAGNOSIS — R590 Localized enlarged lymph nodes: Secondary | ICD-10-CM | POA: Diagnosis not present

## 2018-05-06 DIAGNOSIS — E041 Nontoxic single thyroid nodule: Secondary | ICD-10-CM | POA: Diagnosis not present

## 2018-05-07 ENCOUNTER — Encounter: Payer: Self-pay | Admitting: Family Medicine

## 2018-05-08 NOTE — Telephone Encounter (Signed)
The note is from me---- radiology read it as benign based on how it looked on the [place] and size.   It is small and does not need biopsy according to the radiologist  She can see ent --- but with covid --- the appointment will most likely not be until after things settle down

## 2018-05-08 NOTE — Telephone Encounter (Signed)
Spoke with patient as well she wanted to know, how do we know its benign?  She is also worried because she has had family history of cancer.

## 2018-05-08 NOTE — Telephone Encounter (Signed)
The note is from me ---- the radiologist read it as benign and it is too small to biopsy We can refer to ent if she would like but according to the radiologist it does not need biopsy

## 2018-05-12 ENCOUNTER — Other Ambulatory Visit: Payer: Self-pay | Admitting: Family Medicine

## 2018-05-12 DIAGNOSIS — G4700 Insomnia, unspecified: Secondary | ICD-10-CM

## 2018-05-12 NOTE — Telephone Encounter (Signed)
Requesting: Ambien  Contract: N/A UDS: N/A, low risk, next screen, 09/30/2017 Last OV: 04/10/2018 Next OV: N/A Last Refill: 04/07/2018, #30--0 RF Database:   Please advise

## 2018-05-28 ENCOUNTER — Ambulatory Visit: Payer: Self-pay | Admitting: *Deleted

## 2018-05-28 NOTE — Telephone Encounter (Signed)
  Reason for Disposition . Nursing judgment or information in reference  Answer Assessment - Initial Assessment Questions 1. REASON FOR CALL: "What is your main concern right now?"     Patient wants to know if her blood type has ever been tested  Protocols used: NO GUIDELINE AVAILABLE-A-AH

## 2018-05-28 NOTE — Telephone Encounter (Signed)
Summary: blood type   Pt calling to speak with a nurse to see if we have her blood type on file.     (no type and screen in lab reports)  Left message to call back

## 2018-05-29 NOTE — Telephone Encounter (Signed)
Spoke with patient. Advised to donate blood.

## 2018-06-09 ENCOUNTER — Other Ambulatory Visit: Payer: Self-pay | Admitting: Family Medicine

## 2018-06-09 ENCOUNTER — Telehealth: Payer: Self-pay | Admitting: *Deleted

## 2018-06-09 DIAGNOSIS — G4700 Insomnia, unspecified: Secondary | ICD-10-CM

## 2018-06-09 MED ORDER — ZOLPIDEM TARTRATE 10 MG PO TABS: 10.0000 mg | ORAL_TABLET | Freq: Every day | ORAL | 0 refills | Status: DC

## 2018-06-09 NOTE — Telephone Encounter (Signed)
Refilled

## 2018-06-09 NOTE — Telephone Encounter (Signed)
Last written: 05/12/18 Last ov: 04/10/18 with wendling Next ov: none Contract: will get at next ov UDS: will get at next ov

## 2018-06-10 ENCOUNTER — Telehealth: Payer: Self-pay | Admitting: Family Medicine

## 2018-06-10 ENCOUNTER — Other Ambulatory Visit: Payer: Self-pay | Admitting: Family Medicine

## 2018-06-10 DIAGNOSIS — G4700 Insomnia, unspecified: Secondary | ICD-10-CM

## 2018-06-10 MED ORDER — ZOLPIDEM TARTRATE 10 MG PO TABS: 10.0000 mg | ORAL_TABLET | Freq: Every day | ORAL | 0 refills | Status: DC

## 2018-06-10 NOTE — Telephone Encounter (Signed)
Pt needs Ambien resent to [facility] instead of [facility] please.

## 2018-06-10 NOTE — Telephone Encounter (Signed)
canceled

## 2018-06-10 NOTE — Telephone Encounter (Unsigned)
Copied from CRM [id number]. Topic: Quick Communication - Rx Refill/Question >> Jun 10, 2018 10:15 AM Marylen Ponto wrote: Pt stated she needs the Rx for zolpidem (AMBIEN) 10 MG tablet to be sent to [facility].  Medication:  zolpidem (AMBIEN) 10 MG tablet   Has the patient contacted their pharmacy? yes   Preferred Pharmacy (with phone number or street name): [facility] [address] [place], [place] - [address] [place]. [phone] (Phone) [phone] (Fax)  Agent: Please be advised that RX refills may take up to 3 business days. We ask that you follow-up with your pharmacy.

## 2018-06-10 NOTE — Telephone Encounter (Signed)
Please make sure we cancel [facility] rx

## 2018-07-07 ENCOUNTER — Other Ambulatory Visit: Payer: Self-pay | Admitting: Family Medicine

## 2018-07-07 DIAGNOSIS — G4700 Insomnia, unspecified: Secondary | ICD-10-CM

## 2018-07-09 ENCOUNTER — Other Ambulatory Visit: Payer: Self-pay | Admitting: Family Medicine

## 2018-07-09 DIAGNOSIS — G4700 Insomnia, unspecified: Secondary | ICD-10-CM

## 2018-07-09 MED ORDER — ZOLPIDEM TARTRATE 10 MG PO TABS: 10.0000 mg | ORAL_TABLET | Freq: Every day | ORAL | 0 refills | Status: DC

## 2018-07-09 NOTE — Telephone Encounter (Signed)
Ambien refill  Last OV: 04/10/2018 Last Fill: 06/10/2018 #30 and 0RF UDS: 09/30/2016 Low risk

## 2018-07-09 NOTE — Telephone Encounter (Signed)
Medication: zolpidem (AMBIEN) 10 MG tablet  Has the patient contacted their pharmacy? yes   Preferred Pharmacy (with phone number or street name):  [facility] [address] [place], [place]. [phone] (Phone) [phone] (Fax)   Agent: Please be advised that RX refills may take up to 3 business days. We ask that you follow-up with your pharmacy.

## 2018-07-09 NOTE — Telephone Encounter (Signed)
Pt calling to check status. Pt is completely out. Please advise

## 2018-07-10 NOTE — Telephone Encounter (Signed)
Last zolpidem RX:  Last OV: Next OV: UDS: CSC: CSR:

## 2018-08-03 ENCOUNTER — Other Ambulatory Visit: Payer: Self-pay | Admitting: Family Medicine

## 2018-08-03 DIAGNOSIS — G4700 Insomnia, unspecified: Secondary | ICD-10-CM

## 2018-08-03 NOTE — Telephone Encounter (Signed)
Requesting: Ambien  Contract: 09/30/2016 UDS:09/30/2016, low risk, next screen 09/30/2017 Last OV: 03/37/2020 Next OV: N/A Last Refill: 04/13/2018, #30--3 RF Database:   Please advise

## 2018-08-19 ENCOUNTER — Encounter: Payer: Self-pay | Admitting: Family Medicine

## 2018-08-19 ENCOUNTER — Other Ambulatory Visit: Payer: Self-pay

## 2018-08-19 ENCOUNTER — Ambulatory Visit (INDEPENDENT_AMBULATORY_CARE_PROVIDER_SITE_OTHER): Payer: 59 | Admitting: Family Medicine

## 2018-08-19 VITALS — Wt 158.0 lb

## 2018-08-19 DIAGNOSIS — H9202 Otalgia, left ear: Secondary | ICD-10-CM | POA: Diagnosis not present

## 2018-08-19 DIAGNOSIS — J029 Acute pharyngitis, unspecified: Secondary | ICD-10-CM | POA: Diagnosis not present

## 2018-08-19 MED ORDER — AMOXICILLIN 875 MG PO TABS: 875.0000 mg | ORAL_TABLET | Freq: Two times a day (BID) | ORAL | 0 refills | Status: DC

## 2018-08-19 NOTE — Progress Notes (Signed)
Virtual Visit via Video Note  I connected with Angela Bryant on 08/19/18 at  8:40 AM EDT by a video enabled telemedicine application and verified that I am speaking with the correct person using two identifiers.  Location: Patient: home  Provider: home    I discussed the limitations of evaluation and management by telemedicine and the availability of in person appointments. The patient expressed understanding and agreed to proceed.  History of Present Illness: Pt is home c/o L ear pain and sore throat since yesterday.   + congestion  X 2 days   + tired   Some bodyaches  Her manager had the same symptoms and was given abx   Observations/Objective No vitals obtained Pt states she has no fever Pt in nad  She has flonase from her allergy dr     Assessment and Plan: 1. Pharyngitis, unspecified etiology Cont all meds Take abx per orders If symptoms persist will test for covid  - amoxicillin (AMOXIL) 875 MG tablet; Take 1 tablet (875 mg total) by mouth 2 (two) times daily.  Dispense: 20 tablet; Refill: 0  2. Left ear pain abx per orders  con't flonase and other all meds  Call if symptoms persist or worsen  - amoxicillin (AMOXIL) 875 MG tablet; Take 1 tablet (875 mg total) by mouth 2 (two) times daily.  Dispense: 20 tablet; Refill: 0   Follow Up Instructions:    I discussed the assessment and treatment plan with the patient. The patient was provided an opportunity to ask questions and all were answered. The patient agreed with the plan and demonstrated an understanding of the instructions.   The patient was advised to call back or seek an in-person evaluation if the symptoms worsen or if the condition fails to improve as anticipated.  I provided 15 minutes of non-face-to-face time during this encounter.   Ann Held, DO

## 2018-08-20 ENCOUNTER — Telehealth: Payer: Self-pay | Admitting: Family Medicine

## 2018-08-20 ENCOUNTER — Other Ambulatory Visit: Payer: Self-pay | Admitting: Family Medicine

## 2018-08-20 ENCOUNTER — Other Ambulatory Visit: Payer: Self-pay

## 2018-08-20 ENCOUNTER — Encounter: Payer: Self-pay | Admitting: Family Medicine

## 2018-08-20 DIAGNOSIS — J40 Bronchitis, not specified as acute or chronic: Secondary | ICD-10-CM

## 2018-08-20 DIAGNOSIS — Z20822 Contact with and (suspected) exposure to covid-19: Secondary | ICD-10-CM

## 2018-08-20 MED ORDER — AMOXICILLIN 400 MG/5ML PO SUSR: ORAL | 0 refills | Status: DC

## 2018-08-20 NOTE — Telephone Encounter (Signed)
Order is iin --- she can go to one of the testing sites--- there is one in [place] or [place] too if [place] is closer than [place]

## 2018-08-20 NOTE — Telephone Encounter (Signed)
Patient contacted and given testing location.

## 2018-08-20 NOTE — Telephone Encounter (Signed)
Patient would like amoxicillin in liquid.  She states pills are too big.

## 2018-08-20 NOTE — Telephone Encounter (Signed)
Pt states she was given amoxacillin at her virtual visit yesterday and after getting medication it is too big for her to take.  Pt wants to know if she can have a liquid form of the medication called in so that she can take it. Pt uses  [facility] [address] [place], [place]. [phone] (Phone) [phone] (Fax)

## 2018-08-21 LAB — SPECIMEN STATUS REPORT

## 2018-08-21 LAB — NOVEL CORONAVIRUS, NAA: SARS-CoV-2, NAA: NOT DETECTED

## 2018-09-03 ENCOUNTER — Encounter: Payer: Self-pay | Admitting: Family Medicine

## 2018-09-03 ENCOUNTER — Ambulatory Visit: Payer: 59 | Admitting: Family Medicine

## 2018-09-03 ENCOUNTER — Other Ambulatory Visit: Payer: Self-pay

## 2018-09-03 ENCOUNTER — Ambulatory Visit (INDEPENDENT_AMBULATORY_CARE_PROVIDER_SITE_OTHER): Payer: 59 | Admitting: Family Medicine

## 2018-09-03 DIAGNOSIS — J302 Other seasonal allergic rhinitis: Secondary | ICD-10-CM | POA: Diagnosis not present

## 2018-09-03 DIAGNOSIS — J0141 Acute recurrent pansinusitis: Secondary | ICD-10-CM | POA: Diagnosis not present

## 2018-09-03 DIAGNOSIS — R002 Palpitations: Secondary | ICD-10-CM

## 2018-09-03 MED ORDER — FLUTICASONE PROPIONATE 50 MCG/ACT NA SUSP: 2.0000 | Freq: Every day | NASAL | 6 refills | Status: DC

## 2018-09-03 MED ORDER — AZITHROMYCIN 250 MG PO TABS: ORAL_TABLET | ORAL | 0 refills | Status: DC

## 2018-09-03 MED ORDER — CETIRIZINE HCL 10 MG PO TABS: 10.0000 mg | ORAL_TABLET | Freq: Every day | ORAL | 11 refills | Status: DC

## 2018-09-03 NOTE — Progress Notes (Signed)
Virtual Visit via Video Note  I connected with Angela Bryant on 09/03/18 at  9:20 AM EDT by a video enabled telemedicine application and verified that I am speaking with the correct person using two identifiers.  Location: Patient: home  Provider: office    I discussed the limitations of evaluation and management by telemedicine and the availability of in person appointments. The patient expressed understanding and agreed to proceed.  History of Present Illness: Pt is home c/o sinus congestion the returned 1 week ago.  She is taking all her allergy med and sudafed.   Her symptoms did go away with the last abx but then returned about 4 days after finishing abx  No fever covid test was neg  She also c/o palpitations every morning for the last few months.  No chest pain, no sob She is not needeing to use her inhalers.  Observations/Objective: .no vitals obtained Pt in NAD Assessment and Plan: 1. Seasonal allergies Stable con't singulair, zyrtec and flonase - cetirizine (ZYRTEC) 10 MG tablet; Take 1 tablet (10 mg total) by mouth daily.  Dispense: 30 tablet; Refill: 11 - fluticasone (FLONASE) 50 MCG/ACT nasal spray; Place 2 sprays into both nostrils daily.  Dispense: 16 g; Refill: 6  2. Acute recurrent pansinusitis abx  Cont flonase and zyrtec - azithromycin (ZITHROMAX Z-PAK) 250 MG tablet; As directed  Dispense: 6 each; Refill: 0  3. Palpitations Will have pt come into office next week for further evaluation Pt instructed to go to ER if symptoms worsen  Follow Up Instructions:    I discussed the assessment and treatment plan with the patient. The patient was provided an opportunity to ask questions and all were answered. The patient agreed with the plan and demonstrated an understanding of the instructions.   The patient was advised to call back or seek an in-person evaluation if the symptoms worsen or if the condition fails to improve as anticipated.  I provided 15 minutes  of non-face-to-face time during this encounter.   Ann Held, DO

## 2018-09-04 ENCOUNTER — Ambulatory Visit: Payer: 59 | Admitting: Family Medicine

## 2018-09-06 ENCOUNTER — Encounter: Payer: Self-pay | Admitting: Family Medicine

## 2018-09-07 ENCOUNTER — Other Ambulatory Visit: Payer: Self-pay | Admitting: Family Medicine

## 2018-09-07 ENCOUNTER — Other Ambulatory Visit: Payer: Self-pay

## 2018-09-07 DIAGNOSIS — G4700 Insomnia, unspecified: Secondary | ICD-10-CM

## 2018-09-07 NOTE — Telephone Encounter (Signed)
Requesting: Ambien Contract: 10/01/2016   UDS: 09/30/2016, low risk, next screen 09/2017 Last OV: 09/03/2018  Next OV: N/A Last Refill: 08/13/2018, #30--0 RF Database:   Please advise

## 2018-09-07 NOTE — Telephone Encounter (Signed)
We need new contract and uds  I will refill 1x

## 2018-09-11 ENCOUNTER — Ambulatory Visit: Payer: 59 | Admitting: Family Medicine

## 2018-09-11 ENCOUNTER — Ambulatory Visit (HOSPITAL_BASED_OUTPATIENT_CLINIC_OR_DEPARTMENT_OTHER)
Admission: RE | Admit: 2018-09-11 | Discharge: 2018-09-11 | Disposition: A | Discharge status: Home / Self Care | Payer: 59 | Source: Ambulatory Visit | Attending: Family Medicine | Admitting: Family Medicine

## 2018-09-11 ENCOUNTER — Encounter: Payer: Self-pay | Admitting: Family Medicine

## 2018-09-11 ENCOUNTER — Other Ambulatory Visit: Payer: Self-pay

## 2018-09-11 VITALS — BP 118/70 | HR 78 | Temp 97.3°F | Resp 18 | Ht 66.0 in | Wt 157.2 lb

## 2018-09-11 DIAGNOSIS — Z79899 Other long term (current) drug therapy: Secondary | ICD-10-CM | POA: Diagnosis not present

## 2018-09-11 DIAGNOSIS — R002 Palpitations: Secondary | ICD-10-CM | POA: Insufficient documentation

## 2018-09-11 LAB — CBC WITH DIFFERENTIAL/PLATELET
Basophils Absolute: 0 10*3/uL (ref 0.0–0.1)
Basophils Relative: 0.7 % (ref 0.0–3.0)
Eosinophils Absolute: 0.1 10*3/uL (ref 0.0–0.7)
Eosinophils Relative: 2.3 % (ref 0.0–5.0)
HCT: 38.4 % (ref 36.0–46.0)
Hemoglobin: 12.7 g/dL (ref 12.0–15.0)
Lymphocytes Relative: 27.8 % (ref 12.0–46.0)
Lymphs Abs: 1.8 10*3/uL (ref 0.7–4.0)
MCHC: 33.1 g/dL (ref 30.0–36.0)
MCV: 92.8 fl (ref 78.0–100.0)
Monocytes Absolute: 0.5 10*3/uL (ref 0.1–1.0)
Monocytes Relative: 7.7 % (ref 3.0–12.0)
Neutro Abs: 4 10*3/uL (ref 1.4–7.7)
Neutrophils Relative %: 61.5 % (ref 43.0–77.0)
Platelets: 194 10*3/uL (ref 150.0–400.0)
RBC: 4.14 Mil/uL (ref 3.87–5.11)
RDW: 13.7 % (ref 11.5–15.5)
WBC: 6.5 10*3/uL (ref 4.0–10.5)

## 2018-09-11 LAB — COMPREHENSIVE METABOLIC PANEL
ALT: 11 U/L (ref 0–35)
AST: 15 U/L (ref 0–37)
Albumin: 4.2 g/dL (ref 3.5–5.2)
Alkaline Phosphatase: 44 U/L (ref 39–117)
BUN: 8 mg/dL (ref 6–23)
CO2: 28 mEq/L (ref 19–32)
Calcium: 9.4 mg/dL (ref 8.4–10.5)
Chloride: 105 mEq/L (ref 96–112)
Creatinine, Ser: 0.69 mg/dL (ref 0.40–1.20)
GFR: 95.45 mL/min (ref >60.00)
Glucose, Bld: 82 mg/dL (ref 70–99)
Potassium: 4.3 mEq/L (ref 3.5–5.1)
Sodium: 139 mEq/L (ref 135–145)
Total Bilirubin: 0.4 mg/dL (ref 0.2–1.2)
Total Protein: 6.7 g/dL (ref 6.0–8.3)

## 2018-09-11 NOTE — Progress Notes (Signed)
Patient ID: Angela Bryant, female    DOB: 1980/12/21  Age: 38 y.o. MRN: [id number]    Subjective:  Subjective  HPI  Angela Bryant presents for palpitations x 2 months No sob  No chest pain   Review of Systems  Constitutional: Negative for appetite change, diaphoresis, fatigue and unexpected weight change.  Eyes: Negative for pain, redness and visual disturbance.  Respiratory: Negative for cough, chest tightness, shortness of breath and wheezing.   Cardiovascular: Positive for palpitations. Negative for chest pain and leg swelling.  Endocrine: Negative for cold intolerance, heat intolerance, polydipsia, polyphagia and polyuria.  Genitourinary: Negative for difficulty urinating, dysuria and frequency.  Neurological: Negative for dizziness, light-headedness, numbness and headaches.    History Past Medical History:  Diagnosis Date  . Anxiety   . Asthma   . GERD (gastroesophageal reflux disease)   . Insomnia   . Irritable bowel syndrome     She has a past surgical history that includes Anal Rectal manometry (N/A, 01/04/2015).   Her family history includes Asthma in her mother; Hypertension in her father.She reports that she has been smoking cigarettes. She has never used smokeless tobacco. She reports current alcohol use. She reports that she does not use drugs.  Current Outpatient Medications on File Prior to Visit  Medication Sig Dispense Refill  . ADVAIR DISKUS 500-50 MCG/DOSE AEPB INHALE 1 PUFF INTO THE LUNGS TWICE DAILY 60 each 2  . albuterol (PROVENTIL HFA;VENTOLIN HFA) 108 (90 Base) MCG/ACT inhaler Inhale 2 puffs into the lungs every 6 (six) hours as needed. Reported on 02/01/2015 1 Inhaler 2  . cetirizine (ZYRTEC) 10 MG tablet Take 1 tablet (10 mg total) by mouth daily. 30 tablet 11  . fluticasone (FLONASE) 50 MCG/ACT nasal spray Place 2 sprays into both nostrils daily. 16 g 6  . levonorgestrel (MIRENA) 20 MCG/24HR IUD 1 each by Intrauterine route once.    .  linaclotide (LINZESS) 72 MCG capsule Take 1 capsule (72 mcg total) by mouth daily before breakfast. 30 capsule 2  . montelukast (SINGULAIR) 10 MG tablet Take 1 tablet by mouth at bedtime.    Marland Kitchen omeprazole (PRILOSEC) 40 MG capsule Take 1 capsule (40 mg total) by mouth daily. 90 capsule 3  . zolpidem (AMBIEN) 10 MG tablet TAKE ONE TABLET BY MOUTH AT BEDTIME 30 tablet 0  . azithromycin (ZITHROMAX Z-PAK) 250 MG tablet As directed (Patient not taking: Reported on 09/11/2018) 6 each 0   No current facility-administered medications on file prior to visit.      Objective:  Objective  Physical Exam Vitals signs and nursing note reviewed.  Constitutional:      Appearance: She is well-developed.  HENT:     Head: Normocephalic and atraumatic.  Eyes:     Conjunctiva/sclera: Conjunctivae normal.  Neck:     Musculoskeletal: Normal range of motion and neck supple.     Thyroid: No thyromegaly.     Vascular: No carotid bruit or JVD.  Cardiovascular:     Rate and Rhythm: Normal rate and regular rhythm.     Heart sounds: Normal heart sounds. No murmur.  Pulmonary:     Effort: Pulmonary effort is normal. No respiratory distress.     Breath sounds: Normal breath sounds. No wheezing or rales.  Chest:     Chest wall: No tenderness.  Neurological:     Mental Status: She is alert and oriented to person, place, and time.    BP 118/70 (BP Location: Right Arm, Patient Position: Sitting,  Cuff Size: Normal)   Pulse 78   Temp (!) 97.3 F (36.3 C) (Temporal)   Resp 18   Ht 5\' 6"  (1.676 m)   Wt 157 lb 3.2 oz (71.3 kg)   LMP 08/12/2018   SpO2 98%   BMI 25.37 kg/m  Wt Readings from Last 3 Encounters:  09/11/18 157 lb 3.2 oz (71.3 kg)  08/19/18 158 lb (71.7 kg)  04/10/18 150 lb (68 kg)     Lab Results  Component Value Date   WBC 7.1 04/10/2018   HGB 13.8 04/10/2018   HCT 41.4 04/10/2018   PLT 223.0 04/10/2018   GLUCOSE 92 04/10/2018   ALT 10 04/10/2018   AST 13 04/10/2018   NA 139 04/10/2018    K 4.3 04/10/2018   CL 104 04/10/2018   CREATININE 0.69 04/10/2018   BUN 7 04/10/2018   CO2 27 04/10/2018   TSH 1.30 04/10/2018    Dg Chest 2 View  Result Date: 03/27/2018 CLINICAL DATA:  Cough. EXAM: CHEST - 2 VIEW COMPARISON:  Radiographs of December 02, 2017. FINDINGS: The heart size and mediastinal contours are within normal limits. Both lungs are clear. The visualized skeletal structures are unremarkable. IMPRESSION: No active cardiopulmonary disease. Electronically Signed   By: Lupita RaiderJames  Green Jr, M.D.   On: 03/27/2018 11:48     Assessment & Plan:  Plan  I am having Angela Bryant maintain her montelukast, levonorgestrel, omeprazole, linaclotide, Advair Diskus, albuterol, cetirizine, fluticasone, azithromycin, and zolpidem.  No orders of the defined types were placed in this encounter.   Problem List Items Addressed This Visit      Unprioritized   Palpitations - Primary    Check labs, cxr and event monitor Very likely anxiety related--- pt discussed getting very anxious if she runs out of her sleeping pills Gave pt a list of psych/ counselors to see--- she has tried several meds which she has had side effects to.        Relevant Orders   EKG 12-Lead (Completed)   Cardiac event monitor   CBC with Differential/Platelet   Comprehensive metabolic panel   Thyroid Panel With TSH   DG Chest 2 View    Other Visit Diagnoses    High risk medication use       Relevant Orders   Pain Mgmt, Profile 8 w/Conf, U      Follow-up: Return if symptoms worsen or fail to improve.  Donato SchultzYvonne R Lowne Chase, DO

## 2018-09-11 NOTE — Assessment & Plan Note (Signed)
Check labs, cxr and event monitor Very likely anxiety related--- pt discussed getting very anxious if she runs out of her sleeping pills Gave pt a list of psych/ counselors to see--- she has tried several meds which she has had side effects to.

## 2018-09-11 NOTE — Patient Instructions (Signed)

## 2018-09-12 ENCOUNTER — Encounter: Payer: Self-pay | Admitting: Family Medicine

## 2018-09-12 LAB — PAIN MGMT, PROFILE 8 W/CONF, U
6 Acetylmorphine: NEGATIVE ng/mL
Alcohol Metabolites: NEGATIVE ng/mL (ref <500)
Amphetamines: NEGATIVE ng/mL
Benzodiazepines: NEGATIVE ng/mL
Buprenorphine, Urine: NEGATIVE ng/mL
Cocaine Metabolite: NEGATIVE ng/mL
Creatinine: 43.6 mg/dL
MDMA: NEGATIVE ng/mL
Marijuana Metabolite: NEGATIVE ng/mL
Opiates: NEGATIVE ng/mL
Oxidant: NEGATIVE ug/mL
Oxycodone: NEGATIVE ng/mL
pH: 7.1 (ref 4.5–9.0)

## 2018-09-12 LAB — THYROID PANEL WITH TSH
Free Thyroxine Index: 2.3 (ref 1.4–3.8)
T3 Uptake: 32 % (ref 22–35)
T4, Total: 7.1 ug/dL (ref 5.1–11.9)
TSH: 1.29 mIU/L

## 2018-09-14 ENCOUNTER — Other Ambulatory Visit: Payer: Self-pay | Admitting: Family Medicine

## 2018-09-14 DIAGNOSIS — R002 Palpitations: Secondary | ICD-10-CM

## 2018-09-14 NOTE — Telephone Encounter (Signed)
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

## 2018-09-18 NOTE — Telephone Encounter (Signed)
I put a note in my chart--- she should be able to print it at home

## 2018-09-21 NOTE — Progress Notes (Signed)
Cardiology Office Note:    Date:  09/22/2018   ID:  Angela PoundStephanie F Duran, DOB 01/13/1981, MRN [id number]  PCP:  Zola ButtonLowne Chase, Grayling CongressYvonne R, DO  Cardiologist:  Thomasene RippleKardie Schelly Chuba, DO  Electrophysiologist:  None   Referring MD: Zola ButtonLowne Chase, Grayling CongressYvonne R, *  Chief Complaints: Palpitations  History of Present Illness:    Angela Bryant is a 38 y.o. female with a past medical history of Anxiety, Asthma, GERD and insomnia presents to be evaluated for palpitations.  Per patient she has been experiencing palpitation for the last month.  She notes that ever since her job description change and she has to go to other stores she has had increasing palpitations.  She describes this palpitation as an abrupt onset of fast heartbeat which lasts sometimes 10 to 15 minutes.  She notes that she does get some relief from the Valium that was prescribed by her primary care physician.  She denies any dizziness, lightheadedness, chest pain or shortness of breath.  She states that she most frequently have these episodes first thing in the morning and sometimes are noted today.  She reports experiencing this this morning prior to her visit.    She is calm in the office this morning and is not experiencing any palpitations.  She also shared with me that [place].  No other complaints at this time.   Past Medical History:  Diagnosis Date  . Anxiety   . Asthma   . GERD (gastroesophageal reflux disease)   . Insomnia   . Irritable bowel syndrome     Past Surgical History:  Procedure Laterality Date  . ANAL RECTAL MANOMETRY N/A 01/04/2015   Procedure: ANO RECTAL MANOMETRY;  Surgeon: Napoleon FormKavitha Nandigam V, MD;  Location: [facility];  Service: Endoscopy;  Laterality: N/A;    Current Medications: Current Meds  Medication Sig  . ADVAIR DISKUS 500-50 MCG/DOSE AEPB INHALE 1 PUFF INTO THE LUNGS TWICE DAILY  . albuterol (PROVENTIL HFA;VENTOLIN HFA) 108 (90 Base) MCG/ACT inhaler Inhale 2 puffs into the  lungs every 6 (six) hours as needed. Reported on 02/01/2015  . cetirizine (ZYRTEC) 10 MG tablet Take 1 tablet (10 mg total) by mouth daily.  . diazepam (VALIUM) 5 MG tablet 5 mg as needed.  . fluticasone (FLONASE) 50 MCG/ACT nasal spray Place 2 sprays into both nostrils daily.  Marland Kitchen. levonorgestrel (MIRENA) 20 MCG/24HR IUD 1 each by Intrauterine route once.  . linaclotide (LINZESS) 72 MCG capsule Take 1 capsule (72 mcg total) by mouth daily before breakfast.  . montelukast (SINGULAIR) 10 MG tablet Take 1 tablet by mouth at bedtime.  Marland Kitchen. omeprazole (PRILOSEC) 40 MG capsule Take 1 capsule (40 mg total) by mouth daily.  Marland Kitchen. zolpidem (AMBIEN) 10 MG tablet TAKE ONE TABLET BY MOUTH AT BEDTIME     Allergies:   Codeine, Omnicef [cefdinir], and Other   Social History   Socioeconomic History  . Marital status: Married    Spouse name: Not on file  . Number of children: 0  . Years of education: Not on file  . Highest education level: Not on file  Occupational History  . Occupation: [employer]    Employer: [employer]  Social Needs  . Financial resource strain: Not on file  . Food insecurity    Worry: Not on file    Inability: Not on file  . Transportation needs    Medical: Not on file    Non-medical: Not on file  Tobacco  Use  . Smoking status: Light Tobacco Smoker    Types: Cigarettes  . Smokeless tobacco: Never Used  . Tobacco comment: rarely has a cigarrette, sometimes on weekends  Substance and Sexual Activity  . Alcohol use: Yes    Alcohol/week: 0.0 standard drinks    Comment: occasional   . Drug use: No  . Sexual activity: Yes    Partners: Male  Lifestyle  . Physical activity    Days per week: Not on file    Minutes per session: Not on file  . Stress: Not on file  Relationships  . Social Herbalist on phone: Not on file    Gets together: Not on file    Attends religious service: Not on file    Active member of club or organization: Not on file    Attends meetings of clubs  or organizations: Not on file    Relationship status: Not on file  Other Topics Concern  . Not on file  Social History Narrative  . Not on file     Family History: The patient's family history includes Asthma in her mother; Hypertension in her father.  ROS:   Please see the history of present illness.   Review of Systems  Constitution: Negative for fever and weight loss.  HENT: Positive for congestion (due to seasonal allergies). Negative for ear pain, hearing loss and sore throat.   Eyes: Positive for redness. Negative for double vision.  Cardiovascular: Positive for palpitations. Negative for chest pain, leg swelling and paroxysmal nocturnal dyspnea.  Respiratory: Negative for cough, shortness of breath and wheezing.   Gastrointestinal: Negative for heartburn, nausea and vomiting.   Review of Systems  Constitutional: Negative for fever and weight loss.  HENT: Positive for congestion (due to seasonal allergies). Negative for ear pain, hearing loss and sore throat.   Eyes: Positive for redness. Negative for double vision.  Respiratory: Negative for cough, shortness of breath and wheezing.   Cardiovascular: Positive for palpitations. Negative for chest pain, leg swelling and PND.  Gastrointestinal: Negative for heartburn, nausea and vomiting.    EKGs/Labs/Other Studies Reviewed:    The following studies were reviewed today:  EKG:  EKG is ordered today.  The ekg ordered today demonstrates evidence of sinus rhythm, HR of 75bpm with nonspecific interventricular conduction defect similar to previous.  Chest X-ray 09/11/2018:  FINDINGS: The heart size and mediastinal contours are within normal limits. Both lungs are clear. No pneumothorax or pleural effusion is noted. The visualized skeletal structures are unremarkable.  IMPRESSION: No active cardiopulmonary disease.  Recent Labs: 09/11/2018: ALT 11; BUN 8; Creatinine, Ser 0.69; Hemoglobin 12.7; Platelets 194.0; Potassium 4.3;  Sodium 139; TSH 1.29  Recent Lipid Panel No results found for: CHOL, TRIG, HDL, CHOLHDL, VLDL, LDLCALC, LDLDIRECT  Physical Exam:    VS:  BP 102/82 (BP Location: Left Arm, Patient Position: Sitting, Cuff Size: Normal)   Pulse 75   Temp 97.9 F (36.6 C)   Ht 5\' 6"  (1.676 m)   Wt 156 lb (70.8 kg)   SpO2 97%   BMI 25.18 kg/m     Wt Readings from Last 3 Encounters:  09/22/18 156 lb (70.8 kg)  09/11/18 157 lb 3.2 oz (71.3 kg)  08/19/18 158 lb (71.7 kg)     GEN: Well nourished, well developed in no acute distress HEENT: Normal NECK: No JVD; No carotid bruits LYMPHATICS: No lymphadenopathy CARDIAC: S1-S2 noted.  RRR, no murmurs, rubs, gallops RESPIRATORY:  Clear to auscultation  without rales, wheezing or rhonchi  ABDOMEN: Soft, non-tender, non-distended EXTREMITIES: No edema no cyanosis no clubbing. MUSCULOSKELETAL:  No edema; No deformity  SKIN: Warm and dry NEUROLOGIC:  Alert and oriented x 3, nonfocal PSYCHIATRIC:  Normal affect , good insight.  ASSESSMENT:    1. Palpitations   2. Tobacco use    PLAN:    Mrs. Stokely was seen in the office today for palpitations.  At this time I discussed with patient in detail that I believe her palpitations may be in the setting of her anxiety attack.  However, ambulatory monitoring will be appropriate to rule out any arrhythmias.  In addition a 2D echocardiogram would be appropriate to assess for any structural abnormalities, and to assess LV function. She was encouraged to continue with her Valium.   The patient was also encouraged to quit smoking, however she reports she is a lesiure smoker and did not make any co The patient is in agreement with the above plan.  Follow-up in 1 month.   Medication Adjustments/Labs and Tests Ordered: Current medicines are reviewed at length with the patient today.  Concerns regarding medicines are outlined above.  Orders Placed This Encounter  Procedures  . LONG TERM MONITOR (3-14 DAYS)  . EKG  12-Lead  . ECHOCARDIOGRAM COMPLETE   No orders of the defined types were placed in this encounter.   Patient Instructions  Medication Instructions:  Your physician recommends that you continue on your current medications as directed. Please refer to the Current Medication list given to you today.  If you need a refill on your cardiac medications before your next appointment, please call your pharmacy.   Lab work: NOne If you have labs (blood work) drawn today and your tests are completely normal, you will receive your results only by: Marland Kitchen MyChart Message (if you have MyChart) OR . A paper copy in the mail If you have any lab test that is abnormal or we need to change your treatment, we will call you to review the results.  Testing/Procedures: Your physician has requested that you have an echocardiogram. Echocardiography is a painless test that uses sound waves to create images of your heart. It provides your doctor with information about the size and shape of your heart and how well your heart's chambers and valves are working. This procedure takes approximately one hour. There are no restrictions for this procedure.  Your physician has recommended that you wear a ZIO monitor. ZIO monitors are medical devices that record the heart's electrical activity. Doctors most often use these monitors to diagnose arrhythmias. Arrhythmias are problems with the speed or rhythm of the heartbeat. The monitor is a small, portable device. You can wear one while you do your normal daily activities. This is usually used to diagnose what is causing palpitations/syncope (passing out).  Wear 14 days  Follow-Up: At [facility], you and your health needs are our priority.  As part of our continuing mission to provide you with exceptional heart care, we have created designated Provider Care Teams.  These Care Teams include your primary Cardiologist (physician) and Advanced Practice Providers (APPs -  Physician  Assistants and Nurse Practitioners) who all work together to provide you with the care you need, when you need it. You will need a follow up appointment in 1 months With Dr Servando Salina. Any Other Special Instructions Will Be Listed Below (If Applicable).       Osvaldo Shipper, DO  09/22/2018 12:14 PM    [facility]

## 2018-09-22 ENCOUNTER — Ambulatory Visit (INDEPENDENT_AMBULATORY_CARE_PROVIDER_SITE_OTHER): Payer: 59 | Admitting: Cardiology

## 2018-09-22 ENCOUNTER — Encounter: Payer: Self-pay | Admitting: Cardiology

## 2018-09-22 ENCOUNTER — Other Ambulatory Visit: Payer: Self-pay

## 2018-09-22 ENCOUNTER — Telehealth: Payer: Self-pay | Admitting: Family Medicine

## 2018-09-22 VITALS — BP 102/82 | HR 75 | Temp 97.9°F | Ht 66.0 in | Wt 156.0 lb

## 2018-09-22 DIAGNOSIS — Z720 Tobacco use: Secondary | ICD-10-CM | POA: Diagnosis not present

## 2018-09-22 DIAGNOSIS — R002 Palpitations: Secondary | ICD-10-CM

## 2018-09-22 NOTE — Telephone Encounter (Signed)
Forms received. Forms placed to be signed for [facility]

## 2018-09-22 NOTE — Patient Instructions (Addendum)
Medication Instructions:  Your physician recommends that you continue on your current medications as directed. Please refer to the Current Medication list given to you today.  If you need a refill on your cardiac medications before your next appointment, please call your pharmacy.   Lab work: NOne If you have labs (blood work) drawn today and your tests are completely normal, you will receive your results only by: Marland Kitchen MyChart Message (if you have MyChart) OR . A paper copy in the mail If you have any lab test that is abnormal or we need to change your treatment, we will call you to review the results.  Testing/Procedures: Your physician has requested that you have an echocardiogram. Echocardiography is a painless test that uses sound waves to create images of your heart. It provides your doctor with information about the size and shape of your heart and how well your heart's chambers and valves are working. This procedure takes approximately one hour. There are no restrictions for this procedure.  Your physician has recommended that you wear a ZIO monitor. ZIO monitors are medical devices that record the heart's electrical activity. Doctors most often use these monitors to diagnose arrhythmias. Arrhythmias are problems with the speed or rhythm of the heartbeat. The monitor is a small, portable device. You can wear one while you do your normal daily activities. This is usually used to diagnose what is causing palpitations/syncope (passing out).  Wear 14 days  Follow-Up: At [facility], you and your health needs are our priority.  As part of our continuing mission to provide you with exceptional heart care, we have created designated Provider Care Teams.  These Care Teams include your primary Cardiologist (physician) and Advanced Practice Providers (APPs -  Physician Assistants and Nurse Practitioners) who all work together to provide you with the care you need, when you need it. You will need a  follow up appointment in 1 months With Dr Harriet Masson. Any Other Special Instructions Will Be Listed Below (If Applicable).

## 2018-09-22 NOTE — Telephone Encounter (Signed)
Pt dropped off document to be filled out by provider Hedwig Morton- 10 pages)n Pt would like document to be faxed to [phone] when done and also to call pt to let her know when document sent threw fax at pt tel  [phone]. Document put at front office tray under providers name.

## 2018-09-23 NOTE — Telephone Encounter (Signed)
Please see below. Please advise.

## 2018-09-23 NOTE — Telephone Encounter (Signed)
Was she wanting period of time off or just fmla to make sure she can go to dr app without losing her job Also -- did she make app with psych yet--- I need to write who app is with on the form

## 2018-09-23 NOTE — Telephone Encounter (Signed)
Pt calling back and states that she would needs to take off for 30 days so that she can wear the heart monitor and testing. Pt states that she is having an ultrasound tomorrow. Pt states that she would like to take the 30 days so that she can schedule Psych appointment then. Pt has not made that appointment yet.  Pt states that she would like the leave of absence. Pt would like a call back regarding. Pt states that she needs paperwork filled out as soon as possible.    Cb#[phone]

## 2018-09-24 ENCOUNTER — Other Ambulatory Visit: Payer: Self-pay

## 2018-09-24 ENCOUNTER — Ambulatory Visit (HOSPITAL_BASED_OUTPATIENT_CLINIC_OR_DEPARTMENT_OTHER): Payer: 59

## 2018-09-24 ENCOUNTER — Ambulatory Visit (HOSPITAL_BASED_OUTPATIENT_CLINIC_OR_DEPARTMENT_OTHER)
Admission: RE | Admit: 2018-09-24 | Discharge: 2018-09-24 | Disposition: A | Discharge status: Home / Self Care | Payer: 59 | Source: Ambulatory Visit | Attending: Cardiology | Admitting: Cardiology

## 2018-09-24 DIAGNOSIS — R002 Palpitations: Secondary | ICD-10-CM | POA: Diagnosis present

## 2018-09-24 NOTE — Telephone Encounter (Signed)
done

## 2018-09-24 NOTE — Progress Notes (Signed)
  Echocardiogram 2D Echocardiogram has been performed.  Angela Bryant 09/24/2018, 10:50 AM

## 2018-09-25 ENCOUNTER — Telehealth: Payer: Self-pay | Admitting: *Deleted

## 2018-09-25 NOTE — Telephone Encounter (Signed)
Telephone call to patient. Informed of echo results. No further questions.  

## 2018-09-26 ENCOUNTER — Other Ambulatory Visit: Payer: Self-pay | Admitting: Family Medicine

## 2018-09-26 DIAGNOSIS — K219 Gastro-esophageal reflux disease without esophagitis: Secondary | ICD-10-CM

## 2018-09-28 ENCOUNTER — Telehealth: Payer: Self-pay

## 2018-09-28 ENCOUNTER — Ambulatory Visit (INDEPENDENT_AMBULATORY_CARE_PROVIDER_SITE_OTHER): Payer: 59

## 2018-09-28 DIAGNOSIS — R002 Palpitations: Secondary | ICD-10-CM

## 2018-09-28 NOTE — Telephone Encounter (Signed)
Copied from [place] [phone]. Topic: General - Inquiry >> Sep 28, 2018  1:30 PM Scherrie Gerlach wrote: Reason for CRM: pt states her FMLA paperwork was received and approved, however her thought was she was to be out at least 30 days from today.  She cannot see a counselor until 10/06. Pt states the paperwork Dr Sherrine Maples sent in was to return to work 9/25.  This will not work and she is confused.  Would like a call back to discuss.

## 2018-09-28 NOTE — Telephone Encounter (Signed)
When we filled out the paper work she did not have an app with psych yet--- because I asked when she was seeing them  Ok to addend

## 2018-09-30 DIAGNOSIS — G4700 Insomnia, unspecified: Secondary | ICD-10-CM

## 2018-09-30 NOTE — Telephone Encounter (Signed)
Requesting: Ambien Contract: 09/11/2018 UDS: 09/11/2018, low risk Last OV: 09/11/2018 Next OV: N/A Last Refill: 09/07/2018, #30--0 RF Database:   Please advise

## 2018-10-01 ENCOUNTER — Telehealth: Payer: Self-pay | Admitting: Cardiology

## 2018-10-01 NOTE — Telephone Encounter (Signed)
Called patient back. She reports since wearing the monitor she continues to itch and be irritated at the site of placement. She has tried benadryl with no relief and has had many skin and allergic reaction issues in the past so benadryl doesn't work very well for her anymore. She has worn for 4 days. Will consult with Dr. Harriet Masson for further instructions.

## 2018-10-01 NOTE — Telephone Encounter (Signed)
New message   Patient is having issues with her heart monitor. Patient is having a lot of itching and has sensitive skin. Please call to discuss.

## 2018-10-05 ENCOUNTER — Other Ambulatory Visit: Payer: Self-pay | Admitting: Family Medicine

## 2018-10-05 ENCOUNTER — Encounter: Payer: Self-pay | Admitting: Family Medicine

## 2018-10-05 DIAGNOSIS — G4700 Insomnia, unspecified: Secondary | ICD-10-CM

## 2018-10-05 NOTE — Telephone Encounter (Signed)
Okay for her to remove the monitor.

## 2018-10-05 NOTE — Telephone Encounter (Signed)
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

## 2018-10-05 NOTE — Telephone Encounter (Signed)
Telephone call to patient. Left message that she may take off the monitor and mail it in. She is to call with any questions.

## 2018-10-06 NOTE — Telephone Encounter (Signed)
Patient is calling to check the status of her refill request.  She stated she put the request in a week ago and the pharmacy still does not have the medication.  Please advise.

## 2018-10-06 NOTE — Telephone Encounter (Signed)
Requesting: Ambien Contract: 09/11/18  UDS: 09/11/2018 Last OV: 09/11/2018 Next OV: N/A Last Refill: 09/07/2018, #30--0 RF Database:   Please advise

## 2018-10-13 NOTE — Telephone Encounter (Signed)
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

## 2018-10-18 NOTE — Progress Notes (Signed)
Cardiology Office Note:    Date:  10/19/2018   ID:  Angela Bryant, DOB [birth year], MRN [id number]  PCP:  Carollee Herter, Alferd Apa, DO  Cardiologist:  Berniece Salines, DO  Electrophysiologist:  None   Referring MD: Carollee Herter, Alferd Apa, *   Follow up visit.  History of Present Illness:    Angela Bryant is a 38 y.o. female with a hx of  Anxiety, Asthma, GERD and insomnia presents to be evaluated for palpitations on 09/22/2018. At the conclusion of her visit, an ambulatory monitor was ordered as well as a 2D echocardiogram was ordered. She was also asked to continue her valium.   In the interim the patient did get the TTE which was within normal limits.   Today she reports that her symptoms have improved.  She notes that she also pain in her Valium may be helping as one night she woke up with spell of her heart pounding when she took her Valium that helped resolve it and she was able to go back to sleep.  Her PCP also recommended that she undergo therapy for anxiety which she has been doing every other week now seems to be helping.  Past Medical History:  Diagnosis Date   Anxiety    Asthma    GERD (gastroesophageal reflux disease)    Insomnia    Irritable bowel syndrome     Past Surgical History:  Procedure Laterality Date   ANAL RECTAL MANOMETRY N/A 01/04/2015   Procedure: ANO RECTAL MANOMETRY;  Surgeon: Mauri Pole, MD;  Location: [facility];  Service: Endoscopy;  Laterality: N/A;    Current Medications: Current Meds  Medication Sig   ADVAIR DISKUS 500-50 MCG/DOSE AEPB INHALE 1 PUFF INTO THE LUNGS TWICE DAILY   albuterol (PROVENTIL HFA;VENTOLIN HFA) 108 (90 Base) MCG/ACT inhaler Inhale 2 puffs into the lungs every 6 (six) hours as needed. Reported on 02/01/2015   cetirizine (ZYRTEC) 10 MG tablet Take 1 tablet (10 mg total) by mouth daily.   diazepam (VALIUM) 5 MG tablet 5 mg as needed.   fluticasone (FLONASE) 50 MCG/ACT nasal spray Place 2 sprays into  both nostrils daily.   levonorgestrel (MIRENA) 20 MCG/24HR IUD 1 each by Intrauterine route once.   linaclotide (LINZESS) 72 MCG capsule Take 1 capsule (72 mcg total) by mouth daily before breakfast.   montelukast (SINGULAIR) 10 MG tablet Take 1 tablet by mouth at bedtime.   omeprazole (PRILOSEC) 40 MG capsule TAKE ONE CAPSULE BY MOUTH ONE TIME DAILY   zolpidem (AMBIEN) 10 MG tablet TAKE ONE TABLET BY MOUTH AT BEDTIME     Allergies:   Codeine, Omnicef [cefdinir], and Other   Social History   Socioeconomic History   Marital status: Married    Spouse name: Not on file   Number of children: 0   Years of education: Not on file   Highest education level: Not on file  Occupational History   Occupation: t Environmental manager: [employer]  Social Designer, fashion/clothing strain: Not on file   Food insecurity    Worry: Not on file    Inability: Not on file   Transportation needs    Medical: Not on file    Non-medical: Not on file  Tobacco Use   Smoking status: Light Tobacco Smoker    Types: Cigarettes   Smokeless tobacco: Never Used   Tobacco comment: rarely has a cigarrette, sometimes on weekends  Substance and Sexual Activity  Alcohol use: Yes    Alcohol/week: 0.0 standard drinks    Comment: occasional    Drug use: No   Sexual activity: Yes    Partners: Male  Lifestyle   Physical activity    Days per week: Not on file    Minutes per session: Not on file   Stress: Not on file  Relationships   Social connections    Talks on phone: Not on file    Gets together: Not on file    Attends religious service: Not on file    Active member of club or organization: Not on file    Attends meetings of clubs or organizations: Not on file    Relationship status: Not on file  Other Topics Concern   Not on file  Social History Narrative   Not on file     Family History: The patient's family history includes Asthma in her mother; Hypertension in her  father.  ROS:   Review of Systems  Constitution: Negative for decreased appetite, fever and weight gain.  HENT: Negative for congestion, ear discharge, hoarse voice and sore throat.   Eyes: Negative for discharge, redness, vision loss in right eye and visual halos.  Cardiovascular: Reports intermittent palpitations.  Negative for chest pain, dyspnea on exertion, leg swelling, orthopnea respiratory: Negative for cough, hemoptysis, shortness of breath and snoring.   Endocrine: Negative for heat intolerance and polyphagia.  Hematologic/Lymphatic: Negative for bleeding problem. Does not bruise/bleed easily.  Skin: Negative for flushing, nail changes, rash and suspicious lesions.  Musculoskeletal: Negative for arthritis, joint pain, muscle cramps, myalgias, neck pain and stiffness.  Gastrointestinal: Negative for abdominal pain, bowel incontinence, diarrhea and excessive appetite.  Genitourinary: Negative for decreased libido, genital sores and incomplete emptying.  Neurological: Negative for brief paralysis, focal weakness, headaches and loss of balance.  Psychiatric/Behavioral: Negative for altered mental status, depression and suicidal ideas.  Allergic/Immunologic: Negative for HIV exposure and persistent infections.    EKGs/Labs/Other Studies Reviewed:    The following studies were reviewed today:   EKG:  None today.  TTE IMPRESSIONS 09/24/2018  1. The left ventricle has normal systolic function with an ejection fraction of 60-65%. The cavity size was normal. Left ventricular diastolic Doppler parameters are consistent with impaired relaxation.  2. The right ventricle has normal systolic function. The cavity was normal. There is no increase in right ventricular wall thickness.  3. The aortic root and ascending aorta are normal in size and structure.  4. No evidence of mitral valve stenosis.  5. The aortic valve is tricuspid. No stenosis of the aortic valve.   Recent Labs: 09/11/2018:  ALT 11; BUN 8; Creatinine, Ser 0.69; Hemoglobin 12.7; Platelets 194.0; Potassium 4.3; Sodium 139; TSH 1.29  Recent Lipid Panel No results found for: CHOL, TRIG, HDL, CHOLHDL, VLDL, LDLCALC, LDLDIRECT  Physical Exam:    VS:  BP 110/82 (BP Location: Left Arm, Patient Position: Sitting, Cuff Size: Normal)    Pulse 79    Temp 97.7 F (36.5 C)    Ht  (1.676 m)    Wt 157 lb (71.2 kg)    SpO2 99%    BMI 25.34 kg/m     Wt Readings from Last 3 Encounters:  10/19/18 157 lb (71.2 kg)  09/22/18 156 lb (70.8 kg)  09/11/18 157 lb 3.2 oz (71.3 kg)     GEN: Well nourished, well developed in no acute distress HEENT: Normal NECK: No JVD; No carotid bruits LYMPHATICS: No lymphadenopathy CARDIAC: S1S2 noted,RRR,  no murmurs, rubs, gallops RESPIRATORY:  Clear to auscultation without rales, wheezing or rhonchi  ABDOMEN: Soft, non-tender, non-distended, +bowel sounds, no guarding. EXTREMITIES: No edema, No cyanosis, no clubbing MUSCULOSKELETAL:  No edema; No deformity  SKIN: Warm and dry NEUROLOGIC:  Alert and oriented x 3, non-focal PSYCHIATRIC:  Normal affect, good insight  ASSESSMENT:    1. Palpitations    PLAN:    1.  It was a pleasure seeing Angela Bryant today.  Also with a review all of her testing with her.  Her echocardiogram was normal.  In addition her ZIO Patch for which she wore for 6 days showed rare PACs.  With her dominant underlying rhythm being sinus.  There were no evidence of arrhythmia on her ZIO.  This was discussed with patient.  I do suspect that given the multiple incidence of sinus tachycardia anxiety is playing a role here.  I have advised patient to continue on her Valium as well as to continue working with her therapist as these measures seems to be helping.  The patient is in agreement with the above plan. The patient left the office in stable condition.  The patient will follow up in 6 months or earlier if needed.   Medication Adjustments/Labs and Tests  Ordered: Current medicines are reviewed at length with the patient today.  Concerns regarding medicines are outlined above.  No orders of the defined types were placed in this encounter.  No orders of the defined types were placed in this encounter.   Patient Instructions  Medication Instructions:  Your physician recommends that you continue on your current medications as directed. Please refer to the Current Medication list given to you today.  If you need a refill on your cardiac medications before your next appointment, please call your pharmacy.   Lab work: None If you have labs (blood work) drawn today and your tests are completely normal, you will receive your results only by:  MyChart Message (if you have MyChart) OR  A paper copy in the mail If you have any lab test that is abnormal or we need to change your treatment, we will call you to review the results.  Testing/Procedures: NOne  Follow-Up: At [facility], you and your health needs are our priority.  As part of our continuing mission to provide you with exceptional heart care, we have created designated Provider Care Teams.  These Care Teams include your primary Cardiologist (physician) and Advanced Practice Providers (APPs -  Physician Assistants and Nurse Practitioners) who all work together to provide you with the care you need, when you need it.  You will need a follow up appointment in 6 months.  Please call our office 2 months in advance to schedule this appointment.  You may see Thomasene Ripple, DO  Any Other Special Instructions Will Be Listed Below (If Applicable).      Adopting a Healthy Lifestyle.  Know what a healthy weight is for you (roughly BMI <25) and aim to maintain this   Aim for 7+ servings of fruits and vegetables daily   65-80+ fluid ounces of water or unsweet tea for healthy kidneys   Limit to max 1 drink of alcohol per day; avoid smoking/tobacco   Limit animal fats in diet for  cholesterol and heart health - choose grass fed whenever available   Avoid highly processed foods, and foods high in saturated/trans fats   Aim for low stress - take time to unwind and care for your mental health  Aim for 150 min of moderate intensity exercise weekly for heart health, and weights twice weekly for bone health   Aim for 7-9 hours of sleep daily   When it comes to diets, agreement about the perfect plan isnt easy to find, even among the experts. Experts at the [employer] developed an idea known as the Healthy Eating Plate. Just imagine a plate divided into logical, healthy portions.   The emphasis is on diet quality:   Load up on vegetables and fruits - one-half of your plate: Aim for color and variety, and remember that potatoes dont count.   Go for whole grains - one-quarter of your plate: Whole wheat, barley, wheat berries, quinoa, oats, brown rice, and foods made with them. If you want pasta, go with whole wheat pasta.   Protein power - one-quarter of your plate: Fish, chicken, beans, and nuts are all healthy, versatile protein sources. Limit red meat.   The diet, however, does go beyond the plate, offering a few other suggestions.   Use healthy plant oils, such as olive, canola, soy, corn, sunflower and peanut. Check the labels, and avoid partially hydrogenated oil, which have unhealthy trans fats.   If youre thirsty, drink water. Coffee and tea are good in moderation, but skip sugary drinks and limit milk and dairy products to one or two daily servings.   The type of carbohydrate in the diet is more important than the amount. Some sources of carbohydrates, such as vegetables, fruits, whole grains, and beans-are healthier than others.   Finally, stay active  Signed, Thomasene RippleKardie Kehaulani Fruin, DO  10/19/2018 9:25 AM    [facility]

## 2018-10-19 ENCOUNTER — Encounter: Payer: Self-pay | Admitting: Cardiology

## 2018-10-19 ENCOUNTER — Other Ambulatory Visit: Payer: Self-pay

## 2018-10-19 ENCOUNTER — Telehealth: Payer: Self-pay

## 2018-10-19 ENCOUNTER — Ambulatory Visit (INDEPENDENT_AMBULATORY_CARE_PROVIDER_SITE_OTHER): Payer: 59 | Admitting: Cardiology

## 2018-10-19 VITALS — BP 110/82 | HR 79 | Temp 97.7°F | Ht 66.0 in | Wt 157.0 lb

## 2018-10-19 DIAGNOSIS — R002 Palpitations: Secondary | ICD-10-CM | POA: Diagnosis not present

## 2018-10-19 NOTE — Telephone Encounter (Signed)
The cardiologist actually did not put in her chart to stay out of work another month----  I need a note from her counselor to that fact and if she is to stay on valium ---- she can not have ambien also---  The card believe its the anxiety causing her symptoms so we may need to get a psychiatrist involved to if her symptoms persist

## 2018-10-19 NOTE — Patient Instructions (Signed)
Medication Instructions:  Your physician recommends that you continue on your current medications as directed. Please refer to the Current Medication list given to you today.  If you need a refill on your cardiac medications before your next appointment, please call your pharmacy.   Lab work: None If you have labs (blood work) drawn today and your tests are completely normal, you will receive your results only by: Marland Kitchen MyChart Message (if you have MyChart) OR . A paper copy in the mail If you have any lab test that is abnormal or we need to change your treatment, we will call you to review the results.  Testing/Procedures: NOne  Follow-Up: At [facility], you and your health needs are our priority.  As part of our continuing mission to provide you with exceptional heart care, we have created designated Provider Care Teams.  These Care Teams include your primary Cardiologist (physician) and Advanced Practice Providers (APPs -  Physician Assistants and Nurse Practitioners) who all work together to provide you with the care you need, when you need it. . You will need a follow up appointment in 6 months.  Please call our office 2 months in advance to schedule this appointment.  You may see Berniece Salines, DO  Any Other Special Instructions Will Be Listed Below (If Applicable).

## 2018-10-19 NOTE — Telephone Encounter (Signed)
Copied from [place] [phone]. Topic: General - Inquiry >> Oct 19, 2018 12:44 PM Rutherford Nail, NT wrote: Reason for CRM:  Please see MyChart Message from 10/05/2018. Patient calling to check status of getting a work note to approve her Leave of Absence from work for another 30 days. Please advise.  CB#: [phone]

## 2018-10-20 ENCOUNTER — Telehealth: Payer: Self-pay

## 2018-10-20 NOTE — Telephone Encounter (Signed)
FMLA paperwork completed and faxed. Received confirmation.

## 2018-10-20 NOTE — Telephone Encounter (Signed)
Sent patient a Mychart message.

## 2018-10-20 NOTE — Telephone Encounter (Signed)
It was done--- addresses , phone , fax etc need to be filled in but its filled out on your desk

## 2018-10-21 ENCOUNTER — Encounter: Payer: Self-pay | Admitting: *Deleted

## 2018-10-28 ENCOUNTER — Other Ambulatory Visit: Payer: Self-pay

## 2018-10-28 ENCOUNTER — Telehealth: Payer: Self-pay | Admitting: Cardiology

## 2018-10-28 ENCOUNTER — Encounter: Payer: Self-pay | Admitting: Family Medicine

## 2018-10-28 ENCOUNTER — Ambulatory Visit (INDEPENDENT_AMBULATORY_CARE_PROVIDER_SITE_OTHER): Payer: 59 | Admitting: Family Medicine

## 2018-10-28 DIAGNOSIS — F419 Anxiety disorder, unspecified: Secondary | ICD-10-CM

## 2018-10-28 MED ORDER — FLUOXETINE HCL 10 MG PO CAPS: 10.0000 mg | ORAL_CAPSULE | Freq: Every day | ORAL | 3 refills | Status: DC

## 2018-10-28 NOTE — Telephone Encounter (Signed)
Virtual made for this morning

## 2018-10-28 NOTE — Progress Notes (Signed)
Virtual Visit via Video Note  I connected with Angela Bryant on 10/28/18 at 10:40 AM EDT by a video enabled telemedicine application and verified that I am speaking with the correct person using two identifiers.  Location: Patient: car Provider: office    I discussed the limitations of evaluation and management by telemedicine and the availability of in person appointments. The patient expressed understanding and agreed to proceed.  History of Present Illness: Pt is in her car.   C/o panic attack this am that work her up.  Her chest felt heavy.  She took a valium and the symptoms subsided.  Pt is seeing a counselor    She has tried a few ssri and had side effects.    Observations/Objective: No vitals obtained due to pt being in her car and not at home Pt is in NAD  Assessment and Plan: 1. Anxiety Pt has list of psych at home---- she will call and make an appointment with psychiatry Start prozac 10 mg daily  - Ambulatory referral to Psychiatry - FLUoxetine (PROZAC) 10 MG capsule; Take 1 capsule (10 mg total) by mouth daily.  Dispense: 30 capsule; Refill: 3   Follow Up Instructions:    I discussed the assessment and treatment plan with the patient. The patient was provided an opportunity to ask questions and all were answered. The patient agreed with the plan and demonstrated an understanding of the instructions.   The patient was advised to call back or seek an in-person evaluation if the symptoms worsen or if the condition fails to improve as anticipated.  I provided 15 minutes of non-face-to-face time during this encounter.   Ann Held, DO

## 2018-10-28 NOTE — Telephone Encounter (Signed)
Returned call to patient and she saw her primary care physician earlier today and was told she was having anxiety at night.  Patient had questions regarding FMLA paper work, and was told that Dr Harriet Masson would not be completing paper work as there were not cardiac events noted on her monitor report.  Dr Harriet Masson spoke with patient by phone to further explain this to the patient.  Patient agreed to plan and verbalized understanding.  No further questions.

## 2018-10-28 NOTE — Telephone Encounter (Signed)
Patient would like a phone call regarding waking up shaking

## 2018-10-29 NOTE — Telephone Encounter (Signed)
I did send something in as I discussed with her -- its on her med list And she may want to discuss a psychiatrist with her counselor

## 2018-11-02 ENCOUNTER — Other Ambulatory Visit: Payer: Self-pay | Admitting: Family Medicine

## 2018-11-02 DIAGNOSIS — G4700 Insomnia, unspecified: Secondary | ICD-10-CM

## 2018-11-02 NOTE — Telephone Encounter (Signed)
Requesting: Ambien Contract: 09/11/2018 UDS: 09/11/19, low risk, 09/11/2019 Last OV: 10/28/2018 Next OV: N/A Last Refill: 10/06/2018, #30--0 RF Database:   Please advise

## 2018-11-05 NOTE — Telephone Encounter (Signed)
Actually the pt told us that the cardiologist told her to be out and we told the pt that she did not put anything in her note to that effect

## 2018-11-23 ENCOUNTER — Ambulatory Visit: Payer: 59 | Admitting: Cardiology

## 2018-11-25 ENCOUNTER — Encounter: Payer: Self-pay | Admitting: Family Medicine

## 2018-12-02 ENCOUNTER — Ambulatory Visit (HOSPITAL_COMMUNITY): Payer: 59 | Admitting: Psychiatry

## 2018-12-03 ENCOUNTER — Other Ambulatory Visit: Payer: Self-pay | Admitting: Family Medicine

## 2018-12-03 DIAGNOSIS — G4700 Insomnia, unspecified: Secondary | ICD-10-CM

## 2018-12-04 NOTE — Telephone Encounter (Signed)
Last zolpidem RX:  11/02/18, #30 Last OV: 10/28/18 Next OV:  None scheduled UDS:  09/11/18 CSC:  09/11/18

## 2018-12-08 ENCOUNTER — Encounter: Payer: Self-pay | Admitting: Family Medicine

## 2018-12-08 NOTE — Telephone Encounter (Signed)
She has seen cardiology and her heart is fine I really thinks she is having anxiety attacks  Is she still seeing a counselor ?

## 2018-12-14 NOTE — Telephone Encounter (Signed)
I really think its anxiety / panic attacks.   They can come on like that for no reason.  She could f/u with [facility] but they already told her her heart was ok

## 2018-12-31 ENCOUNTER — Other Ambulatory Visit: Payer: Self-pay | Admitting: Family Medicine

## 2018-12-31 DIAGNOSIS — G4700 Insomnia, unspecified: Secondary | ICD-10-CM

## 2018-12-31 NOTE — Telephone Encounter (Signed)
Last OV 10/28/18 Last refill 12/04/18 #30/0 Next OV not scheduled

## 2019-01-06 ENCOUNTER — Other Ambulatory Visit: Payer: 59

## 2019-01-20 ENCOUNTER — Encounter: Payer: Self-pay | Admitting: Family Medicine

## 2019-01-21 NOTE — Telephone Encounter (Signed)
If she is still having chest pain / palp-- she should go to er Otherwise -- sshe can make ov --- pref not virtual  She should get the vaccine when offered and there should be my chart messages coming with info--- my guess is she will be in group 2

## 2019-01-27 ENCOUNTER — Encounter: Payer: Self-pay | Admitting: Family Medicine

## 2019-01-27 DIAGNOSIS — F419 Anxiety disorder, unspecified: Secondary | ICD-10-CM

## 2019-01-27 DIAGNOSIS — G4700 Insomnia, unspecified: Secondary | ICD-10-CM

## 2019-01-27 DIAGNOSIS — K219 Gastro-esophageal reflux disease without esophagitis: Secondary | ICD-10-CM

## 2019-01-27 DIAGNOSIS — J302 Other seasonal allergic rhinitis: Secondary | ICD-10-CM

## 2019-01-29 ENCOUNTER — Telehealth: Payer: Self-pay | Admitting: Family Medicine

## 2019-01-29 MED ORDER — OMEPRAZOLE 40 MG PO CPDR: 40.0000 mg | DELAYED_RELEASE_CAPSULE | Freq: Every day | ORAL | 0 refills | Status: DC

## 2019-01-29 MED ORDER — CETIRIZINE HCL 10 MG PO TABS: 10.0000 mg | ORAL_TABLET | Freq: Every day | ORAL | 0 refills | Status: AC

## 2019-01-29 MED ORDER — DIAZEPAM 5 MG PO TABS: 5.0000 mg | ORAL_TABLET | ORAL | 0 refills | Status: DC | PRN

## 2019-01-29 MED ORDER — FLUOXETINE HCL 10 MG PO CAPS: 10.0000 mg | ORAL_CAPSULE | Freq: Every day | ORAL | 0 refills | Status: DC

## 2019-01-29 NOTE — Telephone Encounter (Signed)
Need clarification on frequency of diazepam 5 mg tab.

## 2019-01-29 NOTE — Telephone Encounter (Signed)
Medication Refill - Medication: diazepam (VALIUM) 5 MG tablet  Has the patient contacted their pharmacy? Yes.  No frequency and needs clarification.   (Agent: If no, request that the patient contact the pharmacy for the refill.) (Agent: If yes, when and what did the pharmacy advise?)  Preferred Pharmacy (with phone number or street name): [facility] [address] [place], [place] - [phone] [place].  Agent: Please be advised that RX refills may take up to 3 business days. We ask that you follow-up with your pharmacy.

## 2019-01-30 ENCOUNTER — Other Ambulatory Visit: Payer: Self-pay | Admitting: Family Medicine

## 2019-01-30 DIAGNOSIS — G4700 Insomnia, unspecified: Secondary | ICD-10-CM

## 2019-02-01 ENCOUNTER — Encounter: Payer: Self-pay | Admitting: Family Medicine

## 2019-02-01 ENCOUNTER — Other Ambulatory Visit: Payer: Self-pay | Admitting: Family Medicine

## 2019-02-01 DIAGNOSIS — F419 Anxiety disorder, unspecified: Secondary | ICD-10-CM

## 2019-02-01 MED ORDER — DIAZEPAM 5 MG PO TABS: 5.0000 mg | ORAL_TABLET | Freq: Two times a day (BID) | ORAL | 0 refills | Status: DC | PRN

## 2019-02-01 NOTE — Telephone Encounter (Signed)
Valium resent

## 2019-02-01 NOTE — Telephone Encounter (Signed)
See below. In the chart it states Take 1 tablet (5 mg total) by mouth as needed.

## 2019-04-30 ENCOUNTER — Other Ambulatory Visit: Payer: Self-pay | Admitting: Family Medicine

## 2019-04-30 DIAGNOSIS — G4700 Insomnia, unspecified: Secondary | ICD-10-CM

## 2019-04-30 NOTE — Telephone Encounter (Signed)
Requesting: ambien  Contract: 9.4.20 UDS:8.28.20 Last Visit:10.14.20 Next Visit: N/A Last Refill:1.19.21  Please Advise

## 2019-05-17 ENCOUNTER — Telehealth: Payer: Self-pay | Admitting: Physician Assistant

## 2019-05-17 NOTE — Telephone Encounter (Signed)
Patient's last office was 10/29/17, is this okay to refill?

## 2019-05-18 MED ORDER — LINACLOTIDE 72 MCG PO CAPS: 72.0000 ug | ORAL_CAPSULE | Freq: Every day | ORAL | 0 refills | Status: DC

## 2019-05-18 MED ORDER — LINACLOTIDE 72 MCG PO CAPS: 72.0000 ug | ORAL_CAPSULE | Freq: Every day | ORAL | 2 refills | Status: DC

## 2019-05-18 NOTE — Telephone Encounter (Signed)
Pt called requesting rf for 90-day supply. She states that she only has insurance coverage until the end of this month so it will be hard to pay out-of-pockets for any refills.

## 2019-05-18 NOTE — Telephone Encounter (Signed)
Ok to refill x 3 - then needs office visit

## 2019-05-19 NOTE — Telephone Encounter (Signed)
One 90 day refill has been sent to the pharmacy. Patient will need a follow up for further refills.

## 2019-07-02 ENCOUNTER — Telehealth: Payer: Self-pay | Admitting: Family Medicine

## 2019-07-02 MED ORDER — PREDNISONE 10 MG PO TABS: ORAL_TABLET | ORAL | 0 refills | Status: DC

## 2019-07-02 NOTE — Telephone Encounter (Signed)
Please advise 

## 2019-07-02 NOTE — Telephone Encounter (Signed)
Patient states she is on vacation and she has broken out In hives all over her body. She states she has been seen before for the hives and wants to know if something can be called in for her to get some relief before while she's away

## 2019-07-02 NOTE — Telephone Encounter (Signed)
Per Lowne okay to send in Pred pack. Med sent and pt notified

## 2019-07-02 NOTE — Telephone Encounter (Signed)
She can get benadryl otc or another otc antihistamine ie zyrtec , claritin  For prednisone she would need to be seen --- maybe virtual visit this afternoon or tomorrow

## 2019-07-13 ENCOUNTER — Other Ambulatory Visit: Payer: Self-pay | Admitting: Family Medicine

## 2019-07-13 DIAGNOSIS — K219 Gastro-esophageal reflux disease without esophagitis: Secondary | ICD-10-CM

## 2019-07-26 ENCOUNTER — Other Ambulatory Visit: Payer: Self-pay | Admitting: Family Medicine

## 2019-07-26 DIAGNOSIS — G4700 Insomnia, unspecified: Secondary | ICD-10-CM

## 2019-07-26 MED ORDER — ZOLPIDEM TARTRATE 10 MG PO TABS: 10.0000 mg | ORAL_TABLET | Freq: Every day | ORAL | 0 refills | Status: DC

## 2019-07-26 NOTE — Telephone Encounter (Signed)
Patient is requesting enough medication until Wednesday appointment.  Medication: zolpidem (AMBIEN) 10 MG tablet    Has the patient contacted their pharmacy? No. (If no, request that the patient contact the pharmacy for the refill.) (If yes, when and what did the pharmacy advise?)  Preferred Pharmacy (with phone number or street name): [facility] [address] [place], [place] - [address] [place].  [address] [place] [address]  Phone:  [phone] Fax:  [phone]  DEA #:  --  Agent: Please be advised that RX refills may take up to 3 business days. We ask that you follow-up with your pharmacy.

## 2019-07-26 NOTE — Telephone Encounter (Signed)
Requesting: Ambien Contract: 09/11/2018 UDS: 09/11/2018 Last OV: 10/28/2018 Next OV: 07/28/2019 Last Refill: 04/30/2019, #90--0 RF Database:   Please advise

## 2019-07-28 ENCOUNTER — Other Ambulatory Visit: Payer: Self-pay

## 2019-07-28 ENCOUNTER — Encounter: Payer: Self-pay | Admitting: Family Medicine

## 2019-07-28 VITALS — HR 76 | Ht 66.0 in

## 2019-07-28 NOTE — Progress Notes (Deleted)
Virtual Visit via Video Note  I connected with Gavin Pound on 07/28/19 at  9:20 AM EDT by a video enabled telemedicine application and verified that I am speaking with the correct person using two identifiers.  Location: Patient: *** Provider: ***   I discussed the limitations of evaluation and management by telemedicine and the availability of in person appointments. The patient expressed understanding and agreed to proceed.  History of Present Illness:    Observations/Objective:   Assessment and Plan:   Follow Up Instructions:    I discussed the assessment and treatment plan with the patient. The patient was provided an opportunity to ask questions and all were answered. The patient agreed with the plan and demonstrated an understanding of the instructions.   The patient was advised to call back or seek an in-person evaluation if the symptoms worsen or if the condition fails to improve as anticipated.  I provided *** minutes of non-face-to-face time during this encounter.   Donato Schultz, DO  This encounter was created in error - please disregard.

## 2019-08-01 NOTE — Progress Notes (Signed)
Cancel app

## 2019-08-03 IMAGING — US US SOFT TISSUE HEAD/NECK
1 series · 14 of 23 positions shown · non-contrast
Comparison: None.

CLINICAL DATA: Neck fullness , bilateral base of neck swelling x1
year.

EXAM:
ULTRASOUND OF HEAD/NECK SOFT TISSUES
TECHNIQUE: Ultrasound examination of the head and neck soft tissues was
performed in the area of clinical concern.

[Series 1: us soft tissue head/neck · 0.08mm/px · 14 of 23 slices shown]
[im 1/23]
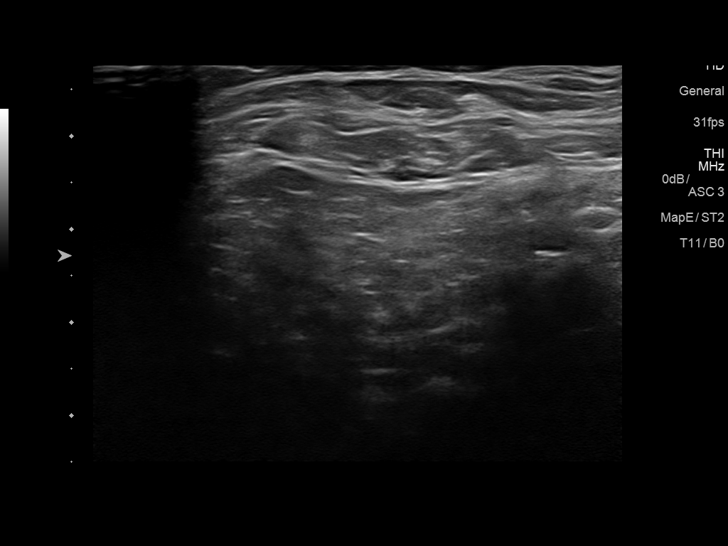
[im 3/23]
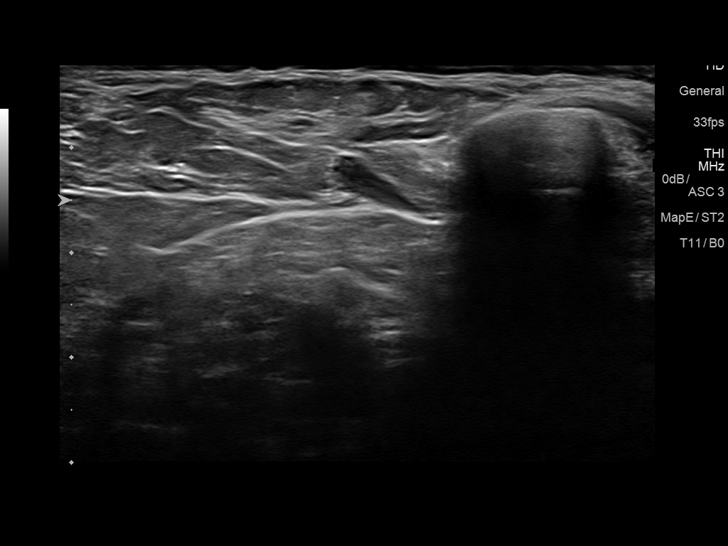
[im 5/23]
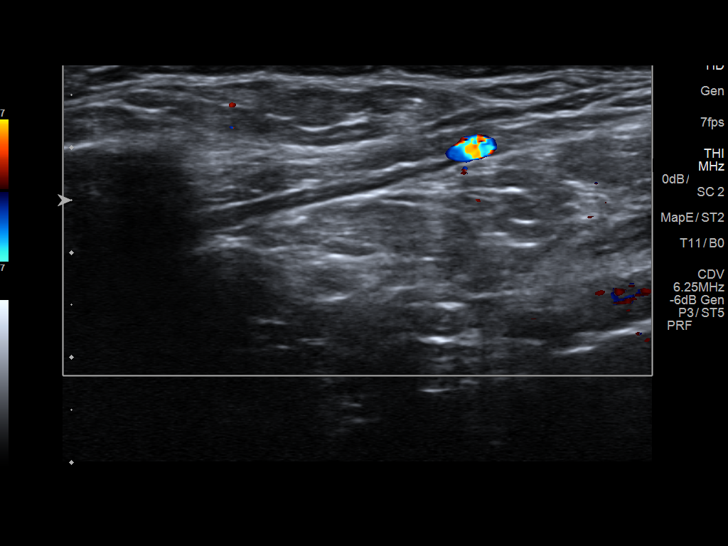
[im 6/23]
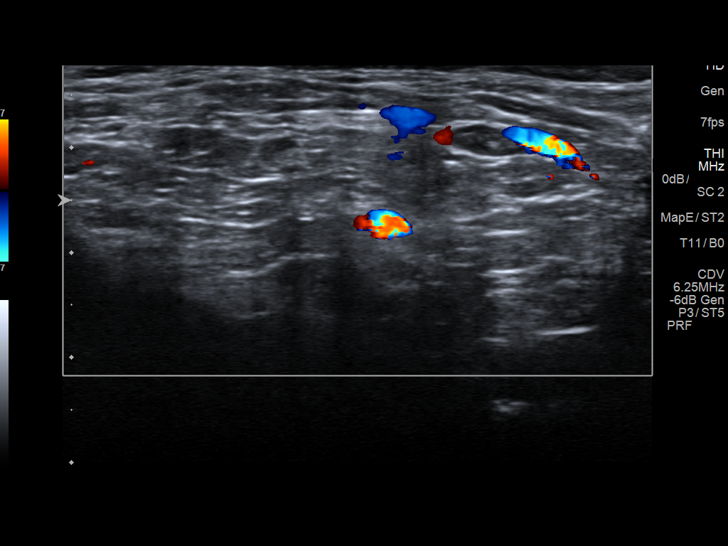
[im 8/23]
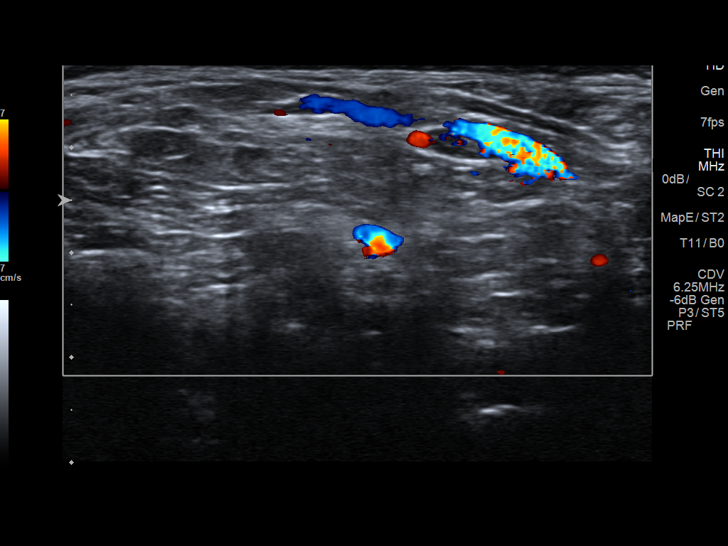
[im 10/23]
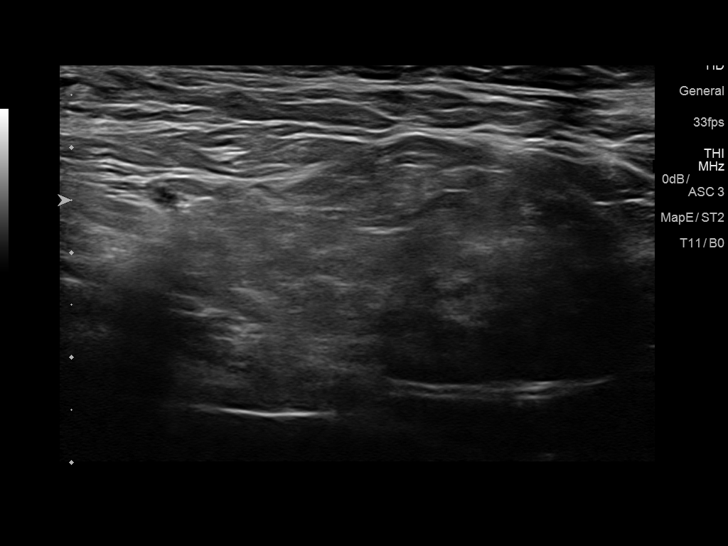
[im 11/23]
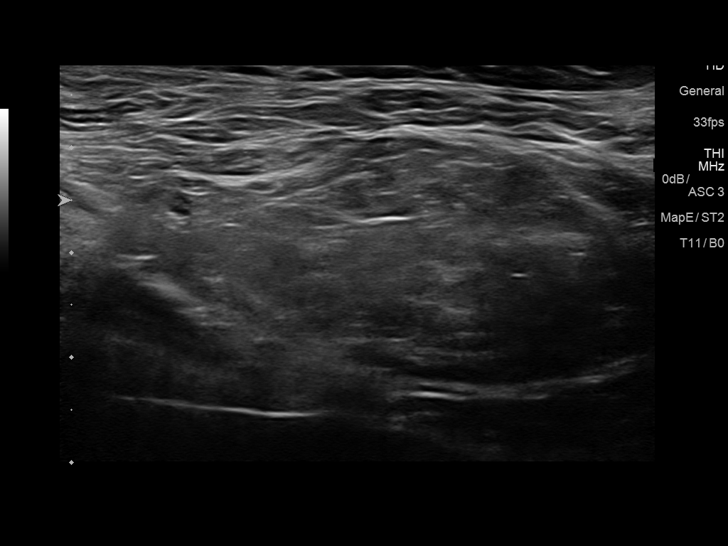
[im 13/23]
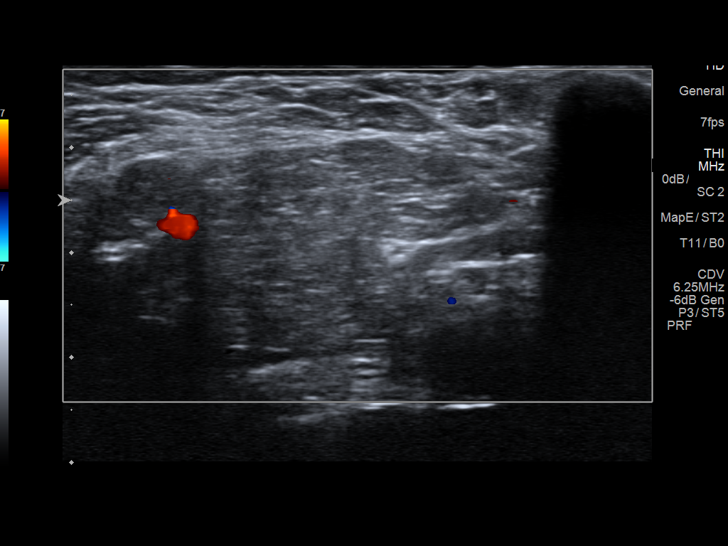
[im 14/23]
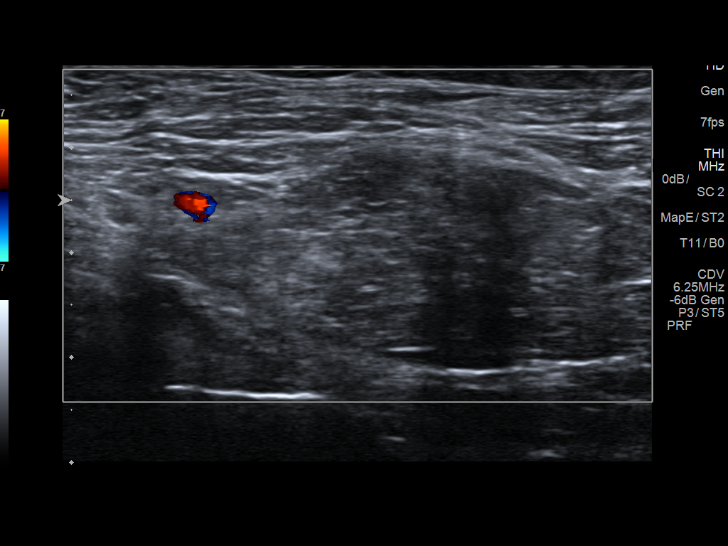
[im 16/23]
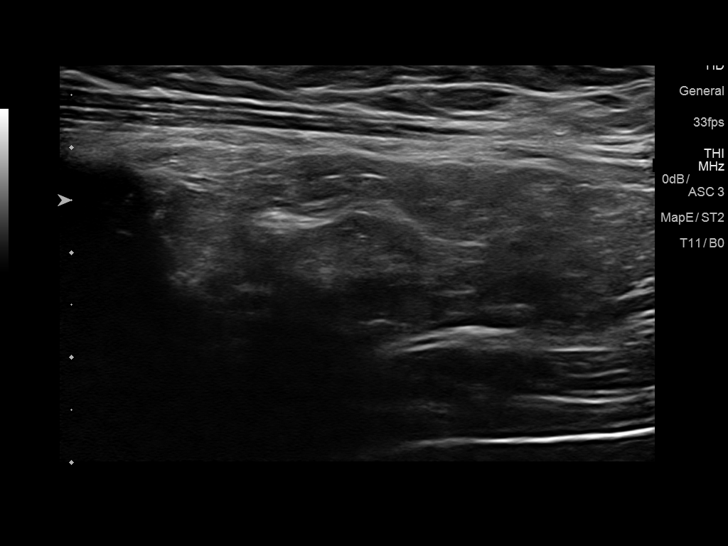
[im 18/23]
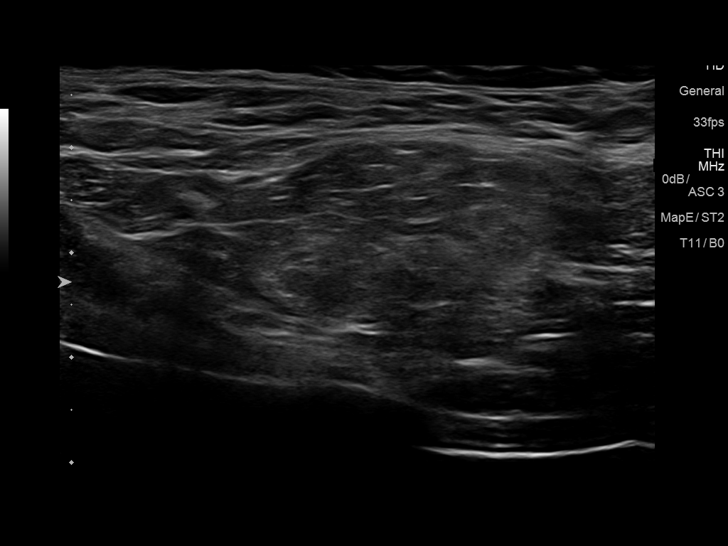
[im 19/23]
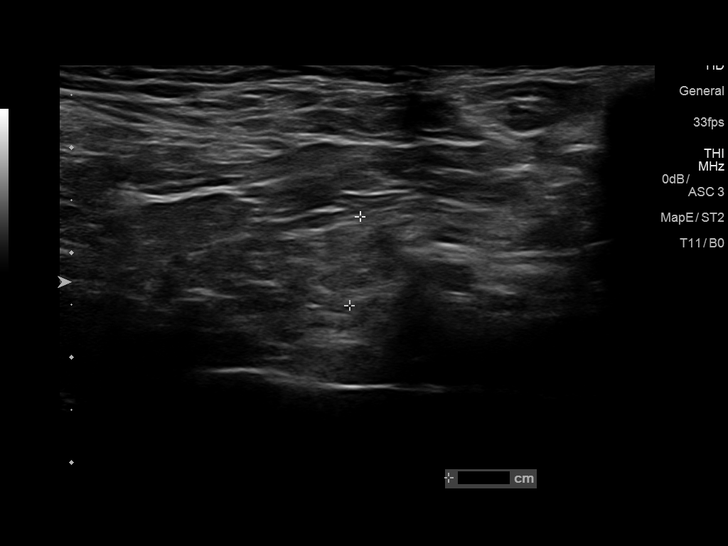
[im 21/23]
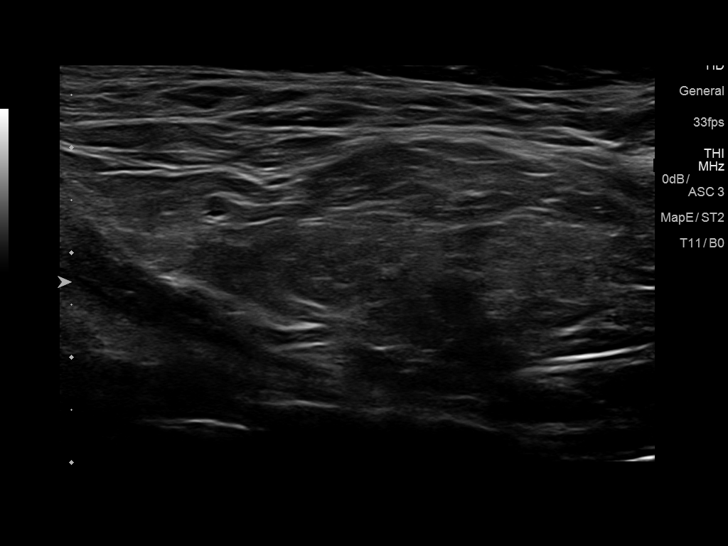
[im 23/23]
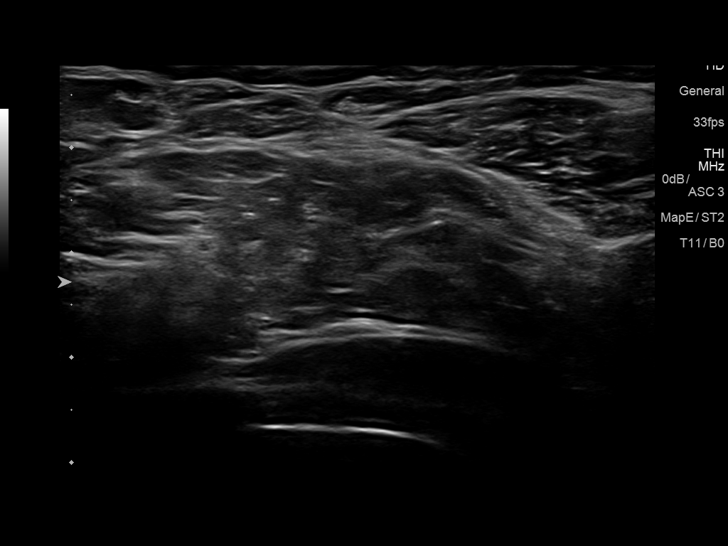

[14 of 23 positions shown; findings below may reference images not displayed]

FINDINGS: No mass, cyst, adenopathy, abscess, aneurysm, or other pathologic
findings.
IMPRESSION: No pathologic findings on ultrasound.

## 2019-08-26 ENCOUNTER — Encounter: Payer: Self-pay | Admitting: Family Medicine

## 2019-08-27 NOTE — Telephone Encounter (Signed)
Without being able to see it its hard to tell She can try cortisone cream otc and /  or lotrimen ultra

## 2019-08-27 NOTE — Telephone Encounter (Signed)
Please advise. Pt states new insurance has not started yet

## 2019-08-30 NOTE — Telephone Encounter (Signed)
She can take benadryl Use lotrimen ultra cream--- it used to be rx  For prescription or pred we would have to see her

## 2019-09-08 NOTE — Telephone Encounter (Signed)
Pt said because of her insurance she can not do an in office appointment and Dr. Laury Axon knows this. She has tried over-the-counter products and nothing is working. She said she will send more pictures because it is worse. Please advise.

## 2019-09-08 NOTE — Telephone Encounter (Signed)
See below. Pictures attached

## 2019-09-09 ENCOUNTER — Other Ambulatory Visit: Payer: Self-pay | Admitting: Family Medicine

## 2019-09-09 DIAGNOSIS — R21 Rash and other nonspecific skin eruption: Secondary | ICD-10-CM

## 2019-09-09 MED ORDER — PREDNISONE 10 MG PO TABS: ORAL_TABLET | ORAL | 0 refills | Status: DC

## 2019-09-09 NOTE — Telephone Encounter (Signed)
Ok to call in pred dose pak but she will HAVE to be see somewhere if that does not work

## 2019-09-10 MED ORDER — PREDNISONE 10 MG PO TABS: ORAL_TABLET | ORAL | 0 refills | Status: DC

## 2019-09-13 NOTE — Telephone Encounter (Signed)
Please make app

## 2019-09-14 ENCOUNTER — Telehealth: Payer: Self-pay | Admitting: Family Medicine

## 2019-09-14 NOTE — Telephone Encounter (Signed)
Please disregard- last mychart message informed she needed to be seen.

## 2019-09-14 NOTE — Telephone Encounter (Signed)
Patient states she is unable to take prednisone. She states it makes her feel jittery and have her wired up for hours. She want to know if it's something else that can be sent in for her. Please advise

## 2019-09-14 NOTE — Telephone Encounter (Signed)
Please advise 

## 2019-09-15 ENCOUNTER — Telehealth (INDEPENDENT_AMBULATORY_CARE_PROVIDER_SITE_OTHER): Payer: 59 | Admitting: Family Medicine

## 2019-09-15 ENCOUNTER — Encounter: Payer: Self-pay | Admitting: Family Medicine

## 2019-09-15 ENCOUNTER — Other Ambulatory Visit: Payer: Self-pay

## 2019-09-15 VITALS — HR 76 | Wt 154.0 lb

## 2019-09-15 DIAGNOSIS — L309 Dermatitis, unspecified: Secondary | ICD-10-CM

## 2019-09-15 MED ORDER — TRIAMCINOLONE ACETONIDE 0.1 % EX CREA: 1.0000 "application " | TOPICAL_CREAM | Freq: Two times a day (BID) | CUTANEOUS | 2 refills | Status: DC

## 2019-09-15 NOTE — Progress Notes (Signed)
Virtual Visit via Video Note  I connected with Angela Bryant on 09/15/19 at  9:00 AM EDT by a video enabled telemedicine application and verified that I am speaking with the correct person using two identifiers.  Location/ persons involved  Patient: home alone Provider: home    I discussed the limitations of evaluation and management by telemedicine and the availability of in person appointments. The patient expressed understanding and agreed to proceed.  History of Present Illness: Pt is home c/o rash for several weeks.   Worse at night, itchy.   She has used otc creams with no relief.   pred was sent in for her but she does not like taking pred because of how it makes her feel.  She also will not have ins until next month.  She has a hx of eczema   Observations/Objective: Pictures of rash in my chart Vitals:   09/15/19 0856  Pulse: 76    Rash is ok right now--- worse at night ---   Assessment and Plan: 1. Eczema, unspecified type Mix triamcinolone with eucerin --- use for 10-14 days  If no improvement she will see her derm She did not want to take pred taper See my chart for pictures  - triamcinolone cream (KENALOG) 0.1 %; Apply 1 application topically 2 (two) times daily.  Dispense: 30 g; Refill: 2   Follow Up Instructions:    I discussed the assessment and treatment plan with the patient. The patient was provided an opportunity to ask questions and all were answered. The patient agreed with the plan and demonstrated an understanding of the instructions.   The patient was advised to call back or seek an in-person evaluation if the symptoms worsen or if the condition fails to improve as anticipated.  I provided 25 minutes of non-face-to-face time during this encounter.   Donato Schultz, DO

## 2019-09-22 MED ORDER — MONTELUKAST SODIUM 10 MG PO TABS: 10.0000 mg | ORAL_TABLET | Freq: Every day | ORAL | 0 refills | Status: DC

## 2019-09-22 MED ORDER — EPINEPHRINE 0.3 MG/0.3ML IJ SOAJ: 0.3000 mg | INTRAMUSCULAR | 1 refills | Status: DC | PRN

## 2019-09-22 NOTE — Addendum Note (Signed)
Addended byConrad [place] D on: 09/22/2019 01:38 PM   Modules accepted: Orders

## 2019-09-22 NOTE — Addendum Note (Signed)
Addended byConrad [place] D on: 09/22/2019 01:12 PM   Modules accepted: Orders

## 2019-09-22 NOTE — Addendum Note (Signed)
Addended byConrad [place] D on: 09/22/2019 01:39 PM   Modules accepted: Orders

## 2019-09-23 NOTE — Telephone Encounter (Signed)
She would need to be seen in person for anything else--- I understand she has no ins so its difficult but I agree the allergy dr is the best but to get an injection anywhere she would need an ov

## 2019-10-05 ENCOUNTER — Encounter: Payer: Self-pay | Admitting: Family Medicine

## 2019-10-05 DIAGNOSIS — G4700 Insomnia, unspecified: Secondary | ICD-10-CM

## 2019-10-05 DIAGNOSIS — F419 Anxiety disorder, unspecified: Secondary | ICD-10-CM

## 2019-10-06 ENCOUNTER — Other Ambulatory Visit: Payer: Self-pay | Admitting: Family Medicine

## 2019-10-06 DIAGNOSIS — F419 Anxiety disorder, unspecified: Secondary | ICD-10-CM

## 2019-10-06 DIAGNOSIS — G4700 Insomnia, unspecified: Secondary | ICD-10-CM

## 2019-10-06 MED ORDER — ZOLPIDEM TARTRATE 10 MG PO TABS: 10.0000 mg | ORAL_TABLET | Freq: Every day | ORAL | 0 refills | Status: DC

## 2019-10-06 MED ORDER — DIAZEPAM 5 MG PO TABS: 5.0000 mg | ORAL_TABLET | Freq: Two times a day (BID) | ORAL | 0 refills | Status: DC | PRN

## 2019-10-06 NOTE — Telephone Encounter (Signed)
Requesting refill on Ambien and diazepam.   Last OV: 09/15/19 Last Fill on Ambien: 07/26/2019 #90 and 0RF Pt sig: 1 tab qhs prn Last Fill on diazepam: 02/01/2019 #90 and 0RF Pt sig: 1 tab q12h prn UDS: 09/11/2018 Low risk

## 2019-11-02 ENCOUNTER — Other Ambulatory Visit: Payer: Self-pay

## 2019-11-02 ENCOUNTER — Encounter: Payer: Self-pay | Admitting: Family Medicine

## 2019-11-02 ENCOUNTER — Telehealth (INDEPENDENT_AMBULATORY_CARE_PROVIDER_SITE_OTHER): Payer: 59 | Admitting: Family Medicine

## 2019-11-02 VITALS — HR 73 | Ht 66.0 in | Wt 150.0 lb

## 2019-11-02 DIAGNOSIS — K529 Noninfective gastroenteritis and colitis, unspecified: Secondary | ICD-10-CM

## 2019-11-02 NOTE — Progress Notes (Signed)
Virtual Visit via Video Note  I connected with Gavin Pound on 11/02/19 at  9:20 AM EDT by a video enabled telemedicine application and verified that I am speaking with the correct person using two identifiers.  Location/ persons in visit  Patient: home alone Provider: office    I discussed the limitations of evaluation and management by telemedicine and the availability of in person appointments. The patient expressed understanding and agreed to proceed.  History of Present Illness: Pt is home c/o NV and fatigue with headache with scratchy throat x since weekend   She took a covid test yesterday but is waiting for result She has not had any more vomiting or diarrhea since Sunday.   Yesterday she was tired and had headache   She is supposed to go back to work tomorrow.    Today she is tired and has a headache  ------but no more nausea  She would like to go back to work tomorrow as long as her covid test is neg.     Observations/Objective: Vitals:   11/02/19 0911  Pulse: 73   Pt is in nad   Assessment and Plan: 1. Gastroenteritis Improving  covid test pending  If neg can return to work   Follow Up Instructions:    I discussed the assessment and treatment plan with the patient. The patient was provided an opportunity to ask questions and all were answered. The patient agreed with the plan and demonstrated an understanding of the instructions.   The patient was advised to call back or seek an in-person evaluation if the symptoms worsen or if the condition fails to improve as anticipated.  I provided 25 minutes of non-face-to-face time during this encounter.   Donato Schultz, DO

## 2019-11-03 IMAGING — US US THYROID
1 series · 13 of 25 positions shown · non-contrast
Comparison: None.

CLINICAL DATA: Right thyroid nodule.

EXAM:
THYROID ULTRASOUND
TECHNIQUE: Ultrasound examination of the thyroid gland and adjacent soft
tissues was performed.

[Series 1: us thyroid · 0.07mm/px · 13 of 82 slices shown]
[im 1/82]
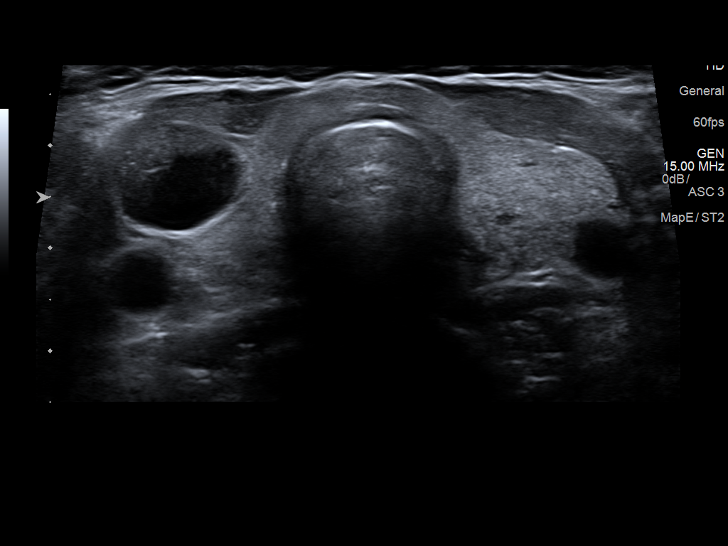
[im 7/82]
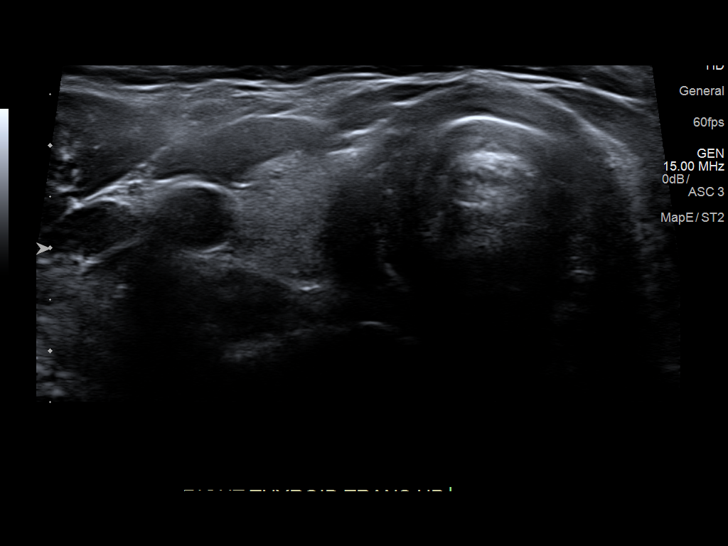
[im 14/82]
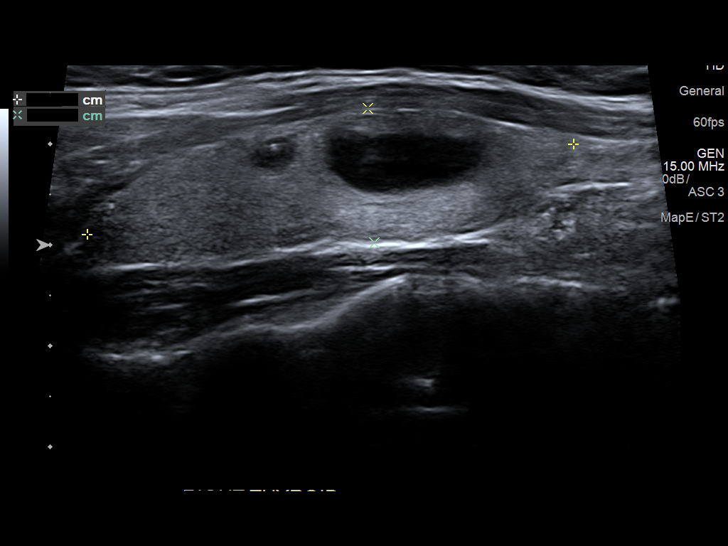
[im 21/82]
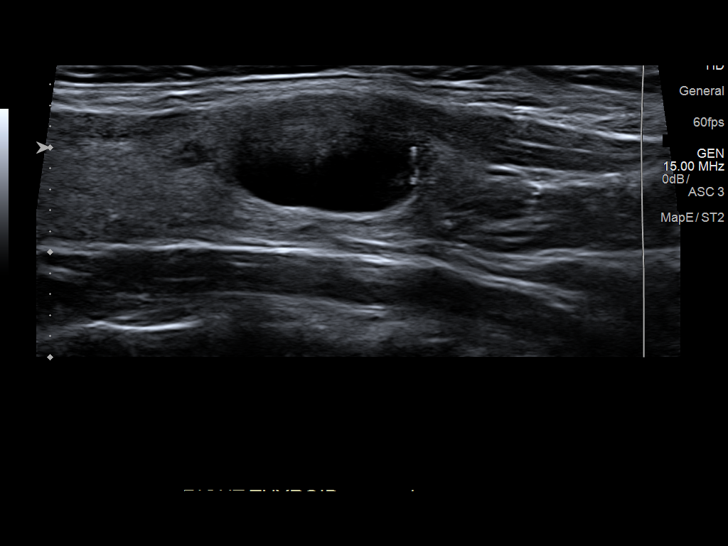
[im 28/82]
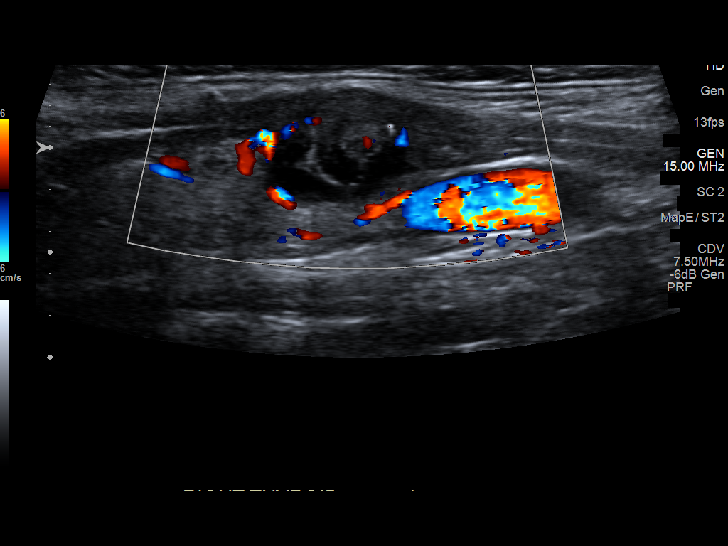
[im 34/82]
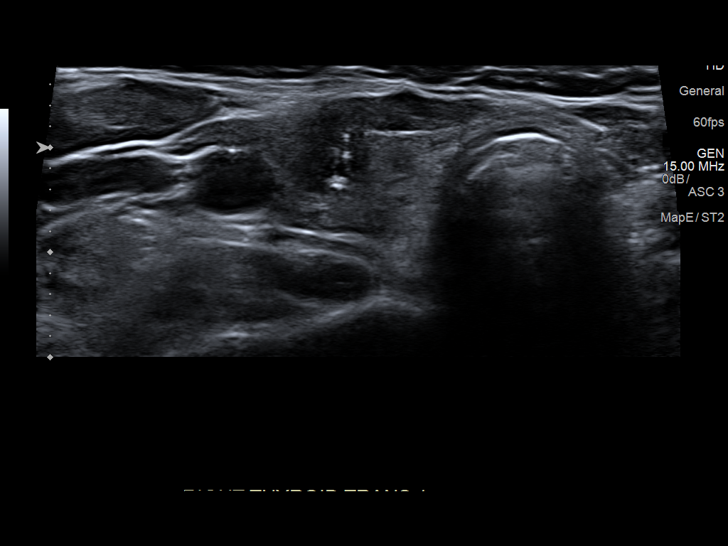
[im 41/82]
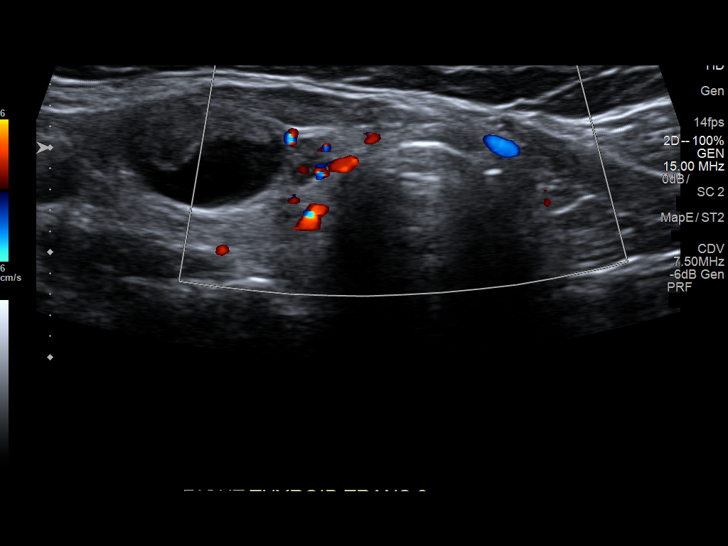
[im 48/82]
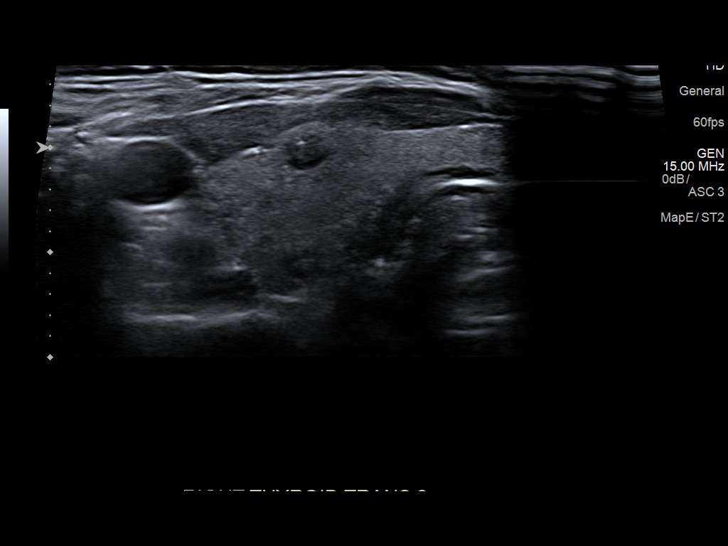
[im 55/82]
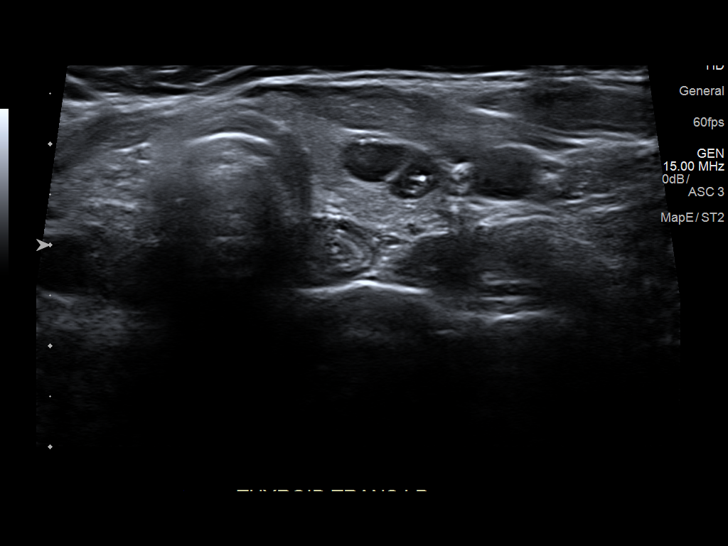
[im 61/82]
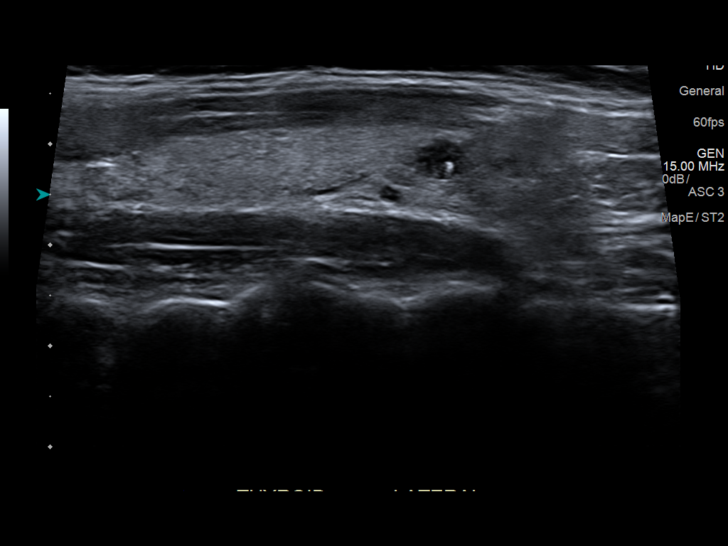
[im 68/82]
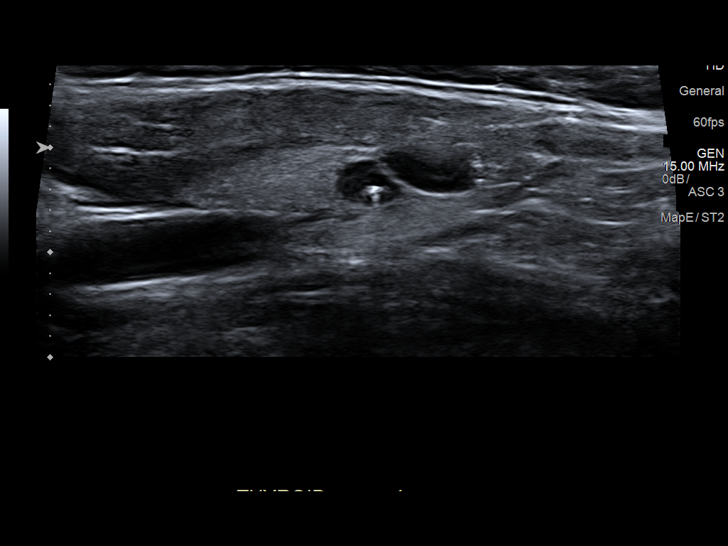
[im 75/82]
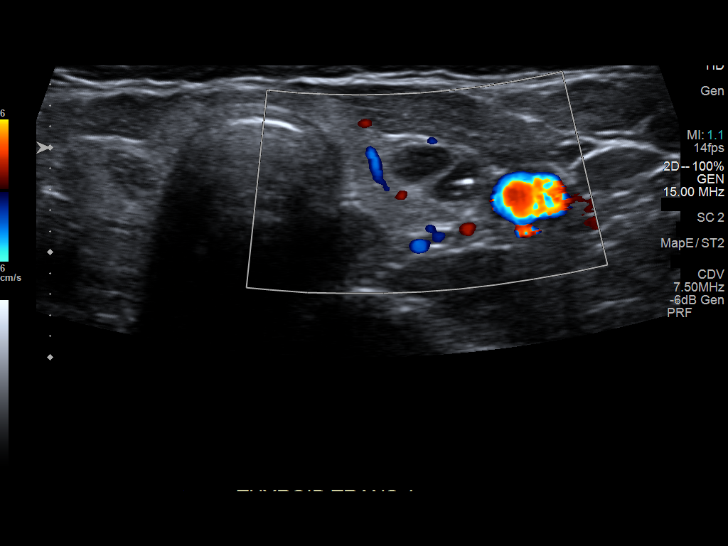
[im 82/82]
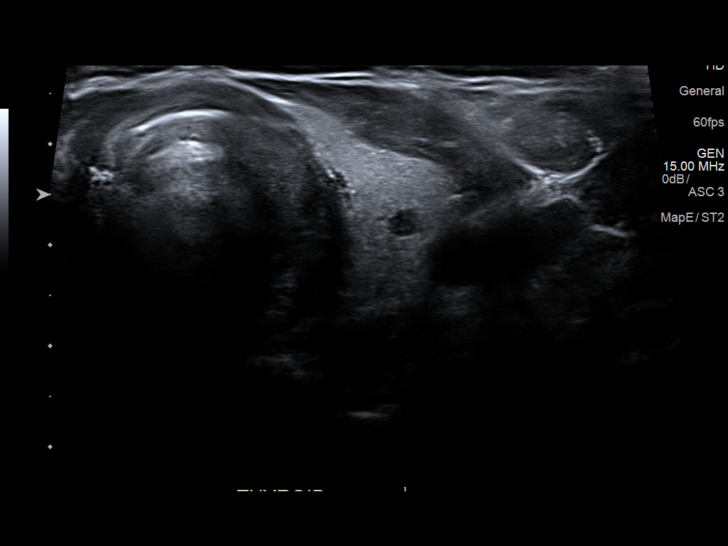

[13 of 25 positions shown; findings below may reference images not displayed]

FINDINGS: Parenchymal Echotexture: Mildly heterogenous

Isthmus: 0.3 cm

Right lobe: 4.9 x 1.3 x 1.9 cm

Left lobe: 5.4 x 1.4 x 1.8 cm

_________________________________________________________

Estimated total number of nodules >/= 1 cm: 2

Number of spongiform nodules >/=  2 cm not described below (TR1): 0

Number of mixed cystic and solid nodules >/= 1.5 cm not described
below (TR2): 0

_________________________________________________________

Nodule # 1:

Location: Right; Mid

Maximum size: 2.0 cm; Other 2 dimensions: 1.0 x 1.4 cm

Composition: mixed cystic and solid (1)

Echogenicity: cannot determine (1)

Shape: not taller-than-wide (0)

Margins: smooth (0)

Echogenic foci: large comet-tail artifacts (0)

ACR TI-RADS total points: 2.

ACR TI-RADS risk category: TR2 (2 points).

ACR TI-RADS recommendations:

This nodule does NOT meet TI-RADS criteria for biopsy or dedicated
follow-up.

_________________________________________________________

There are 2 additional hypoechoic nodules in the right thyroid lobe
and the largest measures 0.6 cm. The small right thyroid nodules do
not meet criteria for biopsy or dedicated follow-up.

Nodule # 2:

Location: Left; Inferior

Maximum size: 1.6 cm; Other 2 dimensions: 0.6 x 1.0 cm

Composition: mixed cystic and solid (1)

Echogenicity: cannot determine (1)

Shape: not taller-than-wide (0)

Margins: ill-defined (0)

Echogenic foci: large comet-tail artifacts (0)

ACR TI-RADS total points: 2.

ACR TI-RADS risk category: TR2 (2 points).

ACR TI-RADS recommendations:

This nodule does NOT meet TI-RADS criteria for biopsy or dedicated
follow-up.

_________________________________________________________
IMPRESSION: Bilateral thyroid nodules.

Dominant thyroid nodules are mixed cystic and solid composition and
do not meet criteria for biopsy.

The above is in keeping with the ACR TI-RADS recommendations - [HOSPITAL] 4184;[DATE].

## 2019-11-09 ENCOUNTER — Telehealth: Payer: Self-pay | Admitting: Family Medicine

## 2019-11-09 ENCOUNTER — Other Ambulatory Visit: Payer: Self-pay

## 2019-11-09 DIAGNOSIS — R1013 Epigastric pain: Secondary | ICD-10-CM

## 2019-11-09 NOTE — Telephone Encounter (Signed)
Caller Angela Bryant  Call Back : [phone]  Patient states she ate on Sunday 11/07/2019, after she ate she felt a discomfort in her chest and still feeling that way. She is requesting a referral to someone that can look down her throat/chest. Patient sent to triage nurse and offered an appointment, patient declined appointment with pcp at this time b/c of work.    Please Advise

## 2019-11-09 NOTE — Telephone Encounter (Signed)
Please advise 

## 2019-11-09 NOTE — Telephone Encounter (Signed)
Refer to gi for dyspepsia-- tell her to take omeprazole otc

## 2019-11-10 ENCOUNTER — Encounter: Payer: Self-pay | Admitting: Family Medicine

## 2019-11-10 ENCOUNTER — Telehealth: Payer: Self-pay

## 2019-11-10 NOTE — Telephone Encounter (Signed)
Spoke with patient. Referral placed. Pt states already taking Omeprazole. Pt states sensation has gotten better. Pt states it feels like there is a rock in her throat and is worse when she lays down.

## 2019-11-10 NOTE — Telephone Encounter (Signed)
Nurse Assessment Nurse: Isabell Jarvis, RN, [place] Date/Time Lamount Cohen Time): 11/09/2019 9:42:47 AM Confirm and document reason for call. If symptomatic, describe symptoms. ---Caller states 3 days ago she began having a feeling like something was stuck in her throat and she now feels like there is something lodged in her chest and it is painful, its worse when she is sitting or laying down. Pain is 5 out of 10 pain scale. No difficulty breathing. No fever. Does the patient have any new or worsening symptoms? ---Yes Will a triage be completed? ---Yes Related visit to physician within the last 2 weeks? ---No Does the PT have any chronic conditions? (i.e. diabetes, asthma, this includes High risk factors for pregnancy, etc.) ---Yes List chronic conditions. ---Asthma Is the patient pregnant or possibly pregnant? (Ask all females between the ages of 59-55) ---No Is this a behavioral health or substance abuse call? ---No Guidelines Guideline Title Affirmed Question Affirmed Notes Nurse Date/Time (Eastern Time) Swallowed Foreign Body [1] More than 3 days since swallowed AND [2] FB hasn't passed in the stools AND [3] FB is of Isabell Jarvis, Lynne Leader 11/09/2019 9:45:30 AM PLEASE NOTE: All timestamps contained within this report are represented as [place] Standard Time. CONFIDENTIALTY NOTICE: This fax transmission is intended only for the addressee. It contains information that is legally privileged, confidential or otherwise protected from use or disclosure. If you are not the intended recipient, you are strictly prohibited from reviewing, disclosing, copying using or disseminating any of this information or taking any action in reliance on or regarding this information. If you have received this fax in error, please notify us immediately by telephone so that we can arrange for its return to [place]. Phone: [phone], Toll-Free: [phone], Fax: [phone] Page: 2 of 2 Call Id:  [id number] Guidelines Guideline Title Affirmed Question Affirmed Notes Nurse Date/Time Lamount Cohen Time) concern (e.g., sharp, large, valuable, etc.) Disp. Time Lamount Cohen Time) Disposition Final User 11/09/2019 9:38:03 AM Send to Urgent Jasmine December 11/09/2019 9:50:56 AM See PCP within 24 Hours Yes Isabell Jarvis, RN, Elveria Rising Disagree/Comply Comply Caller Understands Yes PreDisposition Call Doctor Care Advice Given Per Guideline SEE PCP WITHIN 24 HOURS: CALL BACK IF: * You become worse CARE ADVICE given per Swallowed Foreign Body (Adult) guideline. Comments User: Lonn Georgia, RN Date/Time Lamount Cohen Time): 11/09/2019 9:52:16 AM Caller connected to office to make an appt. Referrals REFERRED TO PCP OFFICE

## 2019-11-11 NOTE — Telephone Encounter (Signed)
She can take it bid or add pepcid at night

## 2019-11-11 NOTE — Progress Notes (Signed)
Can you change to [place] ?

## 2019-11-12 NOTE — Telephone Encounter (Signed)
Pt called. Left VM to call back

## 2019-11-18 ENCOUNTER — Other Ambulatory Visit: Payer: Self-pay

## 2019-11-18 DIAGNOSIS — J302 Other seasonal allergic rhinitis: Secondary | ICD-10-CM

## 2019-11-18 MED ORDER — MONTELUKAST SODIUM 10 MG PO TABS: 10.0000 mg | ORAL_TABLET | Freq: Every day | ORAL | 1 refills | Status: DC

## 2019-11-23 ENCOUNTER — Encounter: Payer: Self-pay | Admitting: Nurse Practitioner

## 2019-11-23 ENCOUNTER — Other Ambulatory Visit (INDEPENDENT_AMBULATORY_CARE_PROVIDER_SITE_OTHER): Payer: 59

## 2019-11-23 ENCOUNTER — Ambulatory Visit: Payer: 59 | Admitting: Nurse Practitioner

## 2019-11-23 VITALS — BP 124/80 | HR 84 | Ht 65.75 in | Wt 144.5 lb

## 2019-11-23 DIAGNOSIS — K625 Hemorrhage of anus and rectum: Secondary | ICD-10-CM

## 2019-11-23 DIAGNOSIS — R197 Diarrhea, unspecified: Secondary | ICD-10-CM

## 2019-11-23 LAB — CBC WITH DIFFERENTIAL/PLATELET
Basophils Absolute: 0 10*3/uL (ref 0.0–0.1)
Basophils Relative: 0.6 % (ref 0.0–3.0)
Eosinophils Absolute: 0.1 10*3/uL (ref 0.0–0.7)
Eosinophils Relative: 1.5 % (ref 0.0–5.0)
HCT: 36.7 % (ref 36.0–46.0)
Hemoglobin: 12.3 g/dL (ref 12.0–15.0)
Lymphocytes Relative: 22.4 % (ref 12.0–46.0)
Lymphs Abs: 1.5 10*3/uL (ref 0.7–4.0)
MCHC: 33.5 g/dL (ref 30.0–36.0)
MCV: 88.4 fl (ref 78.0–100.0)
Monocytes Absolute: 0.6 10*3/uL (ref 0.1–1.0)
Monocytes Relative: 9.5 % (ref 3.0–12.0)
Neutro Abs: 4.4 10*3/uL (ref 1.4–7.7)
Neutrophils Relative %: 66 % (ref 43.0–77.0)
Platelets: 214 10*3/uL (ref 150.0–400.0)
RBC: 4.15 Mil/uL (ref 3.87–5.11)
RDW: 16.1 % — ABNORMAL HIGH (ref 11.5–15.5)
WBC: 6.6 10*3/uL (ref 4.0–10.5)

## 2019-11-23 LAB — COMPREHENSIVE METABOLIC PANEL
ALT: 13 U/L (ref 0–35)
AST: 15 U/L (ref 0–37)
Albumin: 4.2 g/dL (ref 3.5–5.2)
Alkaline Phosphatase: 46 U/L (ref 39–117)
BUN: 7 mg/dL (ref 6–23)
CO2: 27 mEq/L (ref 19–32)
Calcium: 9.1 mg/dL (ref 8.4–10.5)
Chloride: 102 mEq/L (ref 96–112)
Creatinine, Ser: 0.62 mg/dL (ref 0.40–1.20)
GFR: 112.71 mL/min (ref >60.00)
Glucose, Bld: 100 mg/dL — ABNORMAL HIGH (ref 70–99)
Potassium: 3.7 mEq/L (ref 3.5–5.1)
Sodium: 136 mEq/L (ref 135–145)
Total Bilirubin: 0.5 mg/dL (ref 0.2–1.2)
Total Protein: 7 g/dL (ref 6.0–8.3)

## 2019-11-23 LAB — C-REACTIVE PROTEIN: CRP: <1 mg/dL (ref 0.5–20.0)

## 2019-11-23 IMAGING — DX CHEST - 2 VIEW
2 series · 2 of 2 positions shown · non-contrast
Comparison: Radiographs December 02, 2017.

CLINICAL DATA: Cough.

EXAM:
CHEST - 2 VIEW

[chest pa]
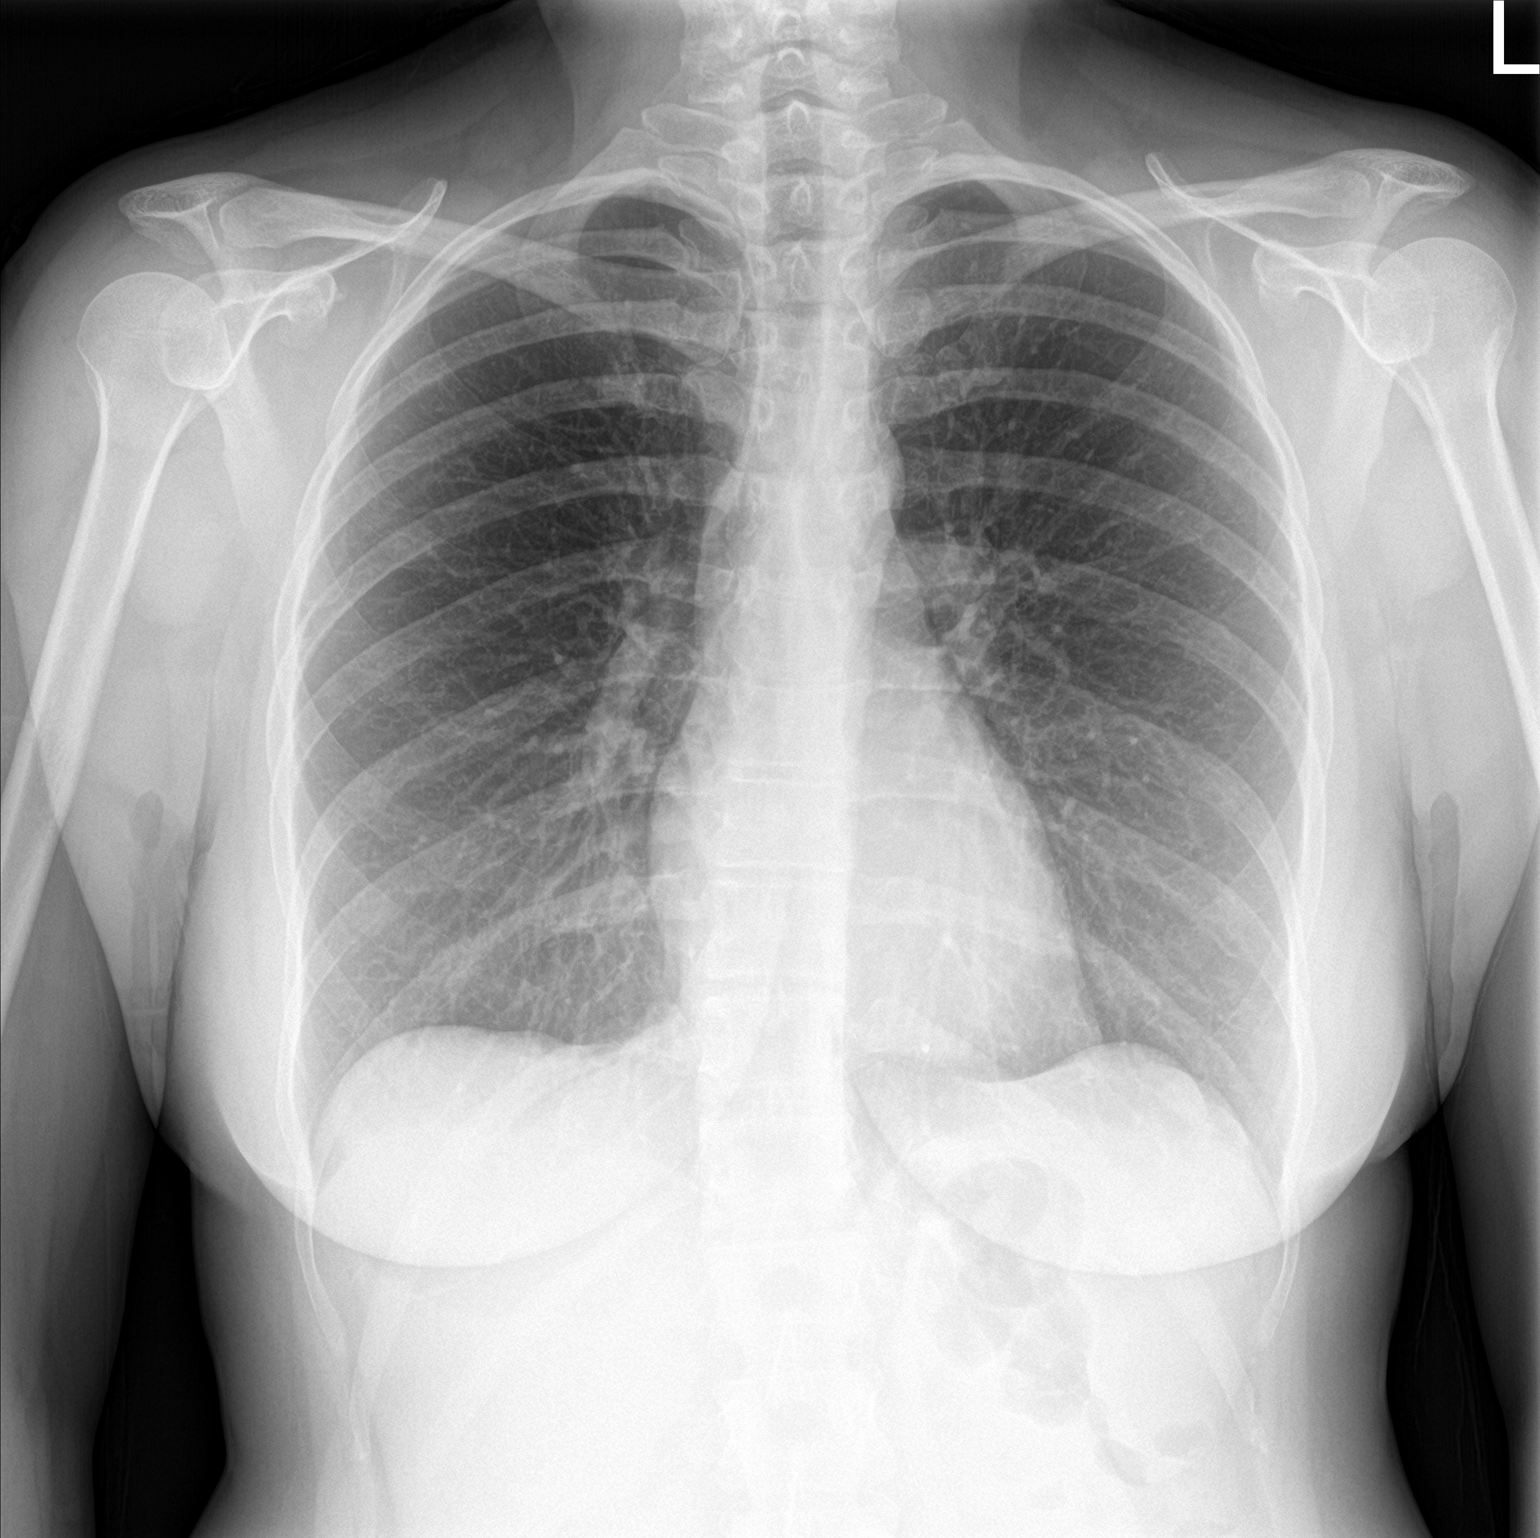

[chest lat]
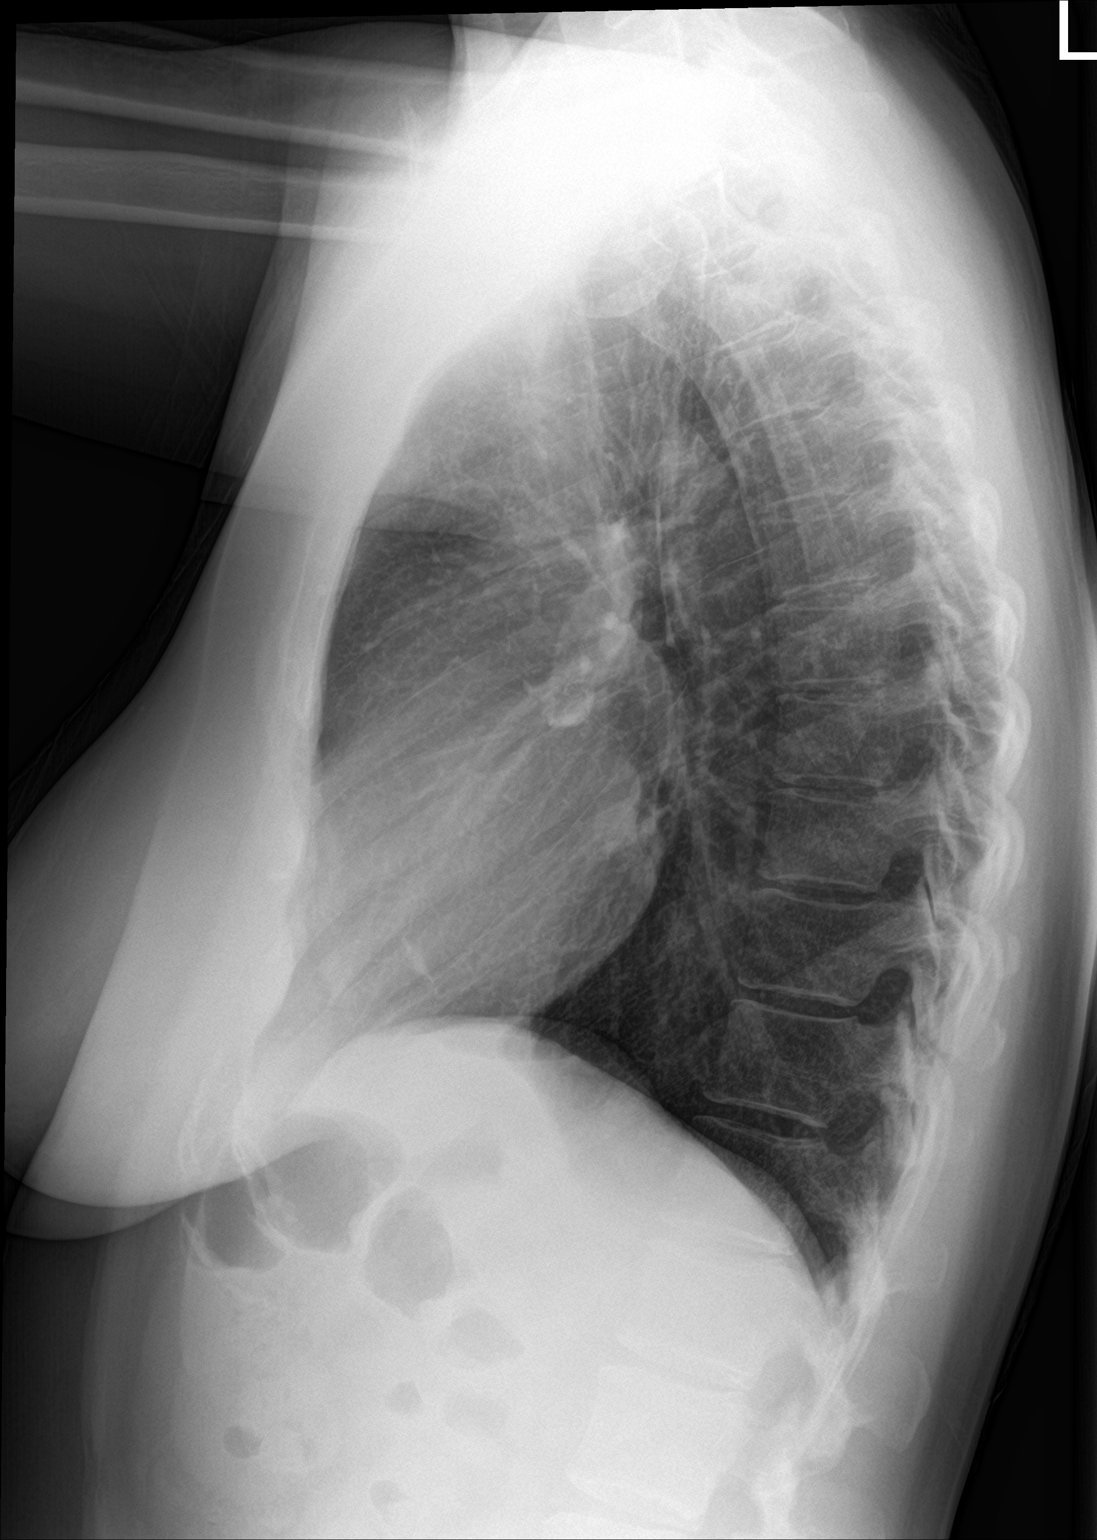

[2 of 2 positions shown; findings below may reference images not displayed]

FINDINGS: The heart size and mediastinal contours are within normal limits.
Both lungs are clear. The visualized skeletal structures are
unremarkable.
IMPRESSION: No active cardiopulmonary disease.

## 2019-11-23 MED ORDER — DICYCLOMINE HCL 10 MG PO CAPS: 10.0000 mg | ORAL_CAPSULE | Freq: Three times a day (TID) | ORAL | 1 refills | Status: DC | PRN

## 2019-11-23 NOTE — Patient Instructions (Signed)
Your provider has requested that you go to the basement level for lab work before leaving today. Press "B" on the elevator. The lab is located at the first door on the left as you exit the elevator.  Due to recent changes in healthcare laws, you may see the results of your imaging and laboratory studies on MyChart before your provider has had a chance to review them.  We understand that in some cases there may be results that are confusing or concerning to you. Not all laboratory results come back in the same time frame and the provider may be waiting for multiple results in order to interpret others.  Please give [place] 48 hours in order for your provider to thoroughly review all the results before contacting the office for clarification of your results.   We have sent the following medications to your pharmacy for you to pick up at your convenience: Dicyclomine   Purchase over the counter [place] Bacteria Probiotic and take one daily.  Call the office if symptoms worsen.  Follow up with [place] in 4-6 weeks.   I appreciate the opportunity to care for you. Alcide Evener, CRNP

## 2019-11-23 NOTE — Progress Notes (Signed)
11/23/2019 Angela Bryant [id number] September 17, 1980   Chief Complaint:  Diarrhea   History of Present Illness: Angela Bryant. Angela Bryant is a 39 year old female with a past medical history of anxiety, asthma, GERD, anal fissure 2017 and chronic constipation.  She is followed by Dr. Christella Bryant.  She was last seen in our office by Angela Gip PA-C on 10/29/2017.  At that time, her GERD symptoms were stable.  She was advised to continue Omeprazole 40 mg daily.  She developed diarrhea on Trulance which was discontinued. She was prescribed low-dose Linzess 72 mcg once daily for her constipation.  She underwent a colonoscopy 03/01/2013 which was normal. She presents to our office today for further evaluation regarding diarrhea which has been persistent for the past 2 months.  She describes passing brown watery nonbloody diarrhea 3-5 times most days with associated lower abdominal cramping and increased urgency.  She occasionally goes 1 day without having any bowel movement.  She reports seeing a small amount of bright red blood on the toilet tissue which typically occurs when she is constipated or when she passes numerous bowel movements.  She denies having bloody diarrhea.  No antibiotics in the past 6 months.  No new medications.  She stopped taking Linzess 2 to 3 months ago because it resulted in numerous bowel movements in the morning she was not conducive to her work schedule.  She has lost 20 pounds since May 2021.  Her stress level has been significantly increased since May 2021 as she and her husband are separated.  She is in the process of selling her home as well as buying a new home.  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 She is now driving 40 minutes to work and back home daily.  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  She was unable to go to work on Sunday 11/7 due to multiple frequent urgent bowel  movements in the morning.  She intends to apply for FMLA. She previously tried antianxiety medications as prescribed by her PCP which made her feel worse therefore she prefers to avoid any further antianxiety medications.  She takes Ibuprofen 200 mg 2-3 tabs several days monthly for menstrual cramping or headaches.  She took a BC powder 1 packet 3 days ago for a headache.  She drinks 1 large [employer] coffee most days.  No known family history of IBD or colon cancer.  No other complaints at this time.   Current Outpatient Medications on File Prior to Visit  Medication Sig Dispense Refill   ADVAIR DISKUS 500-50 MCG/DOSE AEPB INHALE 1 PUFF INTO THE LUNGS TWICE DAILY 60 each 2   albuterol (PROVENTIL HFA;VENTOLIN HFA) 108 (90 Base) MCG/ACT inhaler Inhale 2 puffs into the lungs every 6 (six) hours as needed. Reported on 02/01/2015 1 Inhaler 2   cetirizine (ZYRTEC) 10 MG tablet Take 1 tablet (10 mg total) by mouth daily. 90 tablet 0   diazepam (VALIUM) 5 MG tablet Take 1 tablet (5 mg total) by mouth every 12 (twelve) hours as needed for anxiety. 90 tablet 0   fluticasone (FLONASE) 50 MCG/ACT nasal spray Place 2 sprays into both nostrils daily. (Patient taking differently: Place 2 sprays into both nostrils as needed. ) 16 g 6   levonorgestrel (MIRENA) 20 MCG/24HR IUD 1 each by Intrauterine route once.     montelukast (SINGULAIR) 10 MG tablet Take  1 tablet (10 mg total) by mouth at bedtime. 30 tablet 1   omeprazole (PRILOSEC) 40 MG capsule Take 1 capsule (40 mg total) by mouth daily. 90 capsule 3   zolpidem (AMBIEN) 10 MG tablet Take 1 tablet (10 mg total) by mouth at bedtime. 90 tablet 0   EPINEPHrine (EPIPEN 2-PAK) 0.3 mg/0.3 mL IJ SOAJ injection Inject 0.3 mLs (0.3 mg total) into the muscle as needed for anaphylaxis. (Patient not taking: Reported on 11/23/2019) 2 each 1   linaclotide (LINZESS) 72 MCG capsule Take 1 capsule (72 mcg total) by mouth daily before breakfast. **APPOINTMENT NEEDED FOR  FURTHER REFILLS** (Patient not taking: Reported on 11/23/2019) 90 capsule 0   No current facility-administered medications on file prior to visit.   Allergies  Allergen Reactions   Codeine Nausea And Vomiting   Omnicef [Cefdinir] Hives   Other Other (See Comments)    Ethyl Cyanoacrylate- Positive patch test Paraphenylenediamine- Positive patch test    Current Medications, Allergies, Past Medical History, Past Surgical History, Family History and Social History were reviewed in [employer] record.   Review of Systems:   Constitutional: + weight loss. Negative for fever, sweats, chills or weight loss.  Respiratory: Negative for shortness of breath.   Cardiovascular: Negative for chest pain, palpitations and leg swelling.  Gastrointestinal: See HPI.  Musculoskeletal: Negative for back pain or muscle aches.  Neurological: Negative for dizziness, headaches or paresthesias.    Physical Exam: Ht 5' 5.75" (1.67 m) Comment: height measured without shoes   Wt 144 lb 8 oz (65.5 kg)    BMI 23.50 kg/m  General: Well developed 39 year old female in no acute distress. Head: Normocephalic and atraumatic. Eyes: No scleral icterus. Conjunctiva pink . Ears: Normal auditory acuity. Mouth: Dentition intact. No ulcers or lesions.  Lungs: Clear throughout to auscultation. Heart: Regular rate and rhythm, no murmur. Abdomen: Soft, nontender and nondistended. No masses or hepatomegaly. Normal bowel sounds x 4 quadrants.  Rectal: Patient declined rectal exam today. Musculoskeletal: Symmetrical with no gross deformities. Extremities: No edema. Neurological: Alert oriented x 4. No focal deficits.  Psychological: Alert and cooperative. Normal mood and affect  Assessment and Recommendations:  22.  39 year old female with nonbloody diarrhea with associated lower abdominal cramping with increased urgency for the past 2 months -GI stool pathogen, CBC, CMP and CRP.  Past celiac  serology was negative. -Dicyclomine 10 mg 1 p.o. 3 times daily to be taken 30 minutes before meals as needed -[place] bacteria probiotic once daily -Discussed scheduling a diagnostic colonoscopy if her symptoms persist/worsen -Push fluids, bland diet.  Reduce coffee intake to 8 ounces daily. -Follow-up in the office 4 to 6 weeks  2.  Rectal bleeding, intermittent.  History of an anal fissure with associated sentinel tag.  Patient declined rectal exam today, to further address rectal bleeding at the time of her next follow-up appointment.  3. Anxiety -Follow up with PCP

## 2019-11-24 ENCOUNTER — Telehealth: Payer: Self-pay | Admitting: Nurse Practitioner

## 2019-11-24 NOTE — Telephone Encounter (Signed)
C/o sensation of having "swallowed a rock and it is just sitting there." No coughing or regurgitation of food. States it is exteremly painful and made it very difficult to sleep last night. Standing makes her feel more comfortable. She denies any recent reflux or vomiting. On Omeprazole 40 mg daily without any missed doses. Able to eat and drink.  Your thoughts?

## 2019-11-24 NOTE — Progress Notes (Signed)
I agree with the above note, plan 

## 2019-11-24 NOTE — Telephone Encounter (Signed)
Patient called states she failed to mention while she was with Jill Side that about a week ago she ate something and it felt like it was stuck in her chest and then it went away but yesterday after once she left the office it happened again and she is needing some advise on that.

## 2019-11-25 ENCOUNTER — Other Ambulatory Visit: Payer: Self-pay

## 2019-11-25 DIAGNOSIS — R109 Unspecified abdominal pain: Secondary | ICD-10-CM

## 2019-11-25 DIAGNOSIS — K529 Noninfective gastroenteritis and colitis, unspecified: Secondary | ICD-10-CM

## 2019-11-25 MED ORDER — HYOSCYAMINE SULFATE 0.125 MG SL SUBL: 0.1250 mg | SUBLINGUAL_TABLET | Freq: Four times a day (QID) | SUBLINGUAL | 0 refills | Status: DC | PRN

## 2019-11-25 NOTE — Telephone Encounter (Signed)
Left a message to return my call.  Abd u/s is scheduled for 12/01/19 at 8:00 am arrive at 7:45 am to [facility] fasting after midnight. Rx sent to the pharmacy.

## 2019-11-25 NOTE — Telephone Encounter (Signed)
Angela Bryant, her CBC and CMP were ok. Please schedule her for a RUQ abdominal sonogram to evaluate the gallbladder. Send her RX for hyoscyamine 0.125mg  one tab dissolve under tongue Q 6 hrs PRN. If sx recur/worsen she may need an EGD. Continue Omeprazole. Thx

## 2019-11-29 ENCOUNTER — Encounter: Payer: Self-pay | Admitting: Family Medicine

## 2019-11-30 NOTE — Telephone Encounter (Signed)
Infusion clinic? °

## 2019-11-30 NOTE — Telephone Encounter (Signed)
Yes--  Refer to infusion clinic

## 2019-12-01 ENCOUNTER — Telehealth (HOSPITAL_COMMUNITY): Payer: Self-pay | Admitting: Family

## 2019-12-01 ENCOUNTER — Telehealth (HOSPITAL_COMMUNITY): Payer: Self-pay | Admitting: *Deleted

## 2019-12-01 ENCOUNTER — Ambulatory Visit (HOSPITAL_COMMUNITY): Payer: 59

## 2019-12-01 NOTE — Telephone Encounter (Signed)
Patient did not return my call. However, she called radiology and cancelled her abd u/s due to a positive COVID test on 11/30/19.

## 2019-12-01 NOTE — Telephone Encounter (Signed)
The infusion is meant to help get over it quicker --- we could refer to covid clinic but she would need to go there or at least do a virtual if antibiotic or prednisone is needed

## 2019-12-01 NOTE — Telephone Encounter (Signed)
Called to discuss with Gavin Pound about Covid symptoms and potential candidacy for the use of sotrovimab, a combination monoclonal antibody infusion for those with mild to moderate Covid symptoms and at a high risk of hospitalization.     Pt is qualified for this infusion at the infusion center due to co-morbid conditions and/or a member of an at-risk group, however unable to reach patient as call went directly to VM. Previous attempt by another provider noted.  Maylin [place]burg,NP

## 2019-12-02 ENCOUNTER — Telehealth: Payer: Self-pay | Admitting: Nurse Practitioner

## 2019-12-02 NOTE — Telephone Encounter (Signed)
Noted, I will address abd sono and further eval at the time of her follow up appt.

## 2019-12-02 NOTE — Telephone Encounter (Signed)
3rd Attempt  Called to Discuss with patient about Covid symptoms and the use of the monoclonal antibody infusion for those with mild to moderate Covid symptoms and at a high risk of hospitalization.     Pt appears to qualify for this infusion due to co-morbid conditions and/or a member of an at-risk group in accordance with the FDA Emergency Use Authorization. (asthma).   Of note previous My Chart message with her PCP patient indicated she was not interested in the infusion unless symptoms worsened. Multiple clinic providers have reached out and sent My Chart message (read by patient).    Unable to reach pt. Additional Voicemail has been left.   Willette Alma, NP [facility]  [phone]

## 2019-12-27 ENCOUNTER — Ambulatory Visit: Payer: 59 | Admitting: Nurse Practitioner

## 2020-01-17 ENCOUNTER — Other Ambulatory Visit: Payer: Self-pay | Admitting: Family Medicine

## 2020-01-17 DIAGNOSIS — G4700 Insomnia, unspecified: Secondary | ICD-10-CM

## 2020-01-17 NOTE — Telephone Encounter (Signed)
Requesting: Ambien 10mg  Contract: 09/11/2018 UDS: 09/11/2018 Last Visit: 11/02/2019 Next Visit: None Last Refill:  10/06/2019 #90 and 0RF  Please Advise

## 2020-01-22 ENCOUNTER — Other Ambulatory Visit: Payer: Self-pay | Admitting: Family Medicine

## 2020-01-22 DIAGNOSIS — J302 Other seasonal allergic rhinitis: Secondary | ICD-10-CM

## 2020-03-03 ENCOUNTER — Telehealth: Payer: 59 | Admitting: Family Medicine

## 2020-04-05 ENCOUNTER — Other Ambulatory Visit: Payer: Self-pay | Admitting: Family Medicine

## 2020-04-05 DIAGNOSIS — G4700 Insomnia, unspecified: Secondary | ICD-10-CM

## 2020-04-05 NOTE — Telephone Encounter (Signed)
Requesting: Ambien  Contract: 09/11/2018 UDS: 09/11/2018 Last OV: 11/02/2019 Next OV: N/A Last Refill: 01/17/2020, #90--0 RF Database:   Please advise

## 2020-07-04 ENCOUNTER — Ambulatory Visit: Payer: Self-pay | Admitting: Allergy

## 2020-07-10 ENCOUNTER — Other Ambulatory Visit: Payer: Self-pay | Admitting: Family Medicine

## 2020-07-10 DIAGNOSIS — G4700 Insomnia, unspecified: Secondary | ICD-10-CM

## 2020-07-11 NOTE — Telephone Encounter (Signed)
Requesting: Ambien Contract: 09/11/2018 UDS: 09/11/18 Last OV: 11/02/19 Next OV: N/A Last Refill: 04/06/20, #90-0 RF Database:   Please advise

## 2020-07-24 ENCOUNTER — Encounter: Payer: Self-pay | Admitting: Family Medicine

## 2020-07-26 ENCOUNTER — Other Ambulatory Visit: Payer: Self-pay

## 2020-07-26 MED ORDER — FLUCONAZOLE 150 MG PO TABS: ORAL_TABLET | ORAL | 0 refills | Status: DC

## 2020-08-02 ENCOUNTER — Other Ambulatory Visit: Payer: Self-pay | Admitting: Family Medicine

## 2020-08-02 DIAGNOSIS — K219 Gastro-esophageal reflux disease without esophagitis: Secondary | ICD-10-CM

## 2020-09-13 ENCOUNTER — Other Ambulatory Visit: Payer: Self-pay | Admitting: Family Medicine

## 2020-09-13 DIAGNOSIS — F419 Anxiety disorder, unspecified: Secondary | ICD-10-CM

## 2020-09-13 NOTE — Telephone Encounter (Signed)
Requesting: diazepam Contract: 09/11/2018 (Ambien only) UDS: 09/11/2018 Last Visit: 10/28/2018 (for anxiety) Next Visit: None Last Refill: 10/06/2019, #90, 0 RF  Please Advise  (Message sent to patient to schedule f/u appt)

## 2020-09-14 ENCOUNTER — Other Ambulatory Visit: Payer: Self-pay | Admitting: Family Medicine

## 2020-09-14 DIAGNOSIS — F419 Anxiety disorder, unspecified: Secondary | ICD-10-CM

## 2020-09-14 MED ORDER — DIAZEPAM 5 MG PO TABS: 5.0000 mg | ORAL_TABLET | Freq: Two times a day (BID) | ORAL | 0 refills | Status: DC | PRN

## 2020-09-25 ENCOUNTER — Other Ambulatory Visit: Payer: Self-pay | Admitting: Family Medicine

## 2020-09-25 DIAGNOSIS — G4700 Insomnia, unspecified: Secondary | ICD-10-CM

## 2020-09-25 NOTE — Telephone Encounter (Signed)
Requesting: Ambien  Contract: 09/10/20 UDS: 09/10/20 Last OV: 11/02/2019 Next OV: N/A Last Refill: 07/11/20, #90--0 RF Database:   Please advise

## 2020-12-12 ENCOUNTER — Other Ambulatory Visit: Payer: Self-pay | Admitting: Family Medicine

## 2020-12-12 DIAGNOSIS — G4700 Insomnia, unspecified: Secondary | ICD-10-CM

## 2020-12-12 NOTE — Telephone Encounter (Signed)
Requesting: Ambien Contract: 2020 UDS: 2020 Last OV: 11/02/2019 Next OV: N/A Last Refill: 09/25/2020, #90--0 RF Database:   Please advise

## 2021-01-02 ENCOUNTER — Ambulatory Visit: Payer: Self-pay | Admitting: Internal Medicine

## 2021-01-29 ENCOUNTER — Other Ambulatory Visit: Payer: Self-pay | Admitting: Family Medicine

## 2021-01-29 DIAGNOSIS — J302 Other seasonal allergic rhinitis: Secondary | ICD-10-CM

## 2021-01-29 DIAGNOSIS — K219 Gastro-esophageal reflux disease without esophagitis: Secondary | ICD-10-CM

## 2021-01-30 ENCOUNTER — Encounter: Payer: Self-pay | Admitting: Family Medicine

## 2021-01-30 DIAGNOSIS — J302 Other seasonal allergic rhinitis: Secondary | ICD-10-CM

## 2021-01-30 DIAGNOSIS — K219 Gastro-esophageal reflux disease without esophagitis: Secondary | ICD-10-CM

## 2021-01-30 MED ORDER — MONTELUKAST SODIUM 10 MG PO TABS: 10.0000 mg | ORAL_TABLET | Freq: Every day | ORAL | 0 refills | Status: DC

## 2021-01-30 MED ORDER — OMEPRAZOLE 40 MG PO CPDR: 40.0000 mg | DELAYED_RELEASE_CAPSULE | Freq: Every day | ORAL | 0 refills | Status: DC

## 2021-02-01 ENCOUNTER — Ambulatory Visit: Payer: Self-pay | Admitting: Family Medicine

## 2021-02-08 ENCOUNTER — Encounter: Payer: Self-pay | Admitting: Family Medicine

## 2021-02-08 ENCOUNTER — Telehealth (INDEPENDENT_AMBULATORY_CARE_PROVIDER_SITE_OTHER): Payer: BLUE CROSS/BLUE SHIELD | Admitting: Family Medicine

## 2021-02-08 DIAGNOSIS — J302 Other seasonal allergic rhinitis: Secondary | ICD-10-CM | POA: Diagnosis not present

## 2021-02-08 DIAGNOSIS — G4700 Insomnia, unspecified: Secondary | ICD-10-CM | POA: Diagnosis not present

## 2021-02-08 DIAGNOSIS — J4520 Mild intermittent asthma, uncomplicated: Secondary | ICD-10-CM | POA: Diagnosis not present

## 2021-02-08 DIAGNOSIS — K219 Gastro-esophageal reflux disease without esophagitis: Secondary | ICD-10-CM | POA: Diagnosis not present

## 2021-02-08 DIAGNOSIS — F419 Anxiety disorder, unspecified: Secondary | ICD-10-CM

## 2021-02-08 MED ORDER — MONTELUKAST SODIUM 10 MG PO TABS: 10.0000 mg | ORAL_TABLET | Freq: Every day | ORAL | 3 refills | Status: DC

## 2021-02-08 MED ORDER — OMEPRAZOLE 40 MG PO CPDR: 40.0000 mg | DELAYED_RELEASE_CAPSULE | Freq: Every day | ORAL | 3 refills | Status: DC

## 2021-02-08 NOTE — Assessment & Plan Note (Signed)
con't prn Angela Bryant

## 2021-02-08 NOTE — Assessment & Plan Note (Signed)
Stable con't prilosec 

## 2021-02-08 NOTE — Assessment & Plan Note (Signed)
con't prn valium

## 2021-02-08 NOTE — Assessment & Plan Note (Addendum)
Stable con't inhalers  con't singulair

## 2021-02-08 NOTE — Progress Notes (Signed)
MyChart Video Visit    Virtual Visit via Video Note   This visit type was conducted due to national recommendations for restrictions regarding the COVID-19 Pandemic (e.g. social distancing) in an effort to limit this patient's exposure and mitigate transmission in our community. This patient is at least at moderate risk for complications without adequate follow up. This format is felt to be most appropriate for this patient at this time. Physical exam was limited by quality of the video and audio technology used for the visit. Angela Bryant was able to get the patient set up on a video visit.  Patient location: home  Patient and provider in visit Provider location: Office  I discussed the limitations of evaluation and management by telemedicine and the availability of in person appointments. The patient expressed understanding and agreed to proceed.  Visit Date: 02/08/2021  Today's healthcare provider: Ann Held, DO     Subjective:    Patient ID: Angela Bryant, female    DOB: 09/04/1980, 41 y.o.   MRN: [id number]  Chief Complaint  Patient presents with   Gastroesophageal Reflux   Follow-up    HPI Patient is in today for f/u and med refills.  No complaints   Past Medical History:  Diagnosis Date   Anxiety    Asthma    GERD (gastroesophageal reflux disease)    Insomnia    Irritable bowel syndrome     Past Surgical History:  Procedure Laterality Date   ANAL RECTAL MANOMETRY N/A 01/04/2015   Procedure: ANO RECTAL MANOMETRY;  Surgeon: Mauri Pole, MD;  Location: [facility];  Service: Endoscopy;  Laterality: N/A;    Family History  Problem Relation Age of Onset   Hypertension Father    Asthma Mother     Social History   Socioeconomic History   Marital status: Married    Spouse name: Not on file   Number of children: 0   Years of education: Not on file   Highest education level: Not on file  Occupational History   Occupation: [employer]    Employer: [employer]  Tobacco Use   Smoking status: Light Smoker    Types: Cigarettes   Smokeless tobacco: Never   Tobacco comments:    rarely has a cigarrette, sometimes on weekends  Substance and Sexual Activity   Alcohol use: Yes    Alcohol/week: 0.0 standard drinks    Comment: occasional    Drug use: No   Sexual activity: Yes    Partners: Male  Other Topics Concern   Not on file  Social History Narrative   Not on file   Social Determinants of Health   Financial Resource Strain: Not on file  Food Insecurity: Not on file  Transportation Needs: Not on file  Physical Activity: Not on file  Stress: Not on file  Social Connections: Not on file  Intimate Partner Violence: Not on file    Outpatient Medications Prior to Visit  Medication Sig Dispense Refill   ADVAIR DISKUS 500-50 MCG/DOSE AEPB INHALE 1 PUFF INTO THE LUNGS TWICE DAILY 60 each 2   albuterol (PROVENTIL HFA;VENTOLIN HFA) 108 (90 Base) MCG/ACT inhaler Inhale 2 puffs into the lungs every 6 (six) hours as needed. Reported on 02/01/2015 1 Inhaler 2   cetirizine (ZYRTEC) 10 MG tablet Take 1 tablet (10 mg total) by mouth daily. 90 tablet 0   diazepam (VALIUM) 5 MG tablet Take 1 tablet (5 mg total) by mouth every 12 (twelve)  hours as needed for anxiety. 90 tablet 0   dicyclomine (BENTYL) 10 MG capsule Take 1 capsule (10 mg total) by mouth 3 (three) times daily as needed (Take 30 minutes before meals to prevent abdominal cramping). 30 capsule 1   fluconazole (DIFLUCAN) 150 MG tablet 1 tab po qd x 3d 3 tablet 0   fluticasone (FLONASE) 50 MCG/ACT nasal spray Place 2 sprays into both nostrils daily. (Patient taking differently: Place 2 sprays into both nostrils as needed.) 16 g 6   hyoscyamine (LEVSIN SL) 0.125 MG SL tablet Place 1 tablet (0.125 mg total) under the tongue every 6 (six) hours as needed. 30 tablet 0   levonorgestrel (MIRENA) 20 MCG/24HR IUD 1 each by Intrauterine route once.     zolpidem (AMBIEN) 10 MG  tablet TAKE ONE TABLET BY MOUTH AT BEDTIME 90 tablet 0   montelukast (SINGULAIR) 10 MG tablet Take 1 tablet (10 mg total) by mouth at bedtime. 30 tablet 0   omeprazole (PRILOSEC) 40 MG capsule Take 1 capsule (40 mg total) by mouth daily. 30 capsule 0   linaclotide (LINZESS) 72 MCG capsule Take 1 capsule (72 mcg total) by mouth daily before breakfast. **APPOINTMENT NEEDED FOR FURTHER REFILLS** (Patient not taking: Reported on 11/23/2019) 90 capsule 0   No facility-administered medications prior to visit.    Allergies  Allergen Reactions   Codeine Nausea And Vomiting   Omnicef [Cefdinir] Hives   Other Other (See Comments)    Ethyl Cyanoacrylate- Positive patch test Paraphenylenediamine- Positive patch test    Review of Systems  Constitutional:  Negative for fever and malaise/fatigue.  HENT:  Negative for congestion.   Eyes:  Negative for blurred vision.  Respiratory:  Negative for shortness of breath.   Cardiovascular:  Negative for chest pain, palpitations and leg swelling.  Gastrointestinal:  Negative for abdominal pain, blood in stool and nausea.  Genitourinary:  Negative for dysuria and frequency.  Musculoskeletal:  Negative for falls.  Skin:  Negative for rash.  Neurological:  Negative for dizziness, loss of consciousness and headaches.  Endo/Heme/Allergies:  Negative for environmental allergies.  Psychiatric/Behavioral:  Negative for depression. The patient is not nervous/anxious.       Objective:    Physical Exam Vitals and nursing note reviewed.  Pulmonary:     Effort: Pulmonary effort is normal.  Psychiatric:        Mood and Affect: Mood normal.        Behavior: Behavior normal.        Thought Content: Thought content normal.        Judgment: Judgment normal.    There were no vitals taken for this visit. Wt Readings from Last 3 Encounters:  11/23/19 144 lb 8 oz (65.5 kg)  11/02/19 150 lb (68 kg)  09/15/19 154 lb (69.9 kg)    Diabetic Foot Exam - Simple   No  data filed    Lab Results  Component Value Date   WBC 6.6 11/23/2019   HGB 12.3 11/23/2019   HCT 36.7 11/23/2019   PLT 214.0 11/23/2019   GLUCOSE 100 (H) 11/23/2019   ALT 13 11/23/2019   AST 15 11/23/2019   NA 136 11/23/2019   K 3.7 11/23/2019   CL 102 11/23/2019   CREATININE 0.62 11/23/2019   BUN 7 11/23/2019   CO2 27 11/23/2019   TSH 1.29 09/11/2018    Lab Results  Component Value Date   TSH 1.29 09/11/2018   Lab Results  Component Value Date   WBC  6.6 11/23/2019   HGB 12.3 11/23/2019   HCT 36.7 11/23/2019   MCV 88.4 11/23/2019   PLT 214.0 11/23/2019   Lab Results  Component Value Date   NA 136 11/23/2019   K 3.7 11/23/2019   CO2 27 11/23/2019   GLUCOSE 100 (H) 11/23/2019   BUN 7 11/23/2019   CREATININE 0.62 11/23/2019   BILITOT 0.5 11/23/2019   ALKPHOS 46 11/23/2019   AST 15 11/23/2019   ALT 13 11/23/2019   PROT 7.0 11/23/2019   ALBUMIN 4.2 11/23/2019   CALCIUM 9.1 11/23/2019   GFR 112.71 11/23/2019   No results found for: CHOL No results found for: HDL No results found for: LDLCALC No results found for: TRIG No results found for: CHOLHDL No results found for: HGBA1C     Assessment & Plan:   Problem List Items Addressed This Visit       Unprioritized   Anxiety    con't prn valium       Asthma    Stable con't inhalers  con't singulair      Relevant Medications   montelukast (SINGULAIR) 10 MG tablet   GERD    Stable  con't prilosec      Relevant Medications   omeprazole (PRILOSEC) 40 MG capsule   Insomnia - Primary    con't prn ambien       Other Visit Diagnoses     Gastroesophageal reflux disease       Relevant Medications   omeprazole (PRILOSEC) 40 MG capsule   Seasonal allergies       Relevant Medications   montelukast (SINGULAIR) 10 MG tablet       I have discontinued Angela Bryant's linaclotide. I am also having her maintain her levonorgestrel, Advair Diskus, albuterol, fluticasone, cetirizine,  dicyclomine, hyoscyamine, fluconazole, diazepam, zolpidem, omeprazole, and montelukast.  Meds ordered this encounter  Medications   omeprazole (PRILOSEC) 40 MG capsule    Sig: Take 1 capsule (40 mg total) by mouth daily.    Dispense:  90 capsule    Refill:  3   montelukast (SINGULAIR) 10 MG tablet    Sig: Take 1 tablet (10 mg total) by mouth at bedtime.    Dispense:  90 tablet    Refill:  3    I discussed the assessment and treatment plan with the patient. The patient was provided an opportunity to ask questions and all were answered. The patient agreed with the plan and demonstrated an understanding of the instructions.   The patient was advised to call back or seek an in-person evaluation if the symptoms worsen or if the condition fails to improve as anticipated.     Ann Held, DO [place] [facility] [phone] (phone) [phone] (fax)  [place]

## 2021-02-16 ENCOUNTER — Telehealth: Payer: Self-pay | Admitting: *Deleted

## 2021-02-16 DIAGNOSIS — Z79899 Other long term (current) drug therapy: Secondary | ICD-10-CM

## 2021-02-16 NOTE — Telephone Encounter (Signed)
Pt has lab appointment on 02/21/21 but I do not see any future orders.  Appt notes say UDS and CSC.  Can you place order for UDS and print letter for contract?

## 2021-02-16 NOTE — Telephone Encounter (Signed)
Lab ordered.

## 2021-02-16 NOTE — Telephone Encounter (Signed)
Yes

## 2021-02-16 NOTE — Telephone Encounter (Signed)
Thank you, did you also print the contract and place up front?

## 2021-02-21 ENCOUNTER — Other Ambulatory Visit: Payer: Self-pay

## 2021-02-22 ENCOUNTER — Other Ambulatory Visit: Payer: BLUE CROSS/BLUE SHIELD

## 2021-02-22 DIAGNOSIS — Z79899 Other long term (current) drug therapy: Secondary | ICD-10-CM

## 2021-02-24 LAB — DM TEMPLATE

## 2021-02-24 LAB — DRUG MONITORING PANEL 375977 , URINE

## 2021-02-27 ENCOUNTER — Encounter: Payer: Self-pay | Admitting: Family Medicine

## 2021-03-12 ENCOUNTER — Other Ambulatory Visit: Payer: Self-pay | Admitting: Family Medicine

## 2021-03-12 DIAGNOSIS — G4700 Insomnia, unspecified: Secondary | ICD-10-CM

## 2021-03-12 NOTE — Telephone Encounter (Signed)
Requesting: Ambien 10mg   Contract: 02/16/2021 UDS: 02/22/2021 Last Visit: 02/08/2021 Next Visit: None  Last Refill: 12/12/2020 #90 and 0RF  Please Advise

## 2021-04-12 ENCOUNTER — Encounter: Payer: Self-pay | Admitting: Family Medicine

## 2021-04-12 ENCOUNTER — Ambulatory Visit: Payer: BLUE CROSS/BLUE SHIELD | Admitting: Family Medicine

## 2021-04-12 ENCOUNTER — Telehealth: Payer: Self-pay | Admitting: Physician Assistant

## 2021-04-12 VITALS — BP 110/80 | HR 72 | Temp 98.3°F | Resp 18 | Ht 65.75 in | Wt 169.0 lb

## 2021-04-12 DIAGNOSIS — F419 Anxiety disorder, unspecified: Secondary | ICD-10-CM | POA: Diagnosis not present

## 2021-04-12 DIAGNOSIS — R635 Abnormal weight gain: Secondary | ICD-10-CM

## 2021-04-12 DIAGNOSIS — K5904 Chronic idiopathic constipation: Secondary | ICD-10-CM | POA: Diagnosis not present

## 2021-04-12 DIAGNOSIS — N912 Amenorrhea, unspecified: Secondary | ICD-10-CM | POA: Diagnosis not present

## 2021-04-12 DIAGNOSIS — K581 Irritable bowel syndrome with constipation: Secondary | ICD-10-CM | POA: Diagnosis not present

## 2021-04-12 LAB — HCG, SERUM, QUALITATIVE: Preg, Serum: NEGATIVE

## 2021-04-12 MED ORDER — LINACLOTIDE 72 MCG PO CAPS: 72.0000 ug | ORAL_CAPSULE | Freq: Every day | ORAL | 1 refills | Status: DC

## 2021-04-12 MED ORDER — DULOXETINE HCL 30 MG PO CPEP: 30.0000 mg | ORAL_CAPSULE | Freq: Every day | ORAL | 3 refills | Status: DC

## 2021-04-12 NOTE — Telephone Encounter (Signed)
Patient called to schedule an appointment to discuss IBS. Per patient, insurance will lapse tomorrow. I let the patient know that the soonest appointment would be early April. She wants to know if there is any way we can squeeze her in today. Please advise.  ?

## 2021-04-12 NOTE — Patient Instructions (Signed)
Stress, Adult °Stress is a normal reaction to life events. Stress is what you feel when life demands more than you are used to, or more than you think you can handle. °Some stress can be useful, such as studying for a test or meeting a deadline at work. Stress that occurs too often or for too long can cause problems. Long-lasting stress is called chronic stress. Chronic stress can affect your emotional health and interfere with relationships and normal daily activities. °Too much stress can weaken your body's defense system (immune system) and increase your risk for physical illness. If you already have a medical problem, stress can make it worse. °What are the causes? °All sorts of life events can cause stress. An event that causes stress for one person may not be stressful for someone else. Major life events, whether positive or negative, commonly cause stress. Examples include: °Losing a job or starting a new job. °Losing a loved one. °Moving to a new town or home. °Getting married or divorced. °Having a baby. °Getting injured or sick. °Less obvious life events can also cause stress, especially if they occur day after day or in combination with each other. Examples include: °Working long hours. °Driving in traffic. °Caring for children. °Being in debt. °Being in a difficult relationship. °What are the signs or symptoms? °Stress can cause emotional and physical symptoms and can lead to unhealthy behaviors. These include the following: °Emotional symptoms °Anxiety. This is feeling worried, afraid, on edge, overwhelmed, or out of control. °Anger, including irritation or impatience. °Depression. This is feeling sad, down, helpless, or guilty. °Trouble focusing, remembering, or making decisions. °Physical symptoms °Aches and pains. These may affect your head, neck, back, stomach, or other areas of your body. °Tight muscles or a clenched jaw. °Low energy. °Trouble sleeping. °Unhealthy behaviors °Eating to feel better  (overeating) or skipping meals. °Working too much or putting off tasks. °Smoking, drinking alcohol, or using drugs to feel better. °How is this diagnosed? °A stress disorder is diagnosed through an assessment by your health care provider. A stress disorder may be diagnosed based on: °Your symptoms and any stressful life events. °Your medical history. °Tests to rule out other causes of your symptoms. °Depending on your condition, your health care provider may refer you to a specialist for further evaluation. °How is this treated? °Stress management techniques are the recommended treatment for stress. Medicine is not typically recommended for treating stress. °Techniques to reduce your reaction to stressful life events include: °Identifying stress. Monitor yourself for symptoms of stress and notice what causes stress for you. These skills may help you to avoid or prepare for stressful events. °Managing time. Set your priorities, keep a calendar of events, and learn to say no. These actions can help you avoid taking on too much. °Techniques for dealing with stress include: °Rethinking the problem. Try to think realistically about stressful events rather than ignoring them or overreacting. Try to find the positives in a stressful situation rather than focusing on the negatives. °Exercise. Physical exercise can release both physical and emotional tension. The key is to find a form of exercise that you enjoy and do it regularly. °Relaxation techniques. These relax the body and mind. Find one or more that you enjoy and use the techniques regularly. Examples include: °Meditation, deep breathing, or progressive relaxation techniques. °Yoga or tai chi. °Biofeedback, mindfulness techniques, or journaling. °Listening to music, being in nature, or taking part in other hobbies. °Practicing a healthy lifestyle. Eat a balanced diet,   drink plenty of water, limit or avoid caffeine, and get plenty of sleep. °Having a strong support  network. Spend time with family, friends, or other people you enjoy being around. Express your feelings and talk things over with someone you trust. °Counseling or talk therapy with a mental health provider may help if you are having trouble managing stress by yourself. °Follow these instructions at home: °Lifestyle ° °Avoid drugs. °Do not use any products that contain nicotine or tobacco. These products include cigarettes, chewing tobacco, and vaping devices, such as e-cigarettes. If you need help quitting, ask your health care provider. °If you drink alcohol: °Limit how much you have to: °0-1 drink a day for women who are not pregnant. °0-2 drinks a day for men. °Know how much alcohol is in a drink. In the [place] one drink equals one 12 oz bottle of beer (355 mL), one 5 oz glass of wine (148 mL), or one 1½ oz glass of hard liquor (44 mL). °Do not use alcohol or drugs to relax. °Eat a balanced diet that includes fresh fruits and vegetables, whole grains, lean meats, fish, eggs, beans, and low-fat dairy. Avoid processed foods and foods high in added fat, sugar, and salt. °Exercise at least 30 minutes on 5 or more days each week. °Get 7-8 hours of sleep each night. °General instructions ° °Practice stress management techniques as told by your health care provider. °Drink enough fluid to keep your urine pale yellow. °Take over-the-counter and prescription medicines only as told by your health care provider. °Keep all follow-up visits. This is important. °Contact a health care provider if: °Your symptoms get worse. °You have new symptoms. °You feel overwhelmed by your problems and can no longer manage them by yourself. °Get help right away if: °You have thoughts of hurting yourself or others. °Get help right awayif you feel like you may hurt yourself or others, or have thoughts about taking your own life. Go to your nearest emergency room or: °Call 911. °Call the National Suicide Prevention Lifeline at 1-800-273-8255 or  988. This is open 24 hours a day. °Text the Crisis Text Line at 741741. °Summary °Stress is a normal reaction to life events. It can cause problems if it happens too often or for too long. °Practicing stress management techniques is the best way to treat stress. °Counseling or talk therapy with a mental health provider may help if you are having trouble managing stress by yourself. °This information is not intended to replace advice given to you by your health care provider. Make sure you discuss any questions you have with your health care provider. °Document Revised: 08/10/2020 Document Reviewed: 08/10/2020 °Elsevier Patient Education © 2022 [employer] ° °

## 2021-04-12 NOTE — Progress Notes (Signed)
? ?Subjective:  ? ?By signing my name below, I, Shehryar Baig, attest that this documentation has been prepared under the direction and in the presence of Donato SchultzLowne Chase, Laycee Fitzsimmons R, DO. 04/12/2021 ?   ? ? Patient ID: Angela Bryant, female    DOB: 03/31/1980, 41 y.o.   MRN: [id number] ? ?Chief Complaint  ?Patient presents with  ? Irritable Bowel Syndrome  ? ? ?HPI ?Patient is in today for a office visit.  ? ?She complains of constipation. She also feels bloated. She has taken linzess to manage her constipation in the past and found relief. She has tried miralax to manage her symptoms and found no relief. She thinks her symptoms are related to her anxiety and could be IBS. She is interested in taking linzess to manage her symptoms. ? ?She complains of anxiety. She started a new job recently and reports having stress from that. She also reports having anxiety from recently ending a stressful relationship. She has tried taking medication to manage her anxiety around 2020 but found her symptoms were worsening. She notes having panic attacks daily and elevated blood pressure while taking them. She thinks she may have been taking lexapro during that time. She has anxiety over taking medication to manage her symptoms. ? ?She notices her menstrual cycles are occurring abnormal times since she started her new job. She typically has them every 20 days but finds its been longer since her last menstrual cycle.  ? ?She complains of weight gain. She tested negative for a at home pregnancy test yesterday. She is not trying to become pregnant at this time. She is not eating more and is actually improving her diet by incorporating more fruits and vegetable. She is exercising more often by walking during her work break and using an exercise bike at night.  ?Wt Readings from Last 3 Encounters:  ?04/12/21 169 lb (76.7 kg)  ?11/23/19 144 lb 8 oz (65.5 kg)  ?11/02/19 150 lb (68 kg)  ? ? ? ?Past Medical History:  ?Diagnosis Date  ? Anxiety    ? Asthma   ? GERD (gastroesophageal reflux disease)   ? Insomnia   ? Irritable bowel syndrome   ? ? ?Past Surgical History:  ?Procedure Laterality Date  ? ANAL RECTAL MANOMETRY N/A 01/04/2015  ? Procedure: ANO RECTAL MANOMETRY;  Surgeon: Napoleon FormKavitha Nandigam V, MD;  Location: [facility];  Service: Endoscopy;  Laterality: N/A;  ? ? ?Family History  ?Problem Relation Age of Onset  ? Hypertension Father   ? Asthma Mother   ? ? ?Social History  ? ?Socioeconomic History  ? Marital status: Married  ?  Spouse name: Not on file  ? Number of children: 0  ? Years of education: Not on file  ? Highest education level: Not on file  ?Occupational History  ? 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  Employer: [employer]  ?Tobacco Use  ? Smoking status: Light Smoker  ?  Types: Cigarettes  ? Smokeless tobacco: Never  ? Tobacco comments:  ?  rarely has a cigarrette, sometimes on weekends  ?Substance and Sexual Activity  ? Alcohol use: Yes  ?  Alcohol/week: 0.0 standard drinks  ?  Comment: occasional   ? Drug use: No  ? Sexual activity: Yes  ?  Partners: Male  ?Other Topics Concern  ? Not on file  ?Social History Narrative  ? Not on file  ? ?Social Determinants of Health  ? ?Financial Resource Strain: Not on file  ?Food Insecurity: Not  on file  ?Transportation Needs: Not on file  ?Physical Activity: Not on file  ?Stress: Not on file  ?Social Connections: Not on file  ?Intimate Partner Violence: Not on file  ? ? ?Outpatient Medications Prior to Visit  ?Medication Sig Dispense Refill  ? ADVAIR DISKUS 500-50 MCG/DOSE AEPB INHALE 1 PUFF INTO THE LUNGS TWICE DAILY 60 each 2  ? albuterol (PROVENTIL HFA;VENTOLIN HFA) 108 (90 Base) MCG/ACT inhaler Inhale 2 puffs into the lungs every 6 (six) hours as needed. Reported on 02/01/2015 1 Inhaler 2  ? cetirizine (ZYRTEC) 10 MG tablet Take 1 tablet (10 mg total) by mouth daily. 90 tablet 0  ? diazepam (VALIUM) 5 MG tablet Take 1 tablet (5 mg total) by mouth every 12 (twelve) hours as needed for anxiety. 90 tablet 0  ?  dicyclomine (BENTYL) 10 MG capsule Take 1 capsule (10 mg total) by mouth 3 (three) times daily as needed (Take 30 minutes before meals to prevent abdominal cramping). 30 capsule 1  ? fluticasone (FLONASE) 50 MCG/ACT nasal spray Place 2 sprays into both nostrils daily. (Patient taking differently: Place 2 sprays into both nostrils as needed.) 16 g 6  ? hyoscyamine (LEVSIN SL) 0.125 MG SL tablet Place 1 tablet (0.125 mg total) under the tongue every 6 (six) hours as needed. 30 tablet 0  ? levonorgestrel (MIRENA) 20 MCG/24HR IUD 1 each by Intrauterine route once.    ? montelukast (SINGULAIR) 10 MG tablet Take 1 tablet (10 mg total) by mouth at bedtime. 90 tablet 3  ? omeprazole (PRILOSEC) 40 MG capsule Take 1 capsule (40 mg total) by mouth daily. 90 capsule 3  ? zolpidem (AMBIEN) 10 MG tablet TAKE ONE TABLET BY MOUTH AT BEDTIME 90 tablet 0  ? fluconazole (DIFLUCAN) 150 MG tablet 1 tab po qd x 3d (Patient not taking: Reported on 04/12/2021) 3 tablet 0  ? ?No facility-administered medications prior to visit.  ? ? ?Allergies  ?Allergen Reactions  ? Codeine Nausea And Vomiting  ? Omnicef [Cefdinir] Hives  ? Other Other (See Comments)  ?  Ethyl Cyanoacrylate- Positive patch test ?Paraphenylenediamine- Positive patch test  ? ? ?Review of Systems  ?Constitutional:   ?     (+)weight gain  ?Gastrointestinal:  Positive for constipation.  ?     (+)bloating  ?Psychiatric/Behavioral:    ?     (+)anxiety  ? ?   ?Objective:  ?  ?Physical Exam ?Constitutional:   ?   General: She is not in acute distress. ?   Appearance: Normal appearance. She is not ill-appearing.  ?HENT:  ?   Head: Normocephalic and atraumatic.  ?   Right Ear: External ear normal.  ?   Left Ear: External ear normal.  ?Eyes:  ?   Extraocular Movements: Extraocular movements intact.  ?   Pupils: Pupils are equal, round, and reactive to light.  ?Cardiovascular:  ?   Rate and Rhythm: Normal rate and regular rhythm.  ?   Heart sounds: Normal heart sounds. No murmur  heard. ?  No gallop.  ?Pulmonary:  ?   Effort: Pulmonary effort is normal. No respiratory distress.  ?   Breath sounds: Normal breath sounds. No wheezing or rales.  ?Abdominal:  ?   General: Bowel sounds are normal. There is no distension.  ?   Palpations: Abdomen is soft.  ?   Tenderness: There is no abdominal tenderness. There is no guarding.  ?Skin: ?   General: Skin is warm and dry.  ?Neurological:  ?  Mental Status: She is alert and oriented to person, place, and time.  ?Psychiatric:     ?   Judgment: Judgment normal.  ? ? ?BP 110/80 (BP Location: Left Arm, Patient Position: Sitting, Cuff Size: Normal)   Pulse 72   Temp 98.3 ?F (36.8 ?C) (Oral)   Resp 18   Ht 5' 5.75" (1.67 m)   Wt 169 lb (76.7 kg)   SpO2 98%   BMI 27.49 kg/m?  ?Wt Readings from Last 3 Encounters:  ?04/12/21 169 lb (76.7 kg)  ?11/23/19 144 lb 8 oz (65.5 kg)  ?11/02/19 150 lb (68 kg)  ? ? ?Diabetic Foot Exam - Simple   ?No data filed ?  ? ?Lab Results  ?Component Value Date  ? WBC 6.6 11/23/2019  ? HGB 12.3 11/23/2019  ? HCT 36.7 11/23/2019  ? PLT 214.0 11/23/2019  ? GLUCOSE 100 (H) 11/23/2019  ? ALT 13 11/23/2019  ? AST 15 11/23/2019  ? NA 136 11/23/2019  ? K 3.7 11/23/2019  ? CL 102 11/23/2019  ? CREATININE 0.62 11/23/2019  ? BUN 7 11/23/2019  ? CO2 27 11/23/2019  ? TSH 1.29 09/11/2018  ? ? ?Lab Results  ?Component Value Date  ? TSH 1.29 09/11/2018  ? ?Lab Results  ?Component Value Date  ? WBC 6.6 11/23/2019  ? HGB 12.3 11/23/2019  ? HCT 36.7 11/23/2019  ? MCV 88.4 11/23/2019  ? PLT 214.0 11/23/2019  ? ?Lab Results  ?Component Value Date  ? NA 136 11/23/2019  ? K 3.7 11/23/2019  ? CO2 27 11/23/2019  ? GLUCOSE 100 (H) 11/23/2019  ? BUN 7 11/23/2019  ? CREATININE 0.62 11/23/2019  ? BILITOT 0.5 11/23/2019  ? ALKPHOS 46 11/23/2019  ? AST 15 11/23/2019  ? ALT 13 11/23/2019  ? PROT 7.0 11/23/2019  ? ALBUMIN 4.2 11/23/2019  ? CALCIUM 9.1 11/23/2019  ? GFR 112.71 11/23/2019  ? ?No results found for: CHOL ?No results found for: HDL ?No results  found for: LDLCALC ?No results found for: TRIG ?No results found for: CHOLHDL ?No results found for: HGBA1C ? ?   ?Assessment & Plan:  ? ?Problem List Items Addressed This Visit   ? ?  ? Unprioritized  ? Anxiety  ?

## 2021-04-13 ENCOUNTER — Telehealth: Payer: Self-pay

## 2021-04-13 ENCOUNTER — Other Ambulatory Visit (INDEPENDENT_AMBULATORY_CARE_PROVIDER_SITE_OTHER): Payer: BLUE CROSS/BLUE SHIELD

## 2021-04-13 ENCOUNTER — Other Ambulatory Visit: Payer: Self-pay | Admitting: *Deleted

## 2021-04-13 DIAGNOSIS — R635 Abnormal weight gain: Secondary | ICD-10-CM | POA: Diagnosis not present

## 2021-04-13 LAB — CBC WITH DIFFERENTIAL/PLATELET
Basophils Absolute: 0 10*3/uL (ref 0.0–0.1)
Basophils Relative: 0.4 % (ref 0.0–3.0)
Eosinophils Absolute: 0.1 10*3/uL (ref 0.0–0.7)
Eosinophils Relative: 1.2 % (ref 0.0–5.0)
HCT: 36.5 % (ref 36.0–46.0)
Hemoglobin: 12.1 g/dL (ref 12.0–15.0)
Lymphocytes Relative: 20.8 % (ref 12.0–46.0)
Lymphs Abs: 1.8 10*3/uL (ref 0.7–4.0)
MCHC: 33.2 g/dL (ref 30.0–36.0)
MCV: 90.5 fl (ref 78.0–100.0)
Monocytes Absolute: 0.6 10*3/uL (ref 0.1–1.0)
Monocytes Relative: 6.7 % (ref 3.0–12.0)
Neutro Abs: 6 10*3/uL (ref 1.4–7.7)
Neutrophils Relative %: 70.9 % (ref 43.0–77.0)
Platelets: 221 10*3/uL (ref 150.0–400.0)
RBC: 4.03 Mil/uL (ref 3.87–5.11)
RDW: 14.3 % (ref 11.5–15.5)
WBC: 8.5 10*3/uL (ref 4.0–10.5)

## 2021-04-13 LAB — COMPREHENSIVE METABOLIC PANEL
ALT: 11 U/L (ref 0–35)
AST: 13 U/L (ref 0–37)
Albumin: 4.1 g/dL (ref 3.5–5.2)
Alkaline Phosphatase: 47 U/L (ref 39–117)
BUN: 6 mg/dL (ref 6–23)
CO2: 27 mEq/L (ref 19–32)
Calcium: 9.2 mg/dL (ref 8.4–10.5)
Chloride: 104 mEq/L (ref 96–112)
Creatinine, Ser: 0.66 mg/dL (ref 0.40–1.20)
GFR: 109.94 mL/min (ref >60.00)
Glucose, Bld: 118 mg/dL — ABNORMAL HIGH (ref 70–99)
Potassium: 3.9 mEq/L (ref 3.5–5.1)
Sodium: 139 mEq/L (ref 135–145)
Total Bilirubin: 0.3 mg/dL (ref 0.2–1.2)
Total Protein: 6.8 g/dL (ref 6.0–8.3)

## 2021-04-13 LAB — TSH: TSH: 1.64 u[IU]/mL (ref 0.35–5.50)

## 2021-04-13 LAB — VITAMIN B12: Vitamin B-12: 343 pg/mL (ref 211–911)

## 2021-04-13 LAB — VITAMIN D 25 HYDROXY (VIT D DEFICIENCY, FRACTURES): VITD: 33.65 ng/mL (ref 30.00–100.00)

## 2021-04-13 NOTE — Assessment & Plan Note (Signed)
Restart linzess and f/u GI ?

## 2021-04-13 NOTE — Telephone Encounter (Signed)
PA initiated via Covermymeds; KEY: [id number]. Awaiting determination.  ?

## 2021-04-13 NOTE — Assessment & Plan Note (Signed)
Pt has tried many ssris with side effects ?Start cymbalta 30 mg daily  ?F/u 1 month ?con't prn xanax ?Recommend counselor as well ?

## 2021-04-15 NOTE — Progress Notes (Signed)
Normal nonfasting labs

## 2021-04-16 NOTE — Telephone Encounter (Signed)
PA approved.

## 2021-04-23 ENCOUNTER — Encounter: Payer: Self-pay | Admitting: Family Medicine

## 2021-05-02 ENCOUNTER — Telehealth: Payer: Self-pay | Admitting: Family Medicine

## 2021-05-02 NOTE — Telephone Encounter (Signed)
Pt contacted billing regarding 3/30 OV statement. Billing advised pt to contact office and speak with her provider to have services coded over again. Pt states that provider is the one who wanted the labs done but put not medically necessary on services. Pt received billing statement from insurance company in the amount of $96.88 and stated she should not have high of a bill as she received, pt states she should only owe a couple of bucks. please advise.  ?

## 2021-05-08 NOTE — Telephone Encounter (Signed)
I have sent an email to Billing Leadership asking them to please reach out to pt to advise on this billing concern. ?

## 2021-05-28 NOTE — Telephone Encounter (Signed)
Pt states she has not received a telephone call from billing leadership explaining anything. Please advise  ?

## 2021-05-29 NOTE — Telephone Encounter (Signed)
I have sent another email to a different email address for Billing Leadership asking someone to reach out to the patient about her billing concern. ?

## 2021-05-31 ENCOUNTER — Encounter: Payer: Self-pay | Admitting: Family Medicine

## 2021-06-04 NOTE — Telephone Encounter (Signed)
I received an email from [employer] stating a corrected claim was submitted to [facility] for pt's IBSC dx but they are not sure they will accept that. Their policy says they allow for Inflammatory Bowel Disease. They also changed [id number] to see if they pay with a gastro dx.  All of this was done on 05/29/2021.

## 2021-06-25 NOTE — Telephone Encounter (Signed)
Pt has called office again regarding this billing issue.  Per [facility], she read the last note to pt and pt is concerned about this billing issue reflecting negatively on her credit report.  I have forwarded all emails to the Patient Accounting Manager and requested if someone on her team could please contact the patient regarding her concerns since we do not handle anything with patient billing at the provider office level.

## 2021-07-02 ENCOUNTER — Other Ambulatory Visit: Payer: Self-pay | Admitting: Family Medicine

## 2021-07-02 ENCOUNTER — Encounter: Payer: Self-pay | Admitting: Family Medicine

## 2021-07-02 DIAGNOSIS — G4700 Insomnia, unspecified: Secondary | ICD-10-CM

## 2021-07-02 DIAGNOSIS — F419 Anxiety disorder, unspecified: Secondary | ICD-10-CM

## 2021-07-02 NOTE — Telephone Encounter (Signed)
Requesting: diazepam and zolpidem Contract:   UDS: 02/22/21 Last Visit: 04/12/21 Next Visit: none Last Refill: diazepam 09/14/20 zolpidem 03/12/21  Please Advise

## 2021-07-03 ENCOUNTER — Other Ambulatory Visit: Payer: Self-pay | Admitting: Family Medicine

## 2021-07-03 DIAGNOSIS — G4700 Insomnia, unspecified: Secondary | ICD-10-CM

## 2021-07-03 DIAGNOSIS — F419 Anxiety disorder, unspecified: Secondary | ICD-10-CM

## 2021-07-03 MED ORDER — ZOLPIDEM TARTRATE 10 MG PO TABS: 10.0000 mg | ORAL_TABLET | Freq: Every day | ORAL | 1 refills | Status: DC

## 2021-07-03 MED ORDER — DIAZEPAM 5 MG PO TABS: 5.0000 mg | ORAL_TABLET | Freq: Two times a day (BID) | ORAL | 0 refills | Status: DC | PRN

## 2021-07-06 ENCOUNTER — Telehealth: Payer: Self-pay | Admitting: *Deleted

## 2021-07-06 ENCOUNTER — Encounter: Payer: Self-pay | Admitting: Family Medicine

## 2021-07-06 NOTE — Telephone Encounter (Signed)
Called insurance for a prior auth and they stated that they got a paid claim for 90 tabs.  Spoke with pharmacist and they will give insurance a call.

## 2021-07-20 ENCOUNTER — Encounter: Payer: Self-pay | Admitting: Family Medicine

## 2021-07-23 NOTE — Telephone Encounter (Signed)
Spoke with pharmacist at [facility] and they stated that the 15 tabs ran and pt can pick up in an hour.

## 2021-07-23 NOTE — Telephone Encounter (Signed)
Saw mychart message that patient did not get her medication.  Called insurance and spoke with Alona Bene and she ran another case for qty limitations.  She got an approval to where pt can get the remaining 15 and then the following refill she can get 30 tablets.    Case ID- [id number]- approval is good from 06/23/21-07/23/22 for 30 tablets a month.  If any further help is needed can call help desk at [phone].

## 2021-07-23 NOTE — Telephone Encounter (Signed)
Advised that she can pick up 15 tabs and after that she can get 30 tabs per month after that.  Advised also that pharmacy will take about and hour or so to get rx for 15 tabs ready.

## 2021-07-23 NOTE — Telephone Encounter (Signed)
See telephone note.

## 2021-08-28 ENCOUNTER — Encounter: Payer: Self-pay | Admitting: Family Medicine

## 2021-08-28 ENCOUNTER — Other Ambulatory Visit: Payer: Self-pay | Admitting: Family Medicine

## 2021-08-28 MED ORDER — LINACLOTIDE 145 MCG PO CAPS: 145.0000 ug | ORAL_CAPSULE | Freq: Every day | ORAL | 2 refills | Status: DC

## 2021-08-29 MED ORDER — LINACLOTIDE 145 MCG PO CAPS: 145.0000 ug | ORAL_CAPSULE | Freq: Every day | ORAL | 2 refills | Status: DC

## 2021-10-17 ENCOUNTER — Encounter: Payer: Self-pay | Admitting: Family Medicine

## 2021-10-17 DIAGNOSIS — G4700 Insomnia, unspecified: Secondary | ICD-10-CM

## 2021-10-17 NOTE — Telephone Encounter (Signed)
Requesting: Ambien 10mg   Contract: 02/16/21 UDS: 02/22/21  Last Visit: 04/12/21 Next Visit: None Last Refill: 07/03/21 #90 and 1RF  NEW PHARMACY  Please Advise

## 2021-10-18 MED ORDER — ZOLPIDEM TARTRATE 10 MG PO TABS: 10.0000 mg | ORAL_TABLET | Freq: Every day | ORAL | 1 refills | Status: DC

## 2021-10-22 ENCOUNTER — Encounter: Payer: Self-pay | Admitting: Family Medicine

## 2021-11-08 ENCOUNTER — Other Ambulatory Visit: Payer: Self-pay | Admitting: *Deleted

## 2021-11-08 DIAGNOSIS — K219 Gastro-esophageal reflux disease without esophagitis: Secondary | ICD-10-CM

## 2021-11-08 DIAGNOSIS — J302 Other seasonal allergic rhinitis: Secondary | ICD-10-CM

## 2021-11-08 MED ORDER — OMEPRAZOLE 40 MG PO CPDR: 40.0000 mg | DELAYED_RELEASE_CAPSULE | Freq: Every day | ORAL | 1 refills | Status: DC

## 2021-11-08 MED ORDER — MONTELUKAST SODIUM 10 MG PO TABS: 10.0000 mg | ORAL_TABLET | Freq: Every day | ORAL | 1 refills | Status: DC

## 2022-01-02 ENCOUNTER — Telehealth (INDEPENDENT_AMBULATORY_CARE_PROVIDER_SITE_OTHER): Payer: Commercial Managed Care - PPO | Admitting: Family Medicine

## 2022-01-02 ENCOUNTER — Encounter: Payer: Self-pay | Admitting: Family Medicine

## 2022-01-02 DIAGNOSIS — R051 Acute cough: Secondary | ICD-10-CM

## 2022-01-02 DIAGNOSIS — R52 Pain, unspecified: Secondary | ICD-10-CM

## 2022-01-02 MED ORDER — IBUPROFEN 800 MG PO TABS: 800.0000 mg | ORAL_TABLET | Freq: Three times a day (TID) | ORAL | 0 refills | Status: AC | PRN

## 2022-01-02 MED ORDER — DOXYCYCLINE HYCLATE 100 MG PO CAPS: 100.0000 mg | ORAL_CAPSULE | Freq: Two times a day (BID) | ORAL | 0 refills | Status: DC

## 2022-01-02 MED ORDER — PREDNISONE 10 MG PO TABS: 10.0000 mg | ORAL_TABLET | Freq: Every day | ORAL | 0 refills | Status: DC

## 2022-01-02 NOTE — Progress Notes (Signed)
[facility] [address], [address] [place], [place] [address] [phone]  Date:  01/02/2022   Name:  Angela Bryant   DOB:  Jan 29, 1980   MRN:  [id number]  PCP:  Donato Schultz, DO    Chief Complaint: URI   History of Present Illness:  Angela Bryant is a 41 y.o. very pleasant female patient who presents with the following:  Virtual visit today for concern of illness - her PCP is Dr Zola Button  Pt is at work, I am at my office Pt ID confirmed with 2 factors, she gives consent for a virtual visit today.  The patient myself are present on this video conference  She notes that Thanksgiving week she had covid She got better but has continued to have a cough- never got back to 100% Now over the last 4-5 days she has noted body aches, still coughing like before but perhaps getting a bit worse She is not quite sure about fever- she did not check her temp-however she has noted chills As of yesterday the cough is more dry and she has noted wheezing (she does have asthma) She is on Advair and albuterol prn  She denies shortness of breath or distress. She has not yet tested for the flu Denies any chance of pregnancy at this time   Patient Active Problem List   Diagnosis Date Noted   Insomnia 02/08/2021   Diarrhea 11/23/2019   Gastroenteritis 11/02/2019   Palpitations 09/11/2018   Moderate asthma with acute exacerbation 03/28/2018   Anxiety 09/08/2017   Chronic idiopathic constipation 06/10/2013   ALLERGIC RHINITIS 04/14/2006   Asthma 04/14/2006   GERD 04/14/2006    Past Medical History:  Diagnosis Date   Anxiety    Asthma    GERD (gastroesophageal reflux disease)    Insomnia    Irritable bowel syndrome     Past Surgical History:  Procedure Laterality Date   ANAL RECTAL MANOMETRY N/A 01/04/2015   Procedure: ANO RECTAL MANOMETRY;  Surgeon: Napoleon Form, MD;  Location: [facility];  Service:  Endoscopy;  Laterality: N/A;    Social History   Tobacco Use   Smoking status: Light Smoker    Types: Cigarettes   Smokeless tobacco: Never   Tobacco comments:    rarely has a cigarrette, sometimes on weekends  Substance Use Topics   Alcohol use: Yes    Alcohol/week: 0.0 standard drinks of alcohol    Comment: occasional    Drug use: No    Family History  Problem Relation Age of Onset   Hypertension Father    Asthma Mother     Allergies  Allergen Reactions   Codeine Nausea And Vomiting   Omnicef [Cefdinir] Hives   Other Other (See Comments)    Ethyl Cyanoacrylate- Positive patch test Paraphenylenediamine- Positive patch test    Medication list has been reviewed and updated.  Current Outpatient Medications on File Prior to Visit  Medication Sig Dispense Refill   ADVAIR DISKUS 500-50 MCG/DOSE AEPB INHALE 1 PUFF INTO THE LUNGS TWICE DAILY 60 each 2   albuterol (PROVENTIL HFA;VENTOLIN HFA) 108 (90 Base) MCG/ACT inhaler Inhale 2 puffs into the lungs every 6 (six) hours as needed. Reported on 02/01/2015 1 Inhaler 2   cetirizine (ZYRTEC) 10 MG tablet Take 1 tablet (10 mg total) by mouth daily. 90 tablet 0   diazepam (VALIUM) 5 MG tablet Take 1 tablet (5 mg total) by  mouth every 12 (twelve) hours as needed for anxiety. 90 tablet 0   DULoxetine (CYMBALTA) 30 MG capsule Take 1 capsule (30 mg total) by mouth daily. 90 capsule 3   fluticasone (FLONASE) 50 MCG/ACT nasal spray Place 2 sprays into both nostrils daily. (Patient taking differently: Place 2 sprays into both nostrils as needed.) 16 g 6   levonorgestrel (MIRENA) 20 MCG/24HR IUD 1 each by Intrauterine route once.     linaclotide (LINZESS) 145 MCG CAPS capsule Take 1 capsule (145 mcg total) by mouth daily before breakfast. 30 capsule 2   montelukast (SINGULAIR) 10 MG tablet Take 1 tablet (10 mg total) by mouth at bedtime. 90 tablet 1   omeprazole (PRILOSEC) 40 MG capsule Take 1 capsule (40 mg total) by mouth daily. 90 capsule 1    zolpidem (AMBIEN) 10 MG tablet Take 1 tablet (10 mg total) by mouth at bedtime. 90 tablet 1   No current facility-administered medications on file prior to visit.    Review of Systems:  As per HPI- otherwise negative.   Physical Examination: There were no vitals filed for this visit. There were no vitals filed for this visit. There is no height or weight on file to calculate BMI. Ideal Body Weight:    Patient observed via video monitor.  She looks well except perhaps a bit tired, nontoxic, no shortness of breath noted.  Coughing occasionally  Assessment and Plan: Acute cough - Plan: doxycycline (VIBRAMYCIN) 100 MG capsule, predniSONE (DELTASONE) 10 MG tablet  Virtual visit today to discuss cough since Thanksgiving, recent COVID-19, now with bodyaches and chills.  Advised patient that she may have influenza, she could potentially be tested at an urgent care near her if she would like.  However, as has been sick now for several days already Tamiflu is less likely to be helpful.  We decided to go ahead and cover her with doxycycline for any bacterial etiology, will also trial low-dose of prednisone for her cough and wheezing.  Patient notes she tends to be quite sensitive to prednisone, advise she can decrease her dose or stop taking it if necessary.  She will keep [place] posted and let me know if anything further is needed or if she is not getting better Signed Lamar Blinks, MD

## 2022-01-09 ENCOUNTER — Encounter: Payer: Self-pay | Admitting: Family Medicine

## 2022-01-09 DIAGNOSIS — G4700 Insomnia, unspecified: Secondary | ICD-10-CM

## 2022-01-10 MED ORDER — ZOLPIDEM TARTRATE 10 MG PO TABS: 10.0000 mg | ORAL_TABLET | Freq: Every day | ORAL | 1 refills | Status: DC

## 2022-01-10 NOTE — Telephone Encounter (Signed)
Requesting: Ambien  Contract: 02/22/2021 UDS: 02/22/2021 Last OV: 01/02/2022, vv Next OV: N/A Last Refill: 10/18/2021, refilled for 90 but patient only got 30. Database:   Please advise

## 2022-01-10 NOTE — Addendum Note (Signed)
Addended by: Roxanne Gates on: 01/10/2022 09:18 AM   Modules accepted: Orders

## 2022-01-30 ENCOUNTER — Encounter: Payer: Self-pay | Admitting: Family Medicine

## 2022-01-30 MED ORDER — LINACLOTIDE 145 MCG PO CAPS: 145.0000 ug | ORAL_CAPSULE | Freq: Every day | ORAL | 2 refills | Status: DC

## 2022-01-31 MED ORDER — LUBIPROSTONE 8 MCG PO CAPS: 8.0000 ug | ORAL_CAPSULE | Freq: Two times a day (BID) | ORAL | 0 refills | Status: DC

## 2022-01-31 NOTE — Addendum Note (Signed)
Addended byDamita Dunnings D on: 01/31/2022 01:29 PM   Modules accepted: Orders

## 2022-02-05 ENCOUNTER — Encounter: Payer: Self-pay | Admitting: Family Medicine

## 2022-02-05 ENCOUNTER — Other Ambulatory Visit: Payer: Self-pay | Admitting: Family Medicine

## 2022-02-05 ENCOUNTER — Ambulatory Visit (HOSPITAL_BASED_OUTPATIENT_CLINIC_OR_DEPARTMENT_OTHER)
Admission: RE | Admit: 2022-02-05 | Discharge: 2022-02-05 | Disposition: A | Discharge status: Home / Self Care | Payer: Commercial Managed Care - PPO | Source: Ambulatory Visit | Attending: Family Medicine | Admitting: Family Medicine

## 2022-02-05 ENCOUNTER — Ambulatory Visit (INDEPENDENT_AMBULATORY_CARE_PROVIDER_SITE_OTHER): Payer: Commercial Managed Care - PPO | Admitting: Family Medicine

## 2022-02-05 VITALS — BP 120/90 | HR 83 | Temp 98.7°F | Resp 18 | Ht 65.75 in | Wt 172.4 lb

## 2022-02-05 DIAGNOSIS — K581 Irritable bowel syndrome with constipation: Secondary | ICD-10-CM | POA: Diagnosis not present

## 2022-02-05 DIAGNOSIS — K5641 Fecal impaction: Secondary | ICD-10-CM | POA: Diagnosis not present

## 2022-02-05 DIAGNOSIS — R109 Unspecified abdominal pain: Secondary | ICD-10-CM | POA: Diagnosis not present

## 2022-02-05 DIAGNOSIS — R8290 Unspecified abnormal findings in urine: Secondary | ICD-10-CM

## 2022-02-05 LAB — POC URINALSYSI DIPSTICK (AUTOMATED)
Bilirubin, UA: NEGATIVE
Blood, UA: NEGATIVE
Glucose, UA: NEGATIVE
Ketones, UA: NEGATIVE
Nitrite, UA: NEGATIVE
Protein, UA: NEGATIVE
Spec Grav, UA: 1.01 (ref 1.010–1.025)
Urobilinogen, UA: 0.2 E.U./dL
pH, UA: 6.5 (ref 5.0–8.0)

## 2022-02-05 LAB — POCT URINE PREGNANCY: Preg Test, Ur: NEGATIVE

## 2022-02-05 MED ORDER — IOHEXOL 300 MG/ML  SOLN
100.0000 mL | Freq: Once | INTRAMUSCULAR | Status: AC | PRN
  Administered 2022-02-05: 100 mL via INTRAVENOUS

## 2022-02-05 NOTE — Assessment & Plan Note (Signed)
With abd pain  Check labs / urine Check Ct abd pelvis but pt has not had a normal bm in several days and it hurst to sit/ stand , walk etc

## 2022-02-05 NOTE — Progress Notes (Addendum)
Subjective:   By signing my name below, I, Daiva Huge, attest that this documentation has been prepared under the direction and in the presence of Ann Held, DO 02/05/22   Patient ID: Angela Bryant, female    DOB: 20-Jul-1980, 42 y.o.   MRN: [id number]  Chief Complaint  Patient presents with   Abdominal Pain    Sxs started yesterday evening, Pt states was unable to stand up straight.     Abdominal Pain Associated symptoms include constipation and nausea. Pertinent negatives include no diarrhea, dysuria, fever, frequency or vomiting.   Patient is in today for an office visit.  Stomach Pain She started experiencing severe stomach pain last night that has remained constant. She describes the pain as a pressure on her stomach. It is painful to sit and stand. She has not been able to eat due to symptoms. She denies fever or diarrhea. She had a bowel movement this morning which relieved some of the pain but reports her stool has been in the form of hard, small pellets. She has had only one normal bowel movement in the past week. She tried taking Linzess last night without relief. She has a history of IBS-C.  She is having trouble getting insurance coverage for her medications.   Past Medical History:  Diagnosis Date   Anxiety    Asthma    GERD (gastroesophageal reflux disease)    Insomnia    Irritable bowel syndrome     Past Surgical History:  Procedure Laterality Date   ANAL RECTAL MANOMETRY N/A 01/04/2015   Procedure: ANO RECTAL MANOMETRY;  Surgeon: Mauri Pole, MD;  Location: [facility];  Service: Endoscopy;  Laterality: N/A;    Family History  Problem Relation Age of Onset   Hypertension Father    Asthma Mother     Social History   Socioeconomic History   Marital status: Legally Separated    Spouse name: Not on file   Number of children: 0   Years of education: Not on file   Highest education level: Not on file  Occupational  History   Occupation: [employer]    Employer: [employer]  Tobacco Use   Smoking status: Light Smoker    Types: Cigarettes   Smokeless tobacco: Never   Tobacco comments:    rarely has a cigarrette, sometimes on weekends  Substance and Sexual Activity   Alcohol use: Yes    Alcohol/week: 0.0 standard drinks of alcohol    Comment: occasional    Drug use: No   Sexual activity: Yes    Partners: Male  Other Topics Concern   Not on file  Social History Narrative   Not on file   Social Determinants of Health   Financial Resource Strain: Not on file  Food Insecurity: Not on file  Transportation Needs: Not on file  Physical Activity: Not on file  Stress: Not on file  Social Connections: Not on file  Intimate Partner Violence: Not on file    Outpatient Medications Prior to Visit  Medication Sig Dispense Refill   ADVAIR DISKUS 500-50 MCG/DOSE AEPB INHALE 1 PUFF INTO THE LUNGS TWICE DAILY 60 each 2   albuterol (PROVENTIL HFA;VENTOLIN HFA) 108 (90 Base) MCG/ACT inhaler Inhale 2 puffs into the lungs every 6 (six) hours as needed. Reported on 02/01/2015 1 Inhaler 2   cetirizine (ZYRTEC) 10 MG tablet Take 1 tablet (10 mg total) by mouth daily. 90 tablet 0   diazepam (VALIUM) 5 MG tablet  Take 1 tablet (5 mg total) by mouth every 12 (twelve) hours as needed for anxiety. 90 tablet 0   fluticasone (FLONASE) 50 MCG/ACT nasal spray Place 2 sprays into both nostrils daily. (Patient taking differently: Place 2 sprays into both nostrils as needed.) 16 g 6   ibuprofen (ADVIL) 800 MG tablet Take 1 tablet (800 mg total) by mouth every 8 (eight) hours as needed. 30 tablet 0   montelukast (SINGULAIR) 10 MG tablet Take 1 tablet (10 mg total) by mouth at bedtime. 90 tablet 1   omeprazole (PRILOSEC) 40 MG capsule Take 1 capsule (40 mg total) by mouth daily. 90 capsule 1   predniSONE (DELTASONE) 10 MG tablet Take 1 tablet (10 mg total) by mouth daily with breakfast. Take 7 tablet 0   zolpidem (AMBIEN) 10 MG tablet  Take 1 tablet (10 mg total) by mouth at bedtime. 90 tablet 1   lubiprostone (AMITIZA) 8 MCG capsule Take 1 capsule (8 mcg total) by mouth 2 (two) times daily with a meal. 60 capsule 0   doxycycline (VIBRAMYCIN) 100 MG capsule Take 1 capsule (100 mg total) by mouth 2 (two) times daily. 20 capsule 0   DULoxetine (CYMBALTA) 30 MG capsule Take 1 capsule (30 mg total) by mouth daily. 90 capsule 3   levonorgestrel (MIRENA) 20 MCG/24HR IUD 1 each by Intrauterine route once.     No facility-administered medications prior to visit.    Allergies  Allergen Reactions   Codeine Nausea And Vomiting   Omnicef [Cefdinir] Hives   Other Other (See Comments)    Ethyl Cyanoacrylate- Positive patch test Paraphenylenediamine- Positive patch test    Review of Systems  Constitutional:  Negative for fever.  Gastrointestinal:  Positive for abdominal pain (Constant stomach pain), constipation and nausea. Negative for blood in stool, diarrhea, heartburn and vomiting.  Genitourinary:  Negative for dysuria, frequency and urgency.       Objective:    Physical Exam Vitals and nursing note reviewed.  Constitutional:      General: She is not in acute distress.    Appearance: Normal appearance. She is not ill-appearing.  HENT:     Head: Normocephalic and atraumatic.     Right Ear: External ear normal.     Left Ear: External ear normal.  Eyes:     Extraocular Movements: Extraocular movements intact.     Pupils: Pupils are equal, round, and reactive to light.  Cardiovascular:     Rate and Rhythm: Normal rate and regular rhythm.     Heart sounds: Normal heart sounds. No murmur heard.    No gallop.  Pulmonary:     Effort: Pulmonary effort is normal. No respiratory distress.     Breath sounds: Normal breath sounds. No wheezing or rales.  Abdominal:     General: Abdomen is flat. Bowel sounds are normal.     Palpations: Abdomen is soft.     Tenderness: There is abdominal tenderness (Lower abdomen) in the  right lower quadrant, suprapubic area and left lower quadrant. There is guarding. There is no rebound.     Hernia: No hernia is present.     Comments: Lower abdominal tenderness with palpation but no rebounding or guarding.  Skin:    General: Skin is warm and dry.  Neurological:     Mental Status: She is alert and oriented to person, place, and time.  Psychiatric:        Judgment: Judgment normal.     BP (!) 120/90 (BP Location: Left  Arm, Patient Position: Sitting, Cuff Size: Normal)   Pulse 83   Temp 98.7 F (37.1 C) (Oral)   Resp 18   Ht 5' 5.75" (1.67 m)   Wt 172 lb 6.4 oz (78.2 kg)   SpO2 99%   BMI 28.04 kg/m  Wt Readings from Last 3 Encounters:  02/05/22 172 lb 6.4 oz (78.2 kg)  04/12/21 169 lb (76.7 kg)  11/23/19 144 lb 8 oz (65.5 kg)       Assessment & Plan:  Irritable bowel syndrome with constipation Assessment & Plan: With abd pain  Check labs / urine Check Ct abd pelvis but pt has not had a normal bm in several days and it hurst to sit/ stand , walk etc    Orders: -     Ambulatory referral to Gastroenterology  Abdominal pain, unspecified abdominal location -     CBC with Differential/Platelet -     Comprehensive metabolic panel -     Amylase -     Lipase -     TSH -     POCT Urinalysis Dipstick (Automated) -     POCT urine pregnancy  Fecal impaction (HCC) -     CT ABDOMEN PELVIS W CONTRAST; Future -     POCT urine pregnancy     I,Alexander Ruley,acting as a scribe for Ann Held, DO.,have documented all relevant documentation on the behalf of Ann Held, DO,as directed by  Ann Held, DO while in the presence of [place], DO, personally preformed the services described in this documentation.  All medical record entries made by the scribe were at my direction and in my presence.  I have reviewed the chart and discharge instructions (if applicable) and agree that the record  reflects my personal performance and is accurate and complete. 02/05/22   Ann Held, DO

## 2022-02-06 ENCOUNTER — Encounter: Payer: Self-pay | Admitting: Family Medicine

## 2022-02-06 ENCOUNTER — Telehealth: Payer: Self-pay | Admitting: Family Medicine

## 2022-02-06 LAB — CBC WITH DIFFERENTIAL/PLATELET
Basophils Absolute: 0.1 10*3/uL (ref 0.0–0.1)
Basophils Relative: 0.5 % (ref 0.0–3.0)
Eosinophils Absolute: 0.1 10*3/uL (ref 0.0–0.7)
Eosinophils Relative: 0.8 % (ref 0.0–5.0)
HCT: 34.4 % — ABNORMAL LOW (ref 36.0–46.0)
Hemoglobin: 11.3 g/dL — ABNORMAL LOW (ref 12.0–15.0)
Lymphocytes Relative: 14.8 % (ref 12.0–46.0)
Lymphs Abs: 1.8 10*3/uL (ref 0.7–4.0)
MCHC: 32.7 g/dL (ref 30.0–36.0)
MCV: 89.3 fl (ref 78.0–100.0)
Monocytes Absolute: 0.9 10*3/uL (ref 0.1–1.0)
Monocytes Relative: 7.1 % (ref 3.0–12.0)
Neutro Abs: 9.4 10*3/uL — ABNORMAL HIGH (ref 1.4–7.7)
Neutrophils Relative %: 76.8 % (ref 43.0–77.0)
Platelets: 241 10*3/uL (ref 150.0–400.0)
RBC: 3.86 Mil/uL — ABNORMAL LOW (ref 3.87–5.11)
RDW: 14.6 % (ref 11.5–15.5)
WBC: 12.2 10*3/uL — ABNORMAL HIGH (ref 4.0–10.5)

## 2022-02-06 LAB — TSH: TSH: 1.72 u[IU]/mL (ref 0.35–5.50)

## 2022-02-06 LAB — COMPREHENSIVE METABOLIC PANEL
ALT: 11 U/L (ref 0–35)
AST: 11 U/L (ref 0–37)
Albumin: 3.9 g/dL (ref 3.5–5.2)
Alkaline Phosphatase: 53 U/L (ref 39–117)
BUN: 7 mg/dL (ref 6–23)
CO2: 27 mEq/L (ref 19–32)
Calcium: 8.9 mg/dL (ref 8.4–10.5)
Chloride: 102 mEq/L (ref 96–112)
Creatinine, Ser: 0.58 mg/dL (ref 0.40–1.20)
GFR: 112.77 mL/min (ref >60.00)
Glucose, Bld: 84 mg/dL (ref 70–99)
Potassium: 4 mEq/L (ref 3.5–5.1)
Sodium: 137 mEq/L (ref 135–145)
Total Bilirubin: 0.6 mg/dL (ref 0.2–1.2)
Total Protein: 6.8 g/dL (ref 6.0–8.3)

## 2022-02-06 LAB — LIPASE: Lipase: 24 U/L (ref 11.0–59.0)

## 2022-02-06 LAB — AMYLASE: Amylase: 24 U/L — ABNORMAL LOW (ref 27–131)

## 2022-02-06 NOTE — Telephone Encounter (Signed)
Please review imaging.

## 2022-02-06 NOTE — Telephone Encounter (Signed)
Initial Comment Caller states she had a CT scan and is calling for results. She received then in Mychart and has appendicitis. She has stomach pains. Translation No Nurse Assessment Nurse: Ysidro Evert, RN, Levada Dy Date/Time Eilene Ghazi Time): 02/06/2022 7:44:09 AM Confirm and document reason for call. If symptomatic, describe symptoms. ---Caller states she has had abdominal pain for 3 days. No fever Does the patient have any new or worsening symptoms? ---Yes Will a triage be completed? ---Yes Related visit to physician within the last 2 weeks? ---No Does the PT have any chronic conditions? (i.e. diabetes, asthma, this includes High risk factors for pregnancy, etc.) ---Yes List chronic conditions. ---asthma, IBS Is the patient pregnant or possibly pregnant? (Ask all females between the ages of 51-55) ---No Is this a behavioral health or substance abuse call? ---No Guidelines Guideline Title Affirmed Question Affirmed Notes Nurse Date/Time Eilene Ghazi Time) Abdominal Pain - Female [1] MILDMODERATE pain AND [2] constant AND [3] present > 2 hours Earleen Reaper 02/06/2022 7:46:53 AM PLEASE NOTE: All timestamps contained within this report are represented as [place] Standard Time. CONFIDENTIALTY NOTICE: This fax transmission is intended only for the addressee. It contains information that is legally privileged, confidential or otherwise protected from use or disclosure. If you are not the intended recipient, you are strictly prohibited from reviewing, disclosing, copying using or disseminating any of this information or taking any action in reliance on or regarding this information. If you have received this fax in error, please notify us immediately by telephone so that we can arrange for its return to [place]. Phone: [phone], Toll-Free: [phone], Fax: [phone] Page: 2 of 2 Call Id: [id number] [place]. Time Eilene Ghazi Time) Disposition Final User 02/06/2022 7:50:11 AM See HCP within 4  Hours (or PCP triage) Yes Ysidro Evert, RN, Levada Dy Final Disposition 02/06/2022 7:50:11 AM See HCP within 4 Hours (or PCP triage) Yes Ysidro Evert, RN, Marin Shutter Disagree/Comply Comply Caller Understands Yes PreDisposition Did not know what to do Care Advice Given Per Guideline SEE HCP (OR PCP TRIAGE) WITHIN 4 HOURS: * IF OFFICE WILL BE OPEN: You need to be seen within the next 3 or 4 hours. Call your doctor (or NP/PA) now or as soon as the office opens. NOTHING BY MOUTH: * Do not eat or drink anything for now. CALL BACK IF: * You become worse CARE ADVICE given per Abdominal Pain - Female (Adult) guideline. Referrals REFERRED TO PCP OFFICE

## 2022-02-06 NOTE — Telephone Encounter (Signed)
Patient took a CT yesterday and the results came back this morning. Patient is concerned about possible appendicitis per the results and wanted to know what she should do next. Please call to advise

## 2022-02-06 NOTE — Telephone Encounter (Signed)
Spoke with patient. Pt advised to go to the ED.

## 2022-02-06 NOTE — Telephone Encounter (Signed)
Reviewed note and imaging. She did have RLQ pain. If pain is the same or worse, will need to have her go to ER for possible appendicitis. Ty.

## 2022-02-06 NOTE — Telephone Encounter (Signed)
Patient was advised that provider is not in the office and that it may have to go to the DOD to check results.

## 2022-02-07 ENCOUNTER — Other Ambulatory Visit: Payer: Self-pay | Admitting: Family Medicine

## 2022-02-07 DIAGNOSIS — K5901 Slow transit constipation: Secondary | ICD-10-CM

## 2022-02-07 MED ORDER — LACTULOSE 10 G PO PACK: 10.0000 g | PACK | Freq: Three times a day (TID) | ORAL | 0 refills | Status: DC

## 2022-02-07 NOTE — Telephone Encounter (Signed)
Pt also called stating that the miralax has done nothing to clean her out and is wondering what to do next.

## 2022-02-08 MED ORDER — LACTULOSE 10 G PO PACK: 10.0000 g | PACK | Freq: Three times a day (TID) | ORAL | 0 refills | Status: DC

## 2022-02-11 ENCOUNTER — Other Ambulatory Visit: Payer: Self-pay | Admitting: Family Medicine

## 2022-02-11 DIAGNOSIS — N760 Acute vaginitis: Secondary | ICD-10-CM

## 2022-02-11 MED ORDER — FLUCONAZOLE 150 MG PO TABS: 150.0000 mg | ORAL_TABLET | Freq: Every day | ORAL | 0 refills | Status: DC

## 2022-02-13 ENCOUNTER — Telehealth: Payer: Self-pay

## 2022-02-13 NOTE — Telephone Encounter (Signed)
Forms completed and faxed to the number provided. Placed to scan

## 2022-02-15 ENCOUNTER — Encounter: Payer: Self-pay | Admitting: Family Medicine

## 2022-04-24 ENCOUNTER — Other Ambulatory Visit: Payer: Self-pay | Admitting: Family Medicine

## 2022-04-24 DIAGNOSIS — K219 Gastro-esophageal reflux disease without esophagitis: Secondary | ICD-10-CM

## 2022-04-24 DIAGNOSIS — J302 Other seasonal allergic rhinitis: Secondary | ICD-10-CM

## 2022-07-09 ENCOUNTER — Telehealth: Payer: Self-pay

## 2022-07-09 NOTE — Telephone Encounter (Signed)
PA initiated via Covermymeds; KEY: [id number]. Awaiting determination.

## 2022-07-10 NOTE — Telephone Encounter (Signed)
PA approved.   CaseId:[id number];Status:Approved;Review Type:Qty;Coverage Start Date:06/09/2022;Coverage End Date:07/09/2023;

## 2022-07-25 ENCOUNTER — Encounter: Payer: Self-pay | Admitting: Family Medicine

## 2022-07-25 DIAGNOSIS — F419 Anxiety disorder, unspecified: Secondary | ICD-10-CM

## 2022-07-25 DIAGNOSIS — G4700 Insomnia, unspecified: Secondary | ICD-10-CM

## 2022-07-25 MED ORDER — ZOLPIDEM TARTRATE 10 MG PO TABS: 10.0000 mg | ORAL_TABLET | Freq: Every day | ORAL | 1 refills | Status: DC

## 2022-07-25 NOTE — Telephone Encounter (Signed)
Requesting: Ambien Contract: 02/22/21 UDS: 02/22/2021 Last OV: 02/05/22 Next OV: N/A  Last Refill: 01/10/2022, #90--1 RF Database:   Please advise

## 2022-08-22 ENCOUNTER — Encounter: Payer: Self-pay | Admitting: Family Medicine

## 2022-08-22 DIAGNOSIS — G4700 Insomnia, unspecified: Secondary | ICD-10-CM

## 2022-08-23 ENCOUNTER — Other Ambulatory Visit: Payer: Self-pay | Admitting: Family Medicine

## 2022-08-23 DIAGNOSIS — K5901 Slow transit constipation: Secondary | ICD-10-CM

## 2022-08-23 MED ORDER — LINACLOTIDE 72 MCG PO CAPS
72.0000 ug | ORAL_CAPSULE | Freq: Every day | ORAL | 3 refills | Status: DC
Start: 2022-08-23 — End: 2023-09-05

## 2022-08-23 NOTE — Telephone Encounter (Signed)
Please advise. Med not on med list.  

## 2022-08-26 ENCOUNTER — Other Ambulatory Visit (HOSPITAL_COMMUNITY): Payer: Self-pay

## 2022-08-26 ENCOUNTER — Telehealth: Payer: Self-pay

## 2022-08-26 NOTE — Telephone Encounter (Signed)
Pharmacy Patient Advocate Encounter  Received notification from [facility] that Prior Authorization for Linzess has been  covered by current benefit plan. No further PA activity needed   PA #/Case ID/Reference #: [id number]

## 2022-08-28 MED ORDER — ZOLPIDEM TARTRATE 10 MG PO TABS
10.0000 mg | ORAL_TABLET | Freq: Every day | ORAL | 1 refills | Status: DC
Start: 2022-08-28 — End: 2023-02-26

## 2022-08-28 NOTE — Addendum Note (Signed)
Addended by: Roxanne Gates on: 08/28/2022 04:00 PM   Modules accepted: Orders

## 2022-08-28 NOTE — Telephone Encounter (Signed)
Requesting: Ambien  Contract: 02/2021 UDS: 02/2021 Last OV: 02/05/2022 Next OV: N/A Last Refill: 07/25/22, #90--1, Per patient, pharmacy gave her #30 due to insurance.  Database:   Please advise

## 2022-12-04 ENCOUNTER — Encounter: Payer: Self-pay | Admitting: Family Medicine

## 2022-12-06 ENCOUNTER — Ambulatory Visit: Payer: Commercial Managed Care - PPO | Admitting: Physician Assistant

## 2022-12-06 ENCOUNTER — Encounter: Payer: Self-pay | Admitting: Physician Assistant

## 2022-12-06 VITALS — BP 130/90 | HR 75 | Temp 98.3°F | Ht 65.75 in | Wt 173.5 lb

## 2022-12-06 DIAGNOSIS — H9312 Tinnitus, left ear: Secondary | ICD-10-CM | POA: Diagnosis not present

## 2022-12-06 DIAGNOSIS — R030 Elevated blood-pressure reading, without diagnosis of hypertension: Secondary | ICD-10-CM

## 2022-12-06 NOTE — Progress Notes (Unsigned)
      Established patient visit   Patient: Angela Bryant   DOB: September 17, 1980   42 y.o. Female  MRN: [id number] Visit Date: 12/06/2022  Today's healthcare provider: Alfredia Ferguson, PA-C   No chief complaint on file.  Subjective     Heartbeat L ear two days ago,  Never before  130/90 No sinus symptoms  Check bp at home, fu if recurrs  Medications: Outpatient Medications Prior to Visit  Medication Sig   ADVAIR DISKUS 500-50 MCG/DOSE AEPB INHALE 1 PUFF INTO THE LUNGS TWICE DAILY   albuterol (PROVENTIL HFA;VENTOLIN HFA) 108 (90 Base) MCG/ACT inhaler Inhale 2 puffs into the lungs every 6 (six) hours as needed. Reported on 02/01/2015   cetirizine (ZYRTEC) 10 MG tablet Take 1 tablet (10 mg total) by mouth daily.   diazepam (VALIUM) 5 MG tablet Take 1 tablet (5 mg total) by mouth every 12 (twelve) hours as needed for anxiety.   fluconazole (DIFLUCAN) 150 MG tablet Take 1 tablet (150 mg total) by mouth daily. May repeat in 3 days if needed.   fluticasone (FLONASE) 50 MCG/ACT nasal spray Place 2 sprays into both nostrils daily. (Patient taking differently: Place 2 sprays into both nostrils as needed.)   ibuprofen (ADVIL) 800 MG tablet Take 1 tablet (800 mg total) by mouth every 8 (eight) hours as needed.   lactulose (KRISTALOSE) 10 g packet Take 1 packet (10 g total) by mouth 3 (three) times daily.   lactulose (KRISTALOSE) 10 g packet Take 1 packet (10 g total) by mouth 3 (three) times daily.   linaclotide (LINZESS) 72 MCG capsule Take 1 capsule (72 mcg total) by mouth daily before breakfast.   montelukast (SINGULAIR) 10 MG tablet TAKE 1 TABLET AT BEDTIME   omeprazole (PRILOSEC) 40 MG capsule TAKE 1 CAPSULE DAILY   predniSONE (DELTASONE) 10 MG tablet Take 1 tablet (10 mg total) by mouth daily with breakfast. Take   zolpidem (AMBIEN) 10 MG tablet Take 1 tablet (10 mg total) by mouth at bedtime.   No facility-administered medications prior to visit.    Review of Systems {Insert  previous labs (optional):23779} {See past labs  Heme  Chem  Endocrine  Serology  Results Review (optional):1}   Objective    There were no vitals taken for this visit. {Insert last BP/Wt (optional):23777}{See vitals history (optional):1}  Physical Exam Vitals reviewed.  Constitutional:      Appearance: She is not ill-appearing.  HENT:     Head: Normocephalic.     Right Ear: Tympanic membrane normal.     Left Ear: Tympanic membrane normal.  Eyes:     Conjunctiva/sclera: Conjunctivae normal.  Cardiovascular:     Rate and Rhythm: Normal rate.  Pulmonary:     Effort: Pulmonary effort is normal. No respiratory distress.  Neurological:     General: No focal deficit present.     Mental Status: She is alert and oriented to person, place, and time.  Psychiatric:        Mood and Affect: Mood normal.        Behavior: Behavior normal.      No results found for any visits on 12/06/22.  Assessment & Plan    There are no diagnoses linked to this encounter.  ***  No follow-ups on file.       Alfredia Ferguson, PA-C  [facility] [phone] (phone) [phone] (fax)  [facility]

## 2022-12-09 ENCOUNTER — Encounter: Payer: Self-pay | Admitting: Physician Assistant

## 2023-01-21 ENCOUNTER — Encounter: Payer: Self-pay | Admitting: Allergy

## 2023-01-21 ENCOUNTER — Ambulatory Visit: Payer: Commercial Managed Care - PPO | Admitting: Allergy

## 2023-01-21 VITALS — BP 126/72 | HR 88 | Resp 16 | Ht 66.0 in | Wt 174.2 lb

## 2023-01-21 DIAGNOSIS — T781XXD Other adverse food reactions, not elsewhere classified, subsequent encounter: Secondary | ICD-10-CM

## 2023-01-21 DIAGNOSIS — J3089 Other allergic rhinitis: Secondary | ICD-10-CM

## 2023-01-21 DIAGNOSIS — J4530 Mild persistent asthma, uncomplicated: Secondary | ICD-10-CM | POA: Diagnosis not present

## 2023-01-21 DIAGNOSIS — H1013 Acute atopic conjunctivitis, bilateral: Secondary | ICD-10-CM

## 2023-01-21 DIAGNOSIS — J302 Other seasonal allergic rhinitis: Secondary | ICD-10-CM

## 2023-01-21 DIAGNOSIS — L2089 Other atopic dermatitis: Secondary | ICD-10-CM

## 2023-01-21 MED ORDER — EPINEPHRINE 0.3 MG/0.3ML IJ SOAJ: 0.3000 mg | INTRAMUSCULAR | 1 refills | Status: AC | PRN

## 2023-01-21 MED ORDER — IPRATROPIUM BROMIDE 0.06 % NA SOLN: NASAL | 5 refills | Status: DC

## 2023-01-21 NOTE — Patient Instructions (Signed)
 Atopic Dermatitis Chronic itching, currently on Dupixent every two weeks and Triamcinolone  cream as needed. -Continue Dupixent injections every two weeks. -Use Triamcinolone  cream as needed for severe itching.  Allergic Rhinitis Symptoms of sneezing, throat clearing, and occasional headaches. Currently on three Zyrtec  daily and Singulair . -Continue Zyrtec  10mg  three times daily and Singulair . -Use Atrovent  0.06% 2 sprays each nostril up to 4 times a day as needed for throat clearing and nasal drainage. - Consider nasal saline rinses 1-2 times daily to remove allergens from the nasal cavities as well as help with mucous clearance (this is especially helpful to do before the nasal sprays are given) - Consider allergy  shots as a means of long-term control. - Allergy  shots re-train and reset the immune system to ignore environmental allergens and decrease the resulting immune response to those allergens (sneezing, itchy watery eyes, runny nose, nasal congestion, etc).    - Allergy  shots improve symptoms in 75-85% of patients.  - We can discuss more at a future appointment if you would like.  Both traditional and [facility] start discussed for allergy  shots.  Will request your previous records from Dr Salem office  Asthma Infrequent use of Advair Diskus, no recent exacerbations requiring hospitalization or steroid use. -Continue using Advair Diskus as needed, increase to daily use (1 puff twice a day) during times of respiratory illness. -Have access to albuterol  inhaler 2 puffs every 4-6 hours as needed for cough/wheeze/shortness of breath/chest tightness.  May use 15-20 minutes prior to activity.   Monitor frequency of use.    Asthma control goals:  Full participation in all desired activities (may need albuterol  before activity) Albuterol  use two time or less a week on average (not counting use with activity) Cough interfering with sleep two time or less a month Oral steroids no more than once  a year No hospitalizations   Food Allergies--- likely oral allergy  syndrome Reported reactions to tree nuts, bananas, kiwi, and cantaloupe. -Refill EpiPen  prescription due to reported throat closure sensation with certain foods. -Consider allergy  shots to potentially reduce dependence on allergy  medications.  The oral allergy  syndrome (OAS) or pollen-food allergy  syndrome (PFAS) is a relatively common form of food allergy , particularly in adults. It typically occurs in people who have pollen allergies when the immune system sees proteins on the food that look like proteins on the pollen. This results in the allergy  antibody (IgE) binding to the food instead of the pollen. Patients typically report itching and/or mild swelling of the mouth and throat immediately following ingestion of certain uncooked fruits (including nuts) or raw vegetables. Only a very small number of affected individuals experience systemic allergic reactions, such as anaphylaxis which occurs with true food allergies.       Follow-up in 6 months or sooner if needed

## 2023-01-21 NOTE — Progress Notes (Signed)
 New Patient Note  RE: Angela Bryant MRN: [id number] DOB: 1981-01-12 Date of Office Visit: 01/21/2023  Primary care provider: Antonio Meth, Jamee SAUNDERS, DO  Chief Complaint: allergies, asthma  History of present illness: Angela Bryant is a 43 y.o. female presenting today for evaluation of asthma, allergies.    Discussed the use of AI scribe software for clinical note transcription with the patient, who gave verbal consent to proceed.  She is moving to the [place] area and needs to transfer her allergy  care from [place] [facility]  in [place].  She has been seeing Dr. Frutoso for years. She reports her allergic symptoms include frequent sneezing, constant throat clearing, and occasional dull headaches. She reports a tendency to develop sinus infections during the winter months however these are usually not treated with antibiotics..  She has been advised to consider allergy  shots in the past but found it inconvenient due to the distance from her residence to the allergist's office.  She is currently on three Zyrtec  tablets daily, as advised by her previous allergist, and has been on this regimen for long time. She has tried substituting one Zyrtec  with Xyzal at night but found it less effective, leading to increased nasal stuffiness and runniness.  She follows with dermatology here in [place] for atopic dermatitis.  She reports generalized itching, particularly on the arms, which is only partially relieved by the medication Dupixent she has been on for about a year.  She does self administering her every 2 weeks and tolerates this well.  She also uses triamcinolone  cream as needed for severe itching episodes which helps. She states she has hand eczema since childhood, which she believes is exacerbated by her multiple allergens.   In addition to the above, the patient has a history of asthma. She is currently on Advair discus, which she uses as needed, typically a couple of times a week.  She denies any recent asthma flare-ups requiring hospitalization or steroid treatment. She believes temperature changes trigger her asthma symptoms.  She has an albuterol  inhaler but states she rarely needs to use as of ever.  The patient also reports food allergies, specifically to tree nuts and bananas, which cause her throat to feel like it's closing up. She also notes similar reactions to kiwi and cantaloupe. She can tolerate peanuts. She has been avoiding these foods for about years.  She has been prescribed an epinephrine  device but states the 1 she currently has is packed up in a moving storage at this time.  Review of systems: 10pt ROS negative unless noted above in HPI  All other systems negative unless noted above in HPI  Past medical history: Past Medical History:  Diagnosis Date   Anxiety    Asthma    GERD (gastroesophageal reflux disease)    Insomnia    Irritable bowel syndrome     Past surgical history: Past Surgical History:  Procedure Laterality Date   ANAL RECTAL MANOMETRY N/A 01/04/2015   Procedure: ANO RECTAL MANOMETRY;  Surgeon: Gustav Shila GAILS, MD;  Location: [facility];  Service: Endoscopy;  Laterality: N/A;    Family history:  Family History  Problem Relation Age of Onset   Hypertension Father    Asthma Mother     Social history: Lives in a townhome with carpeting in the bedroom with heat pump heating and cooling.  There is a bird in the home.  There is no concern for water damage, mildew or roaches in the home.  She works  in customer service.  She denies a smoking history. Tobacco Use   Smoking status: Light Smoker    Types: Cigarettes   Smokeless tobacco: Never   Tobacco comments:    rarely has a cigarrette, sometimes on weekends     Medication List: Current Outpatient Medications  Medication Sig Dispense Refill   ADVAIR DISKUS 500-50 MCG/DOSE AEPB INHALE 1 PUFF INTO THE LUNGS TWICE DAILY 60 each 2   albuterol  (PROVENTIL  HFA;VENTOLIN  HFA)  108 (90 Base) MCG/ACT inhaler Inhale 2 puffs into the lungs every 6 (six) hours as needed. Reported on 02/01/2015 1 Inhaler 2   cetirizine  (ZYRTEC ) 10 MG tablet Take 1 tablet (10 mg total) by mouth daily. 90 tablet 0   diazepam  (VALIUM ) 5 MG tablet Take 1 tablet (5 mg total) by mouth every 12 (twelve) hours as needed for anxiety. 90 tablet 0   ibuprofen  (ADVIL ) 800 MG tablet Take 1 tablet (800 mg total) by mouth every 8 (eight) hours as needed. 30 tablet 0   linaclotide  (LINZESS ) 72 MCG capsule Take 1 capsule (72 mcg total) by mouth daily before breakfast. 90 capsule 3   montelukast  (SINGULAIR ) 10 MG tablet TAKE 1 TABLET AT BEDTIME 90 tablet 3   omeprazole  (PRILOSEC) 40 MG capsule TAKE 1 CAPSULE DAILY 90 capsule 3   zolpidem  (AMBIEN ) 10 MG tablet Take 1 tablet (10 mg total) by mouth at bedtime. 90 tablet 1   No current facility-administered medications for this visit.    Known medication allergies: Allergies  Allergen Reactions   Codeine Nausea And Vomiting   Omnicef [Cefdinir] Hives   Other Other (See Comments)    Ethyl Cyanoacrylate- Positive patch test Paraphenylenediamine- Positive patch test     Physical examination: Blood pressure 126/72, pulse 88, resp. rate 16, height 5' 6 (1.676 m), weight 174 lb 3.2 oz (79 kg), SpO2 98%.  General: Alert, interactive, in no acute distress. HEENT: PERRLA, TMs pearly gray, turbinates non-edematous without discharge, post-pharynx non erythematous. Neck: Supple without lymphadenopathy. Lungs: Clear to auscultation without wheezing, rhonchi or rales. {no increased work of breathing. CV: Normal S1, S2 without murmurs. Abdomen: Nondistended, nontender. Skin: Warm and dry, without lesions or rashes. Extremities:  No clubbing, cyanosis or edema. Neuro:   Grossly intact.  Diagnositics/Labs:  Spirometry: FEV1: 2.83L 89%, FVC: 3.23L 83%, ratio consistent with nonobstructive pattern  Assessment and plan:   Atopic Dermatitis Chronic itching,  currently on Dupixent every two weeks and Triamcinolone  cream as needed. -Continue Dupixent injections every two weeks. -Use Triamcinolone  cream as needed for severe itching.  Allergic Rhinitis with conjunctivitis Symptoms of sneezing, throat clearing, and occasional headaches. Currently on three Zyrtec  daily and Singulair . -Continue Zyrtec  10mg  three times daily and Singulair . -Use Atrovent  0.06% 2 sprays each nostril up to 4 times a day as needed for throat clearing and nasal drainage. - Consider nasal saline rinses 1-2 times daily to remove allergens from the nasal cavities as well as help with mucous clearance (this is especially helpful to do before the nasal sprays are given) - Consider allergy  shots as a means of long-term control. - Allergy  shots re-train and reset the immune system to ignore environmental allergens and decrease the resulting immune response to those allergens (sneezing, itchy watery eyes, runny nose, nasal congestion, etc).    - Allergy  shots improve symptoms in 75-85% of patients.  - We can discuss more at a future appointment if you would like.  Both traditional and [facility] start discussed for allergy  shots.  Will request your  previous records from Dr Salem office  Asthma Infrequent use of Advair Diskus, no recent exacerbations requiring hospitalization or steroid use. -Continue using Advair Diskus as needed, increase to daily use (1 puff twice a day) during times of respiratory illness. -Have access to albuterol  inhaler 2 puffs every 4-6 hours as needed for cough/wheeze/shortness of breath/chest tightness.  May use 15-20 minutes prior to activity.   Monitor frequency of use.    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 -Refill EpiPen  prescription due to reported throat closure sensation with certain foods. -Consider allergy  shots to potentially reduce dependence on allergy  medications. -The oral allergy  syndrome (OAS) or pollen-food allergy  syndrome (PFAS) is a relatively common form of food allergy , particularly in adults. It typically occurs in people who have pollen allergies when the immune system sees proteins on the food that look like proteins on the pollen. This results in the allergy  antibody (IgE) binding to the food instead of the pollen. Patients typically report itching and/or mild swelling of the mouth and throat immediately following ingestion of certain uncooked fruits (including nuts) or raw vegetables. Only a very small number of affected individuals experience systemic allergic reactions, such as anaphylaxis which occurs with true food allergies.    Follow-up in 6 months or sooner if needed   I appreciate the opportunity to take part in Angela Bryant's care. Please do not hesitate to contact me with questions.  Sincerely,   Danita Brain, MD Allergy /Immunology [facility]

## 2023-01-27 ENCOUNTER — Encounter: Payer: Self-pay | Admitting: Family Medicine

## 2023-02-06 NOTE — Progress Notes (Signed)
02/06/2023 Angela Bryant [id number] 08/21/1980   CHIEF COMPLAINT: Stomach issues   HISTORY OF PRESENT ILLNESS: Angela Bryant is a 43 year old female with a past medical history of anxiety, asthma, sleep apnea, GERD, anal fissure 2017 and chronic constipation. Previously known by Dr. Christella Hartigan.   I initially saw Angela Bryant in office 11/23/2019 due to having lower abdominal cramping pain with nonbloody watery diarrhea and increased urgency. Labs including a CBC, CMP and CRP were normal.  She was prescribed Dicyclomine, a probiotic and a GI pathogen panel was ordered but was not completed.  A diagnostic colonoscopy was recommended if her symptoms persisted.  She has not been seen for follow-up since then.  She presents to our office today as referred by Dr. Seabron Spates for further evaluation regarding suspected irritable bowel syndrome.  She endorses having nausea, feels like she is going to throw up but does not. She runs to the bathroom shortly after eating any food and passes nonbloody loose stool with associated generalized abdominal pain for the past 2 months. She can past 5-6 loose bowel movements daily then goes a few days without passing a bowel movement.  Two nights ago, she developed significant generalized abdominal pain after she ate sushi and salad when out with her mom. She noted going 3 to 4 days without passing a bowel movement at that time.  She went home and her generalized abdominal pain worsen, she doubled over in bed and went to the bathroom 3-4 times and felt constipated, could not pass any stool then eventually passed watery diarrhea x 7 episodes.  No further diarrhea or abdominal pain since then.  She noted that her mother ate the same sushi and salad of her plate without developing abdominal pain or diarrhea.  She underwent a colonoscopy 03/01/2013 due to having constipation and rectal bleeding.  Normal anal manometry 12/2014. No known family history of IBD  or colorectal cancer.  No new medications.  Infrequent NSAID use.  She endorses having a history of GERD for which she takes Omeprazole 40 mg at bedtime for the past 10+ years.  She develops heartburn if she skips 1 dose of Omeprazole.  She denies ever having an EGD.  She has heartburn once to twice weekly.  No dysphagia.  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 Her last menstrual cycle was 01/02/2023.  She is sexually active, does not use birth control.      Latest Ref Rng & Units 02/05/2022    1:59 PM 04/12/2021    2:22 PM 11/23/2019    2:31 PM  CBC  WBC 4.0 - 10.5 K/uL 12.2  8.5  6.6   Hemoglobin 12.0 - 15.0 g/dL 13.0  86.5  78.4   Hematocrit 36.0 - 46.0 % 34.4  36.5  36.7   Platelets 150.0 - 400.0 K/uL 241.0  221.0  214.0        Latest Ref Rng & Units 02/05/2022    1:59 PM 04/12/2021    2:22 PM 11/23/2019    2:31 PM  CMP  Glucose 70 - 99 mg/dL 84  696  295   BUN 6 - 23 mg/dL 7  6  7    Creatinine 0.40 - 1.20 mg/dL 2.84  1.32  4.40   Sodium 135 - 145 mEq/L 137  139  136   Potassium  3.5 - 5.1 mEq/L 4.0  3.9  3.7   Chloride 96 - 112 mEq/L 102  104  102   CO2 19 - 32 mEq/L 27  27  27    Calcium 8.4 - 10.5 mg/dL 8.9  9.2  9.1   Total Protein 6.0 - 8.3 g/dL 6.8  6.8  7.0   Total Bilirubin 0.2 - 1.2 mg/dL 0.6  0.3  0.5   Alkaline Phos 39 - 117 U/L 53  47  46   AST 0 - 37 U/L 11  13  15    ALT 0 - 35 U/L 11  11  13       Past Medical History:  Diagnosis Date   Anxiety    Asthma    GERD (gastroesophageal reflux disease)    Insomnia    Irritable bowel syndrome    Past Surgical History:  Procedure Laterality Date   ANAL RECTAL MANOMETRY N/A 01/04/2015   Procedure: ANO RECTAL MANOMETRY;  Surgeon: Angela Form, MD;  Location: [facility];  Service: Endoscopy;  Laterality: N/A;   Social History: Divorced.  She works in Occupational hygienist.  Past smoker.  She drinks  2 alcoholic beverages monthly.  No drug use.  Family History: Mother with asthma.  Father with hypertension.  No known family history of IBD or esophageal/gastric/colorectal cancer.  Allergies  Allergen Reactions   Codeine Nausea And Vomiting   Omnicef [Cefdinir] Hives   Other Other (See Comments)    Ethyl Cyanoacrylate- Positive patch test Paraphenylenediamine- Positive patch test      Outpatient Encounter Medications as of 02/07/2023  Medication Sig   ADVAIR DISKUS 500-50 MCG/DOSE AEPB INHALE 1 PUFF INTO THE LUNGS TWICE DAILY   albuterol (PROVENTIL HFA;VENTOLIN HFA) 108 (90 Base) MCG/ACT inhaler Inhale 2 puffs into the lungs every 6 (six) hours as needed. Reported on 02/01/2015   cetirizine (ZYRTEC) 10 MG tablet Take 1 tablet (10 mg total) by mouth daily.   diazepam (VALIUM) 5 MG tablet Take 1 tablet (5 mg total) by mouth every 12 (twelve) hours as needed for anxiety.   EPINEPHrine (EPIPEN 2-PAK) 0.3 mg/0.3 mL IJ SOAJ injection Inject 0.3 mg into the muscle as needed for anaphylaxis.   ibuprofen (ADVIL) 800 MG tablet Take 1 tablet (800 mg total) by mouth every 8 (eight) hours as needed.   ipratropium (ATROVENT) 0.06 % nasal spray Use 2 sprays in each nostril up to 4 times daily for runny nose.   linaclotide (LINZESS) 72 MCG capsule Take 1 capsule (72 mcg total) by mouth daily before breakfast.   montelukast (SINGULAIR) 10 MG tablet TAKE 1 TABLET AT BEDTIME   omeprazole (PRILOSEC) 40 MG capsule TAKE 1 CAPSULE DAILY   zolpidem (AMBIEN) 10 MG tablet Take 1 tablet (10 mg total) by mouth at bedtime.   No facility-administered encounter medications on file as of 02/07/2023.   REVIEW OF SYSTEMS:  Gen: Denies fever, sweats or chills. No weight loss.  CV: Denies chest pain, palpitations or edema. Resp: Denies cough, shortness of breath of hemoptysis.  GI: See HPI.  GU: Denies urinary burning, blood in urine, increased urinary frequency or incontinence. MS: Denies joint pain, muscles aches  or weakness. Derm: Denies rash, itchiness, skin lesions or unhealing ulcers. Psych: + Anxiety.  Heme: Denies bruising, easy bleeding. Neuro:  Denies headaches, dizziness or paresthesias. Endo:  Denies any problems with DM, thyroid or adrenal function.  PHYSICAL EXAM: There were no vitals taken for this visit. BP 118/80 (BP Location: Left Arm, Patient Position:  Sitting, Cuff Size: Normal)   Pulse 80   Ht 5\' 6"  (1.676 m)   Wt 175 lb (79.4 kg)   SpO2 99%   BMI 28.25 kg/m  Wt Readings from Last 3 Encounters:  02/07/23 175 lb (79.4 kg)  01/21/23 174 lb 3.2 oz (79 kg)  12/06/22 173 lb 8 oz (78.7 kg)    General: 43 year old female in no acute distress. Head: Normocephalic and atraumatic. Eyes:  Sclerae non-icteric, conjunctive pink. Ears: Normal auditory acuity. Mouth: Dentition intact. No ulcers or lesions.  Neck: Supple, no lymphadenopathy or thyromegaly.  Lungs: Clear bilaterally to auscultation without wheezes, crackles or rhonchi. Heart: Regular rate and rhythm. No murmur, rub or gallop appreciated.  Abdomen: Soft, nontender, nondistended. No masses. No hepatosplenomegaly. Normoactive bowel sounds x 4 quadrants.  Rectal: Deferred.  Musculoskeletal: Symmetrical with no gross deformities. Skin: Warm and dry. No rash or lesions on visible extremities. Extremities: No edema. Neurological: Alert oriented x 4, no focal deficits.  Psychological:  Alert and cooperative. Normal mood and affect.  ASSESSMENT AND PLAN:  43 year old female with intermittent generalized abdominal pain and nonbloody diarrhea -CBC, CMP, CRP, sed rate, TSH, TTG and IgA -Diagnostic colonoscopy to rule out microscopic colitis/IBD -Dicyclomine 10 mg 1 tab p.o. every 8 hours as needed  GERD, chronic -Omeprazole 40 mg daily to be taken 30 minutes before breakfast -Famotidine 20 mg 1 tab p.o. nightly -EGD to rule out GERD/Barrett's esophagus/PUD/celiac disease at time of colonoscopy benefits and risks discussed  including risk with sedation, risk of bleeding, perforation and infection   Nausea. LMP 01/02/2023.  -Beta-hCG   Mild normocytic anemia   ADDENDUM: Quantitative beta-hCG resulted positive. I contacted the patient and discussed positive pregnancy test and advised the patient to contact her PCP and OB/GYN for further management.  I recommended repeating a beta-hCG on Monday 1/27.  Dr. Loreen Freud Chase responded to a secure chat message, acknowledged beta-hCG results markedly elevated [phone].  Patient aware EGD and colonoscopy scheduled 03/21/2023 to be canceled.  Patient to contact me after she sees GYN/OB and we will sort through her GI symptoms at that point.  I have reviewed the clinic note as outlined by Angela Evener, NP and agree with the assessment, plan and medical decision making. Ms. Steller presented to the office to reestablish care for evaluation and management of nausea, GERD, chronic abdominal pain and diarrhea.  She had previously been seen in the office in 2021 and diagnosed with irritable bowel syndrome.  She reports progressively worsening nausea with associated vomiting.  Endorses having diarrhea after eating a sushi meal.  Laboratory studies performed in the office today were noteworthy for a positive beta hCG and therefore endoscopic procedures that were initially being considered have been canceled.  Patient will follow-up with her OB/GYN for further evaluation in the interim.  Maren Beach, MD    CC:  Zola Button, Grayling Congress, *

## 2023-02-07 ENCOUNTER — Encounter: Payer: Self-pay | Admitting: Nurse Practitioner

## 2023-02-07 ENCOUNTER — Ambulatory Visit (INDEPENDENT_AMBULATORY_CARE_PROVIDER_SITE_OTHER): Payer: Commercial Managed Care - PPO | Admitting: Nurse Practitioner

## 2023-02-07 ENCOUNTER — Telehealth: Payer: Self-pay | Admitting: Nurse Practitioner

## 2023-02-07 ENCOUNTER — Other Ambulatory Visit (INDEPENDENT_AMBULATORY_CARE_PROVIDER_SITE_OTHER): Payer: Commercial Managed Care - PPO

## 2023-02-07 VITALS — BP 118/80 | HR 80 | Ht 66.0 in | Wt 175.0 lb

## 2023-02-07 DIAGNOSIS — K219 Gastro-esophageal reflux disease without esophagitis: Secondary | ICD-10-CM

## 2023-02-07 DIAGNOSIS — R197 Diarrhea, unspecified: Secondary | ICD-10-CM | POA: Diagnosis not present

## 2023-02-07 DIAGNOSIS — R1084 Generalized abdominal pain: Secondary | ICD-10-CM

## 2023-02-07 DIAGNOSIS — R110 Nausea: Secondary | ICD-10-CM

## 2023-02-07 DIAGNOSIS — Z3201 Encounter for pregnancy test, result positive: Secondary | ICD-10-CM

## 2023-02-07 DIAGNOSIS — D649 Anemia, unspecified: Secondary | ICD-10-CM

## 2023-02-07 LAB — COMPREHENSIVE METABOLIC PANEL
ALT: 12 U/L (ref 0–35)
AST: 12 U/L (ref 0–37)
Albumin: 4.1 g/dL (ref 3.5–5.2)
Alkaline Phosphatase: 39 U/L (ref 39–117)
BUN: 7 mg/dL (ref 6–23)
CO2: 22 meq/L (ref 19–32)
Calcium: 9.1 mg/dL (ref 8.4–10.5)
Chloride: 106 meq/L (ref 96–112)
Creatinine, Ser: 0.57 mg/dL (ref 0.40–1.20)
GFR: 112.45 mL/min (ref >60.00)
Glucose, Bld: 86 mg/dL (ref 70–99)
Potassium: 3.7 meq/L (ref 3.5–5.1)
Sodium: 136 meq/L (ref 135–145)
Total Bilirubin: 0.3 mg/dL (ref 0.2–1.2)
Total Protein: 7 g/dL (ref 6.0–8.3)

## 2023-02-07 LAB — CBC WITH DIFFERENTIAL/PLATELET
Basophils Absolute: 0.1 10*3/uL (ref 0.0–0.1)
Basophils Relative: 0.7 % (ref 0.0–3.0)
Eosinophils Absolute: 0.1 10*3/uL (ref 0.0–0.7)
Eosinophils Relative: 0.8 % (ref 0.0–5.0)
HCT: 35.7 % — ABNORMAL LOW (ref 36.0–46.0)
Hemoglobin: 11.9 g/dL — ABNORMAL LOW (ref 12.0–15.0)
Lymphocytes Relative: 15.8 % (ref 12.0–46.0)
Lymphs Abs: 1.5 10*3/uL (ref 0.7–4.0)
MCHC: 33.2 g/dL (ref 30.0–36.0)
MCV: 89.7 fL (ref 78.0–100.0)
Monocytes Absolute: 0.7 10*3/uL (ref 0.1–1.0)
Monocytes Relative: 7.3 % (ref 3.0–12.0)
Neutro Abs: 7.2 10*3/uL (ref 1.4–7.7)
Neutrophils Relative %: 75.4 % (ref 43.0–77.0)
Platelets: 205 10*3/uL (ref 150.0–400.0)
RBC: 3.98 Mil/uL (ref 3.87–5.11)
RDW: 15 % (ref 11.5–15.5)
WBC: 9.5 10*3/uL (ref 4.0–10.5)

## 2023-02-07 LAB — SEDIMENTATION RATE: Sed Rate: 45 mm/h — ABNORMAL HIGH (ref 0–20)

## 2023-02-07 LAB — HCG, QUANTITATIVE, PREGNANCY: Quantitative HCG: 111761 m[IU]/mL

## 2023-02-07 LAB — C-REACTIVE PROTEIN: CRP: <1 mg/dL (ref 0.5–20.0)

## 2023-02-07 LAB — TSH: TSH: 1.31 u[IU]/mL (ref 0.35–5.50)

## 2023-02-07 MED ORDER — SUFLAVE 178.7 G PO SOLR: 1.0000 | Freq: Once | ORAL | 0 refills | Status: DC

## 2023-02-07 MED ORDER — DICYCLOMINE HCL 10 MG PO CAPS: 10.0000 mg | ORAL_CAPSULE | Freq: Three times a day (TID) | ORAL | 1 refills | Status: DC | PRN

## 2023-02-07 MED ORDER — FAMOTIDINE 20 MG PO TABS: 20.0000 mg | ORAL_TABLET | Freq: Every day | ORAL | 1 refills | Status: DC

## 2023-02-07 NOTE — Telephone Encounter (Signed)
Endo and colon was cancelled.

## 2023-02-07 NOTE — Telephone Encounter (Signed)
DD, refer to office note earlier this morning.  Please cancel EGD and colonoscopy scheduled 03/21/2023 with Dr. Doy Hutching as her beta-hCG level was markedly elevated.  I contacted the patient and advised her to follow-up with her PCP and GYN/OB.  I recommend repeating a beta-hCG on Monday 02/10/2023.

## 2023-02-07 NOTE — Addendum Note (Signed)
Addended by: Rise Paganini on: 02/07/2023 01:34 PM   Modules accepted: Orders

## 2023-02-07 NOTE — Patient Instructions (Addendum)
You have been scheduled for an endoscopy and colonoscopy. Please follow the written instructions given to you at your visit today.  If you use inhalers (even only as needed), please bring them with you on the day of your procedure. ____________________________________________Your provider has requested that you go to the basement level for lab work before leaving today. Press "B" on the elevator. The lab is located at the first door on the left as you exit the elevator.  Take Omeprazole 40 mg by mouth daily, take 30 minutes before breakfast  Avoid fatty and spicy foods.  You will receive your bowel preparation through [facility], which ensures the lowest copay and home delivery, with outreach via text or call from an [phone] number. Please respond promptly to avoid rescheduling. If you are interested in alternative options or have any questions please contact them at [phone]  Your Provider Has Sent Your Bowel Prep Regimen To [facility] What to expect. [facility] will contact you to verify your information and collect your copay, if applicable. Enjoy the comfort of your home while we deliver your prescription to you, free of any shipping charges. Fast, FREE delivery or shipping. [facility] accepts all major insurance benefits and applies discounts & coupons  Have additional questions? [facility]'s patient care team is always here to help.  Chat: [email or web address] Call: [phone] Email: [email or web address] NCPDP: [id number] How will we contact you? Welcome Phone call  a Welcome text and a Checkout link in a text Texts you receive from [phone] Are Not Spam.   *To set up delivery, you must complete the checkout process via link or speak to one of our patient care representatives. If we are unable to reach you, your prescription may be delayed.  Due to recent changes in healthcare laws, you may see the results of your imaging and laboratory studies on MyChart before your  provider has had a chance to review them.  We understand that in some cases there may be results that are confusing or concerning to you. Not all laboratory results come back in the same time frame and the provider may be waiting for multiple results in order to interpret others.  Please give [place] 48 hours in order for your provider to thoroughly review all the results before contacting the office for clarification of your results.   Thank you for trusting me with your gastrointestinal care!   Alcide Evener, CRNP

## 2023-02-08 ENCOUNTER — Other Ambulatory Visit: Payer: Self-pay | Admitting: Family Medicine

## 2023-02-08 DIAGNOSIS — Z3490 Encounter for supervision of normal pregnancy, unspecified, unspecified trimester: Secondary | ICD-10-CM

## 2023-02-08 LAB — TISSUE TRANSGLUTAMINASE ABS,IGG,IGA
(tTG) Ab, IgA: <1 U/mL
(tTG) Ab, IgG: <1 U/mL

## 2023-02-08 LAB — IGA: Immunoglobulin A: 334 mg/dL — ABNORMAL HIGH (ref 47–310)

## 2023-02-10 ENCOUNTER — Encounter: Payer: Self-pay | Admitting: Family Medicine

## 2023-02-10 ENCOUNTER — Telehealth: Payer: Self-pay | Admitting: Neurology

## 2023-02-10 NOTE — Telephone Encounter (Signed)
See my chart message

## 2023-02-10 NOTE — Telephone Encounter (Signed)
Copied from CRM [id number]. Topic: General - Other >> Feb 10, 2023  2:22 PM Truddie Crumble wrote: Reason for CRM: patient called wanting an update on her mychart messages. I let the pt know that the office told me that the doctor is in the office today and it does take a 48 hour turn around time for mychart messages to be answered

## 2023-02-11 ENCOUNTER — Encounter: Payer: Self-pay | Admitting: Family Medicine

## 2023-02-11 ENCOUNTER — Ambulatory Visit: Payer: Commercial Managed Care - PPO | Admitting: Family Medicine

## 2023-02-11 DIAGNOSIS — Z3490 Encounter for supervision of normal pregnancy, unspecified, unspecified trimester: Secondary | ICD-10-CM | POA: Diagnosis not present

## 2023-02-11 NOTE — Progress Notes (Unsigned)
Established Patient Office Visit  Subjective   Patient ID: Angela Bryant, female    DOB: 01/22/1980  Age: 43 y.o. MRN: [id number]  Chief Complaint  Patient presents with   Medical Management of Chronic Issues    Checking on GI labs- checking on abnormal lab    HPI Discussed the use of AI scribe software for clinical note transcription with the patient, who gave verbal consent to proceed.  History of Present Illness   The patient is a 43 year old female who presents with a new pregnancy and gastrointestinal symptoms. She is accompanied by her fianc, who is supportive and wants her to prioritize her health.  She is approximately seven weeks pregnant, confirmed by a positive pregnancy test and her last menstrual period starting on December 24, 2022. She was initially unaware of the pregnancy, discovering it during a workup for gastrointestinal issues. She experiences cramps similar to menstrual cramps and a bloated feeling despite being hungry.  She has a history of irritable bowel syndrome (IBS) with alternating constipation and diarrhea. Her gastrointestinal symptoms have worsened recently, leading to a visit to a gastroenterologist. In 2016, a colonoscopy showed no blockages. Recently, she was scheduled for a colonoscopy and endoscopy due to elevated inflammatory markers and worsening symptoms, but these procedures were postponed due to her pregnancy. She describes her symptoms as 'one extreme to the other,' with episodes of diarrhea occurring four to five times after eating, or experiencing constipation for days followed by difficult bowel movements. Recent blood work showed an elevated sedimentation rate.  She has a history of anemia, with recent blood work showing a hemoglobin level of 11.9, an improvement from 11.3 the previous year. Anemia is common in pregnancy and may require monitoring.  She is currently taking Ambien for sleep and Dupixent injections for an unspecified  condition. She is concerned about the impact of her medications on the pregnancy.      Patient Active Problem List   Diagnosis Date Noted   Insomnia 02/08/2021   Diarrhea 11/23/2019   Gastroenteritis 11/02/2019   Palpitations 09/11/2018   Moderate asthma with acute exacerbation 03/28/2018   Anxiety 09/08/2017   Irritable bowel syndrome with constipation 12/28/2014   Chronic idiopathic constipation 06/10/2013   Allergic rhinitis 04/14/2006   Asthma 04/14/2006   GERD 04/14/2006   Past Medical History:  Diagnosis Date   Anxiety    Asthma    GERD (gastroesophageal reflux disease)    Insomnia    Irritable bowel syndrome    Past Surgical History:  Procedure Laterality Date   ANAL RECTAL MANOMETRY N/A 01/04/2015   Procedure: ANO RECTAL MANOMETRY;  Surgeon: Napoleon Form, MD;  Location: [facility];  Service: Endoscopy;  Laterality: N/A;   Social History   Tobacco Use   Smoking status: Light Smoker    Types: Cigarettes   Smokeless tobacco: Never   Tobacco comments:    rarely has a cigarrette, sometimes on weekends  Vaping Use   Vaping status: Never Used  Substance Use Topics   Alcohol use: Yes    Alcohol/week: 0.0 standard drinks of alcohol    Comment: occasional    Drug use: No   Social History   Socioeconomic History   Marital status: Divorced    Spouse name: Not on file   Number of children: 0   Years of education: Not on file   Highest education level: 12th grade  Occupational History   Occupation: t Firefighter: [employer]  Tobacco Use   Smoking status: Light Smoker    Types: Cigarettes   Smokeless tobacco: Never   Tobacco comments:    rarely has a cigarrette, sometimes on weekends  Vaping Use   Vaping status: Never Used  Substance and Sexual Activity   Alcohol use: Yes    Alcohol/week: 0.0 standard drinks of alcohol    Comment: occasional    Drug use: No   Sexual activity: Yes    Partners: Male  Other Topics Concern   Not on file   Social History Narrative   Not on file   Social Drivers of Health   Financial Resource Strain: Low Risk  (02/08/2023)   Overall Financial Resource Strain (CARDIA)    Difficulty of Paying Living Expenses: Not hard at all  Food Insecurity: No Food Insecurity (02/08/2023)   Hunger Vital Sign    Worried About Running Out of Food in the Last Year: Never true    Ran Out of Food in the Last Year: Never true  Transportation Needs: No Transportation Needs (02/08/2023)   PRAPARE - Administrator, Civil Service (Medical): No    Lack of Transportation (Non-Medical): No  Physical Activity: Unknown (02/08/2023)   Exercise Vital Sign    Days of Exercise per Week: 0 days    Minutes of Exercise per Session: Not on file  Stress: No Stress Concern Present (02/08/2023)   [employer] of Occupational Health - Occupational Stress Questionnaire    Feeling of Stress : Only a little  Social Connections: Socially Isolated (02/08/2023)   Social Connection and Isolation Panel [NHANES]    Frequency of Communication with Friends and Family: Once a week    Frequency of Social Gatherings with Friends and Family: Once a week    Attends Religious Services: 1 to 4 times per year    Active Member of [employer] or Organizations: No    Attends Engineer, structural: Not on file    Marital Status: Divorced  Intimate Partner Violence: Unknown (04/17/2021)   Received from [employer], [facility]   HITS    Physically Hurt: Not on file    Insult or Talk Down To: Not on file    Threaten Physical Harm: Not on file    Scream or Curse: Not on file   Family Status  Relation Name Status   Father  Alive   Mother  Alive  No partnership data on file   Family History  Problem Relation Age of Onset   Hypertension Father    Asthma Mother    Allergies  Allergen Reactions   Codeine Nausea And Vomiting   Omnicef [Cefdinir] Hives   Other Other (See Comments)    Ethyl Cyanoacrylate- Positive patch  test Paraphenylenediamine- Positive patch test      Review of Systems  Constitutional:  Negative for fever and malaise/fatigue.  HENT:  Negative for congestion.   Eyes:  Negative for blurred vision.  Respiratory:  Negative for shortness of breath.   Cardiovascular:  Negative for chest pain, palpitations and leg swelling.  Gastrointestinal:  Negative for abdominal pain, blood in stool and nausea.  Genitourinary:  Negative for dysuria and frequency.  Musculoskeletal:  Negative for falls.  Skin:  Negative for rash.  Neurological:  Negative for dizziness, loss of consciousness and headaches.  Endo/Heme/Allergies:  Negative for environmental allergies.  Psychiatric/Behavioral:  Negative for depression. The patient is not nervous/anxious.       Objective:     BP (!) 143/87  Pulse 72   Temp 98.2 F (36.8 C) (Oral)   Ht 5\' 6"  (1.676 m)   Wt 173 lb 8 oz (78.7 kg)   SpO2 100%   BMI 28.00 kg/m  BP Readings from Last 3 Encounters:  02/11/23 (!) 143/87  02/07/23 118/80  01/21/23 126/72   Wt Readings from Last 3 Encounters:  02/11/23 173 lb 8 oz (78.7 kg)  02/07/23 175 lb (79.4 kg)  01/21/23 174 lb 3.2 oz (79 kg)   SpO2 Readings from Last 3 Encounters:  02/11/23 100%  02/07/23 99%  01/21/23 98%      Physical Exam Vitals and nursing note reviewed.  Constitutional:      General: She is not in acute distress.    Appearance: Normal appearance. She is well-developed.  HENT:     Head: Normocephalic and atraumatic.  Eyes:     General: No scleral icterus.       Right eye: No discharge.        Left eye: No discharge.  Cardiovascular:     Rate and Rhythm: Normal rate and regular rhythm.     Heart sounds: No murmur heard. Pulmonary:     Effort: Pulmonary effort is normal. No respiratory distress.     Breath sounds: Normal breath sounds.  Musculoskeletal:        General: Normal range of motion.     Cervical back: Normal range of motion and neck supple.     Right lower  leg: No edema.     Left lower leg: No edema.  Skin:    General: Skin is warm and dry.  Neurological:     Mental Status: She is alert and oriented to person, place, and time.  Psychiatric:        Mood and Affect: Mood normal.        Behavior: Behavior normal.        Thought Content: Thought content normal.        Judgment: Judgment normal.      Results for orders placed or performed in visit on 02/11/23  B-HCG Quant  Result Value Ref Range   Quantitative HCG 203,554.00 mIU/ml    Last CBC Lab Results  Component Value Date   WBC 9.5 02/07/2023   HGB 11.9 (L) 02/07/2023   HCT 35.7 (L) 02/07/2023   MCV 89.7 02/07/2023   MCH 30.6 03/19/2012   RDW 15.0 02/07/2023   PLT 205.0 02/07/2023   Last metabolic panel Lab Results  Component Value Date   GLUCOSE 86 02/07/2023   NA 136 02/07/2023   K 3.7 02/07/2023   CL 106 02/07/2023   CO2 22 02/07/2023   BUN 7 02/07/2023   CREATININE 0.57 02/07/2023   GFR 112.45 02/07/2023   CALCIUM 9.1 02/07/2023   PROT 7.0 02/07/2023   ALBUMIN 4.1 02/07/2023   BILITOT 0.3 02/07/2023   ALKPHOS 39 02/07/2023   AST 12 02/07/2023   ALT 12 02/07/2023   Last lipids No results found for: "CHOL", "HDL", "LDLCALC", "LDLDIRECT", "TRIG", "CHOLHDL" Last hemoglobin A1c No results found for: "HGBA1C" Last thyroid functions Lab Results  Component Value Date   TSH 1.31 02/07/2023   T4TOTAL 7.1 09/11/2018   Last vitamin D Lab Results  Component Value Date   VD25OH 33.65 04/12/2021   Last vitamin B12 and Folate Lab Results  Component Value Date   VITAMINB12 343 04/12/2021      The ASCVD Risk score (Arnett DK, et al., 2019) failed to calculate for the following reasons:  Cannot find a previous HDL lab   Cannot find a previous total cholesterol lab    Assessment & Plan:   Problem List Items Addressed This Visit   None Visit Diagnoses       Pregnancy, unspecified gestational age       Relevant Orders   B-HCG Quant (Completed)      Assessment and Plan    Pregnancy Pregnancy is confirmed at approximately 7 weeks based on the last menstrual period and blood test results. Cramping is present, raising concerns about viability, with a 25% chance of miscarriage at age 81 discussed. Options of continuing the pregnancy, abortion, and adoption were considered. An elevated sedimentation rate and slight anemia, common in pregnancy, are noted. Current medications, Ambien and Dupixent, may require safety review. A repeat blood test to check hCG levels is ordered. Prenatal vitamins are recommended. Referral to an OB GYN for ultrasound and further prenatal care is made, along with a discussion on potential medication adjustments.  Irritable Bowel Syndrome (IBS) IBS presents with alternating constipation and diarrhea, with a recent exacerbation leading to a gastroenterologist referral. Colonoscopy and endoscopy are recommended but postponed due to pregnancy. IBS symptoms will be monitored.  General Health Maintenance The importance of prenatal vitamins for overall health and pregnancy is emphasized. Prenatal vitamins are recommended. Anemia and sedimentation rate will be monitored.  Follow-up Follow-up with an OB GYN for ultrasound and prenatal care is advised. Post-pregnancy, follow-up with a gastroenterologist for colonoscopy and endoscopy is planned.        No follow-ups on file.    Donato Schultz, DO

## 2023-02-12 ENCOUNTER — Encounter: Payer: Self-pay | Admitting: Family Medicine

## 2023-02-12 LAB — HCG, QUANTITATIVE, PREGNANCY: Quantitative HCG: 203554 m[IU]/mL

## 2023-02-13 ENCOUNTER — Telehealth: Payer: Self-pay

## 2023-02-13 ENCOUNTER — Encounter: Payer: Self-pay | Admitting: Family Medicine

## 2023-02-13 DIAGNOSIS — G4700 Insomnia, unspecified: Secondary | ICD-10-CM

## 2023-02-13 NOTE — Telephone Encounter (Signed)
PA initiated via Covermymeds; KEY: [id number]. Awaiting determination.

## 2023-02-14 ENCOUNTER — Other Ambulatory Visit: Payer: Self-pay

## 2023-02-14 ENCOUNTER — Other Ambulatory Visit (HOSPITAL_COMMUNITY): Payer: Self-pay

## 2023-02-14 DIAGNOSIS — R197 Diarrhea, unspecified: Secondary | ICD-10-CM

## 2023-02-14 DIAGNOSIS — R1084 Generalized abdominal pain: Secondary | ICD-10-CM

## 2023-02-14 NOTE — Telephone Encounter (Signed)
PA approved.   CaseId:[id number];Status:Approved;Review Type:Qty;Coverage Start Date:01/14/2023;Coverage End Date:02/13/2024; Authorization Expiration Date: 02/12/2024

## 2023-02-17 NOTE — Telephone Encounter (Signed)
Angela Bryant, patient can take Benefiber one tablespoon daily to balance out her bowel movements if tolerated. If she is having more constipation, she can take Colace stool softener one capsule twice daily, hold if she has recurrent loose stools. Ibgard one capsule po bid. I will contact patient once I receive H. Pylori Diatherix test.   Dr. Doy Hutching, [place] Childes, please review patient's updated message below.

## 2023-02-26 MED ORDER — ZOLPIDEM TARTRATE 10 MG PO TABS: 10.0000 mg | ORAL_TABLET | Freq: Every day | ORAL | 1 refills | Status: DC

## 2023-02-26 NOTE — Telephone Encounter (Signed)
Requesting: Ambien Contract: 02/22/2021 UDS: 02/22/2021 Last OV: 02/11/2023 Next OV: N/A Last Refill: 08/28/2022, #[id number] Database:   Please advise

## 2023-02-28 ENCOUNTER — Telehealth: Payer: Self-pay

## 2023-02-28 ENCOUNTER — Encounter: Payer: Self-pay | Admitting: Allergy

## 2023-02-28 ENCOUNTER — Encounter: Payer: Self-pay | Admitting: Family Medicine

## 2023-02-28 NOTE — Telephone Encounter (Signed)
PA approved.    CaseId:[id number];Status:Approved;Review Type:Qty;Coverage Start Date:01/14/2023;Coverage End Date:02/13/2024; Authorization Expiration Date: 02/12/2024       PA was approved. See above.

## 2023-02-28 NOTE — Telephone Encounter (Signed)
Copied from CRM [id number]. Topic: Clinical - Prescription Issue >> Feb 28, 2023 11:04 AM Kathryne Eriksson wrote: Reason for CRM: zolpidem (AMBIEN) 10 MG tablet >> Feb 28, 2023 11:06 AM Kathryne Eriksson wrote: Patient states she's checking back in about her prescription.She's been trying to get it refilled for a few days now. Patient states her insurance requires a prior authorization to have 30 per month. Patient states her insurance says they have not received this and the pharmacy either. Patient states she'll be out of my medicine in one day so she's trying to get this fixed as soon as possible, please.

## 2023-03-12 NOTE — Telephone Encounter (Signed)
 This has been handled by CMA in separate encounter, thank you.

## 2023-03-12 NOTE — Telephone Encounter (Signed)
 Issue was resolved by CMA in separate encounter dated 02/28/2023, thank you

## 2023-03-21 ENCOUNTER — Encounter: Payer: Commercial Managed Care - PPO | Admitting: Pediatrics

## 2023-04-09 ENCOUNTER — Other Ambulatory Visit: Payer: Self-pay | Admitting: Family Medicine

## 2023-04-09 DIAGNOSIS — J302 Other seasonal allergic rhinitis: Secondary | ICD-10-CM

## 2023-04-09 DIAGNOSIS — K219 Gastro-esophageal reflux disease without esophagitis: Secondary | ICD-10-CM

## 2023-04-30 ENCOUNTER — Encounter: Payer: Self-pay | Admitting: Family Medicine

## 2023-05-01 ENCOUNTER — Other Ambulatory Visit: Payer: Self-pay | Admitting: Family Medicine

## 2023-05-01 MED ORDER — HYDROXYZINE HCL 10 MG PO TABS: 10.0000 mg | ORAL_TABLET | Freq: Three times a day (TID) | ORAL | 0 refills | Status: AC | PRN

## 2023-05-09 MED ORDER — HYDROXYZINE HCL 25 MG PO TABS: 25.0000 mg | ORAL_TABLET | Freq: Three times a day (TID) | ORAL | 0 refills | Status: DC

## 2023-06-26 ENCOUNTER — Other Ambulatory Visit (HOSPITAL_COMMUNITY): Payer: Self-pay

## 2023-07-11 ENCOUNTER — Encounter: Payer: Self-pay | Admitting: Family Medicine

## 2023-07-11 ENCOUNTER — Ambulatory Visit (INDEPENDENT_AMBULATORY_CARE_PROVIDER_SITE_OTHER): Admitting: Family Medicine

## 2023-07-11 VITALS — BP 130/100 | HR 80 | Temp 98.3°F | Resp 18 | Ht 66.0 in | Wt 176.0 lb

## 2023-07-11 DIAGNOSIS — F419 Anxiety disorder, unspecified: Secondary | ICD-10-CM | POA: Diagnosis not present

## 2023-07-11 DIAGNOSIS — R700 Elevated erythrocyte sedimentation rate: Secondary | ICD-10-CM | POA: Insufficient documentation

## 2023-07-11 DIAGNOSIS — G4700 Insomnia, unspecified: Secondary | ICD-10-CM | POA: Diagnosis not present

## 2023-07-11 DIAGNOSIS — Z0000 Encounter for general adult medical examination without abnormal findings: Secondary | ICD-10-CM | POA: Insufficient documentation

## 2023-07-11 MED ORDER — DIAZEPAM 5 MG PO TABS: 5.0000 mg | ORAL_TABLET | Freq: Two times a day (BID) | ORAL | 0 refills | Status: DC | PRN

## 2023-07-11 MED ORDER — ZOLPIDEM TARTRATE 10 MG PO TABS
10.0000 mg | ORAL_TABLET | Freq: Every day | ORAL | 1 refills | Status: DC
Start: 2023-07-11 — End: 2023-07-15

## 2023-07-11 NOTE — Patient Instructions (Signed)
 Preventive Care 16-42 Years Old, Female  Preventive care refers to lifestyle choices and visits with your health care provider that can promote health and wellness. Preventive care visits are also called wellness exams.  What can I expect for my preventive care visit?  Counseling  Your health care provider may ask you questions about your:  Medical history, including:  Past medical problems.  Family medical history.  Pregnancy history.  Current health, including:  Menstrual cycle.  Method of birth control.  Emotional well-being.  Home life and relationship well-being.  Sexual activity and sexual health.  Lifestyle, including:  Alcohol, nicotine or tobacco, and drug use.  Access to firearms.  Diet, exercise, and sleep habits.  Work and work Astronomer.  Sunscreen use.  Safety issues such as seatbelt and bike helmet use.  Physical exam  Your health care provider will check your:  Height and weight. These may be used to calculate your BMI (body mass index). BMI is a measurement that tells if you are at a healthy weight.  Waist circumference. This measures the distance around your waistline. This measurement also tells if you are at a healthy weight and may help predict your risk of certain diseases, such as type 2 diabetes and high blood pressure.  Heart rate and blood pressure.  Body temperature.  Skin for abnormal spots.  What immunizations do I need?    Vaccines are usually given at various ages, according to a schedule. Your health care provider will recommend vaccines for you based on your age, medical history, and lifestyle or other factors, such as travel or where you work.  What tests do I need?  Screening  Your health care provider may recommend screening tests for certain conditions. This may include:  Lipid and cholesterol levels.  Diabetes screening. This is done by checking your blood sugar (glucose) after you have not eaten for a while (fasting).  Pelvic exam and Pap test.  Hepatitis B test.  Hepatitis C  test.  HIV (human immunodeficiency virus) test.  STI (sexually transmitted infection) testing, if you are at risk.  Lung cancer screening.  Colorectal cancer screening.  Mammogram. Talk with your health care provider about when you should start having regular mammograms. This may depend on whether you have a family history of breast cancer.  BRCA-related cancer screening. This may be done if you have a family history of breast, ovarian, tubal, or peritoneal cancers.  Bone density scan. This is done to screen for osteoporosis.  Talk with your health care provider about your test results, treatment options, and if necessary, the need for more tests.  Follow these instructions at home:  Eating and drinking    Eat a diet that includes fresh fruits and vegetables, whole grains, lean protein, and low-fat dairy products.  Take vitamin and mineral supplements as recommended by your health care provider.  Do not drink alcohol if:  Your health care provider tells you not to drink.  You are pregnant, may be pregnant, or are planning to become pregnant.  If you drink alcohol:  Limit how much you have to 0-1 drink a day.  Know how much alcohol is in your drink. In the [place] one drink equals one 12 oz bottle of beer (355 mL), one 5 oz glass of wine (148 mL), or one 1 oz glass of hard liquor (44 mL).  Lifestyle  Brush your teeth every morning and night with fluoride toothpaste. Floss one time each day.  Exercise for at least  30 minutes 5 or more days each week.  Do not use any products that contain nicotine or tobacco. These products include cigarettes, chewing tobacco, and vaping devices, such as e-cigarettes. If you need help quitting, ask your health care provider.  Do not use drugs.  If you are sexually active, practice safe sex. Use a condom or other form of protection to prevent STIs.  If you do not wish to become pregnant, use a form of birth control. If you plan to become pregnant, see your health care provider for a  prepregnancy visit.  Take aspirin only as told by your health care provider. Make sure that you understand how much to take and what form to take. Work with your health care provider to find out whether it is safe and beneficial for you to take aspirin daily.  Find healthy ways to manage stress, such as:  Meditation, yoga, or listening to music.  Journaling.  Talking to a trusted person.  Spending time with friends and family.  Minimize exposure to UV radiation to reduce your risk of skin cancer.  Safety  Always wear your seat belt while driving or riding in a vehicle.  Do not drive:  If you have been drinking alcohol. Do not ride with someone who has been drinking.  When you are tired or distracted.  While texting.  If you have been using any mind-altering substances or drugs.  Wear a helmet and other protective equipment during sports activities.  If you have firearms in your house, make sure you follow all gun safety procedures.  Seek help if you have been physically or sexually abused.  What's next?  Visit your health care provider once a year for an annual wellness visit.  Ask your health care provider how often you should have your eyes and teeth checked.  Stay up to date on all vaccines.  This information is not intended to replace advice given to you by your health care provider. Make sure you discuss any questions you have with your health care provider.  Document Revised: 06/28/2020 Document Reviewed: 06/28/2020  Elsevier Patient Education  2024 [employer].

## 2023-07-11 NOTE — Progress Notes (Signed)
 Established Patient Office Visit  Subjective   Patient ID: Angela Bryant, female    DOB: 1980-08-14  Age: 43 y.o. MRN: [id number]  Chief Complaint  Patient presents with   Annual Exam    HPI Discussed the use of AI scribe software for clinical note transcription with the patient, who gave verbal consent to proceed.  History of Present Illness Angela Bryant is a 43 year old female who presents for blood work to check inflammation levels and discuss symptoms of hot flashes and balance issues.  She is seeking blood work to evaluate inflammation levels due to previously elevated markers. She has a history of elevated sedimentation rate, last checked during her pregnancy, and is concerned about ongoing inflammation.  She experiences severe hot flashes, which are unusual for her, as she typically only feels hot in warm environments. She is concerned about being premenopausal. A hormone pellet was inserted, increasing her energy, but she is uncertain about continuing this treatment.  She describes episodes of feeling off balance when ascending stairs, particularly at her mother's house. This sensation occurs intermittently and is not associated with weakness. She feels 'wobbly' and unable to catch her balance, but it is not a constant issue.  She has a history of irritable bowel syndrome (IBS) and acknowledges the need to schedule a colonoscopy. She finds it challenging to take time off work for medical appointments.  She had an abortion following her last pregnancy. She smokes socially and works at [employer]. She does not engage in regular exercise [place]   Patient Active Problem List   Diagnosis Date Noted   Preventative health care 07/11/2023   Elevated sed rate 07/11/2023   Insomnia 02/08/2021   Diarrhea 11/23/2019   Gastroenteritis 11/02/2019   Palpitations 09/11/2018   Moderate asthma with acute exacerbation 03/28/2018    Anxiety 09/08/2017   Irritable bowel syndrome with constipation 12/28/2014   Chronic idiopathic constipation 06/10/2013   Allergic rhinitis 04/14/2006   Asthma 04/14/2006   GERD 04/14/2006   Past Medical History:  Diagnosis Date   Anxiety    Asthma    GERD (gastroesophageal reflux disease)    Insomnia    Irritable bowel syndrome    Past Surgical History:  Procedure Laterality Date   ANAL RECTAL MANOMETRY N/A 01/04/2015   Procedure: ANO RECTAL MANOMETRY;  Surgeon: Gustav Shila GAILS, MD;  Location: [facility];  Service: Endoscopy;  Laterality: N/A;   Social History   Tobacco Use   Smoking status: Light Smoker    Types: Cigarettes   Smokeless tobacco: Never   Tobacco comments:    rarely has a cigarrette, sometimes on weekends  Vaping Use   Vaping status: Never Used  Substance Use Topics   Alcohol use: Yes    Alcohol/week: 0.0 standard drinks of alcohol    Comment: occasional    Drug use: No   Social History   Socioeconomic History   Marital status: Divorced    Spouse name: Not on file   Number of children: 0   Years of education: Not on file   Highest education level: 12th grade  Occupational History   Occupation: t Firefighter: [employer]  Tobacco Use   Smoking status: Light Smoker    Types: Cigarettes   Smokeless tobacco: Never   Tobacco comments:    rarely has a cigarrette, sometimes on weekends  Vaping Use   Vaping status: Never Used  Substance and Sexual  Activity   Alcohol use: Yes    Alcohol/week: 0.0 standard drinks of alcohol    Comment: occasional    Drug use: No   Sexual activity: Yes    Partners: Male  Other Topics Concern   Not on file  Social History Narrative         Social Drivers of Health   Financial Resource Strain: Low Risk  (07/10/2023)   Overall Financial Resource Strain (CARDIA)    Difficulty of Paying Living Expenses: Not hard at all  Food Insecurity: No Food Insecurity (07/10/2023)   Hunger Vital Sign    Worried  About Running Out of Food in the Last Year: Never true    Ran Out of Food in the Last Year: Never true  Transportation Needs: No Transportation Needs (07/10/2023)   PRAPARE - Administrator, Civil Service (Medical): No    Lack of Transportation (Non-Medical): No  Physical Activity: Insufficiently Active (07/10/2023)   Exercise Vital Sign    Days of Exercise per Week: 3 days    Minutes of Exercise per Session: 20 min  Stress: Stress Concern Present (07/10/2023)   [employer] of Occupational Health - Occupational Stress Questionnaire    Feeling of Stress: To some extent  Social Connections: Socially Isolated (07/10/2023)   Social Connection and Isolation Panel    Frequency of Communication with Friends and Family: More than three times a week    Frequency of Social Gatherings with Friends and Family: Twice a week    Attends Religious Services: Never    Database administrator or Organizations: No    Attends Engineer, structural: Not on file    Marital Status: Divorced  Intimate Partner Violence: Unknown (04/17/2021)   Received from [facility]   HITS    Physically Hurt: Not on file    Insult or Talk Down To: Not on file    Threaten Physical Harm: Not on file    Scream or Curse: Not on file   Family Status  Relation Name Status   Father  Alive   Mother  Alive  No partnership data on file   Family History  Problem Relation Age of Onset   Hypertension Father    Asthma Mother    Allergies  Allergen Reactions   Codeine Nausea And Vomiting   Omnicef [Cefdinir] Hives   Other Other (See Comments)    Ethyl Cyanoacrylate- Positive patch test Paraphenylenediamine- Positive patch test      Review of Systems  Constitutional:  Negative for fever and malaise/fatigue.  HENT:  Negative for congestion.   Eyes:  Negative for blurred vision.  Respiratory:  Negative for shortness of breath.   Cardiovascular:  Negative for chest pain, palpitations and leg swelling.   Gastrointestinal:  Negative for abdominal pain, blood in stool and nausea.  Genitourinary:  Negative for dysuria and frequency.  Musculoskeletal:  Negative for falls.  Skin:  Negative for rash.  Neurological:  Negative for dizziness, loss of consciousness and headaches.  Endo/Heme/Allergies:  Negative for environmental allergies.  Psychiatric/Behavioral:  Negative for depression. The patient is not nervous/anxious.       Objective:     BP (!) 130/100 (BP Location: Left Arm, Patient Position: Sitting, Cuff Size: Normal)   Pulse 80   Temp 98.3 F (36.8 C) (Oral)   Resp 18   Ht 5' 6 (1.676 m)   Wt 176 lb (79.8 kg)   SpO2 99%   Breastfeeding No   BMI  28.41 kg/m  BP Readings from Last 3 Encounters:  07/11/23 (!) 130/100  02/11/23 (!) 143/87  02/07/23 118/80   Wt Readings from Last 3 Encounters:  07/11/23 176 lb (79.8 kg)  02/11/23 173 lb 8 oz (78.7 kg)  02/07/23 175 lb (79.4 kg)   SpO2 Readings from Last 3 Encounters:  07/11/23 99%  02/11/23 100%  02/07/23 99%      Physical Exam Vitals and nursing note reviewed.  Constitutional:      General: She is not in acute distress.    Appearance: Normal appearance. She is well-developed.  HENT:     Head: Normocephalic and atraumatic.   Eyes:     General: No scleral icterus.       Right eye: No discharge.        Left eye: No discharge.    Cardiovascular:     Rate and Rhythm: Normal rate and regular rhythm.     Heart sounds: No murmur heard. Pulmonary:     Effort: Pulmonary effort is normal. No respiratory distress.     Breath sounds: Normal breath sounds.   Musculoskeletal:        General: Normal range of motion.     Cervical back: Normal range of motion and neck supple.     Right lower leg: No edema.     Left lower leg: No edema.   Skin:    General: Skin is warm and dry.   Neurological:     Mental Status: She is alert and oriented to person, place, and time.   Psychiatric:        Mood and Affect: Mood  normal.        Behavior: Behavior normal.        Thought Content: Thought content normal.        Judgment: Judgment normal.      No results found for any visits on 07/11/23.  Last CBC Lab Results  Component Value Date   WBC 9.5 02/07/2023   HGB 11.9 (L) 02/07/2023   HCT 35.7 (L) 02/07/2023   MCV 89.7 02/07/2023   MCH 30.6 03/19/2012   RDW 15.0 02/07/2023   PLT 205.0 02/07/2023   Last metabolic panel Lab Results  Component Value Date   GLUCOSE 86 02/07/2023   NA 136 02/07/2023   K 3.7 02/07/2023   CL 106 02/07/2023   CO2 22 02/07/2023   BUN 7 02/07/2023   CREATININE 0.57 02/07/2023   GFR 112.45 02/07/2023   CALCIUM 9.1 02/07/2023   PROT 7.0 02/07/2023   ALBUMIN 4.1 02/07/2023   BILITOT 0.3 02/07/2023   ALKPHOS 39 02/07/2023   AST 12 02/07/2023   ALT 12 02/07/2023   Last lipids No results found for: CHOL, HDL, LDLCALC, LDLDIRECT, TRIG, CHOLHDL Last hemoglobin A1c No results found for: HGBA1C Last thyroid  functions Lab Results  Component Value Date   TSH 1.31 02/07/2023   T4TOTAL 7.1 09/11/2018   Last vitamin D  Lab Results  Component Value Date   VD25OH 33.65 04/12/2021   Last vitamin B12 and Folate Lab Results  Component Value Date   VITAMINB12 343 04/12/2021      The ASCVD Risk score (Arnett DK, et al., 2019) failed to calculate for the following reasons:   Cannot find a previous HDL lab   Cannot find a previous total cholesterol lab    Assessment & Plan:   Problem List Items Addressed This Visit       Unprioritized   Insomnia   Relevant Medications  zolpidem  (AMBIEN ) 10 MG tablet   Other Relevant Orders   Drug Monitoring Panel [phone] , Urine   Anxiety   Relevant Medications   diazepam  (VALIUM ) 5 MG tablet   Other Relevant Orders   Drug Monitoring Panel [phone] , Urine   Preventative health care - Primary   Ghm utd Check labs  See AVS  Health Maintenance  Topic Date Due   HIV Screening  Never done   Hepatitis C  Screening  Never done   Pneumococcal Vaccine 27-4 Years old (1 of 2 - PCV) Never done   Hepatitis B Vaccines (1 of 3 - 19+ 3-dose series) Never done   HPV VACCINES (1 - 3-dose SCDM series) Never done   DTaP/Tdap/Td (2 - Tdap) 02/08/2015   Cervical Cancer Screening (HPV/Pap Cotest)  04/08/2022   COVID-19 Vaccine (4 - 2024-25 season) 09/15/2022   INFLUENZA VACCINE  08/15/2023   Meningococcal B Vaccine  Aged Out         Relevant Orders   CBC with Differential/Platelet   Comprehensive metabolic panel with GFR   Lipid panel   TSH   Elevated sed rate   Relevant Orders   Sedimentation rate  Assessment and Plan Assessment & Plan Perimenopausal symptoms   She experiences severe hot flashes following a recent abortion and is concerned about being premenopausal. A hormone pellet was inserted by a hormone specialist, which has increased her energy levels. Discuss perimenopausal symptoms and management options.  Balance issues   She reports intermittent balance issues when ascending stairs, feeling wobbly on one foot without associated weakness. Symptoms occur randomly. Consider further evaluation if symptoms persist or worsen.  Elevated erythrocyte sedimentation rate (ESR)   Previous labs showed elevated ESR, which decreased but remained high. The last test was during pregnancy, potentially affecting results. She wishes to recheck levels to rule out underlying issues. Order ESR test to evaluate current inflammation levels.  Irritable Bowel Syndrome (IBS)   She acknowledges the need for a colonoscopy but has difficulty scheduling due to work commitments. Encourage scheduling of colonoscopy for IBS management.  General Health Maintenance   She is a social smoker and does not engage in regular exercise. She has a busy social schedule, including [place] and [place] social events. Advise on smoking cessation and encourage regular physical activity.    Return in  about 6 months (around 01/10/2024), or if symptoms worsen or fail to improve.    Angela Tagliaferro R Lowne Chase, DO

## 2023-07-11 NOTE — Assessment & Plan Note (Signed)
 Ghm utd Check labs  See AVS  Health Maintenance  Topic Date Due   HIV Screening  Never done   Hepatitis C Screening  Never done   Pneumococcal Vaccine 102-42 Years old (1 of 2 - PCV) Never done   Hepatitis B Vaccines (1 of 3 - 19+ 3-dose series) Never done   HPV VACCINES (1 - 3-dose SCDM series) Never done   DTaP/Tdap/Td (2 - Tdap) 02/08/2015   Cervical Cancer Screening (HPV/Pap Cotest)  04/08/2022   COVID-19 Vaccine (4 - 2024-25 season) 09/15/2022   INFLUENZA VACCINE  08/15/2023   Meningococcal B Vaccine  Aged Out

## 2023-07-12 LAB — COMPREHENSIVE METABOLIC PANEL WITH GFR
AG Ratio: 1.5 (calc) (ref 1.0–2.5)
ALT: 18 U/L (ref 6–29)
AST: 17 U/L (ref 10–30)
Albumin: 4.3 g/dL (ref 3.6–5.1)
Alkaline phosphatase (APISO): 48 U/L (ref 31–125)
BUN: 9 mg/dL (ref 7–25)
CO2: 24 mmol/L (ref 20–32)
Calcium: 9.8 mg/dL (ref 8.6–10.2)
Chloride: 105 mmol/L (ref 98–110)
Creat: 0.7 mg/dL (ref 0.50–0.99)
Globulin: 2.8 g/dL (ref 1.9–3.7)
Glucose, Bld: 91 mg/dL (ref 65–99)
Potassium: 4.5 mmol/L (ref 3.5–5.3)
Sodium: 140 mmol/L (ref 135–146)
Total Bilirubin: 0.3 mg/dL (ref 0.2–1.2)
Total Protein: 7.1 g/dL (ref 6.1–8.1)
eGFR: 111 mL/min/{1.73_m2} (ref >=60)

## 2023-07-12 LAB — CBC WITH DIFFERENTIAL/PLATELET
Absolute Lymphocytes: 2661 {cells}/uL (ref 850–3900)
Absolute Monocytes: 872 {cells}/uL (ref 200–950)
Basophils Absolute: 62 {cells}/uL (ref 0–200)
Basophils Relative: 0.7 %
Eosinophils Absolute: 134 {cells}/uL (ref 15–500)
Eosinophils Relative: 1.5 %
HCT: 38 % (ref 35.0–45.0)
Hemoglobin: 11.8 g/dL (ref 11.7–15.5)
MCH: 27 pg (ref 27.0–33.0)
MCHC: 31.1 g/dL — ABNORMAL LOW (ref 32.0–36.0)
MCV: 87 fL (ref 80.0–100.0)
MPV: 11.6 fL (ref 7.5–12.5)
Monocytes Relative: 9.8 %
Neutro Abs: 5171 {cells}/uL (ref 1500–7800)
Neutrophils Relative %: 58.1 %
Platelets: 301 10*3/uL (ref 140–400)
RBC: 4.37 10*6/uL (ref 3.80–5.10)
RDW: 15 % (ref 11.0–15.0)
Total Lymphocyte: 29.9 %
WBC: 8.9 10*3/uL (ref 3.8–10.8)

## 2023-07-12 LAB — LIPID PANEL
Cholesterol: 166 mg/dL (ref <200)
HDL: 37 mg/dL — ABNORMAL LOW (ref >=50)
LDL Cholesterol (Calc): 104 mg/dL — ABNORMAL HIGH
Non-HDL Cholesterol (Calc): 129 mg/dL (ref <130)
Total CHOL/HDL Ratio: 4.5 (calc) (ref <5.0)
Triglycerides: 155 mg/dL — ABNORMAL HIGH (ref <150)

## 2023-07-12 LAB — SEDIMENTATION RATE: Sed Rate: 34 mm/h — ABNORMAL HIGH (ref 0–20)

## 2023-07-12 LAB — TSH: TSH: 1.77 m[IU]/L

## 2023-07-12 LAB — SPECIMEN COMPROMISED

## 2023-07-13 ENCOUNTER — Ambulatory Visit: Payer: Self-pay | Admitting: Family Medicine

## 2023-07-13 LAB — DRUG MONITORING PANEL 376104, URINE
Amphetamines: NEGATIVE ng/mL (ref <500)
Barbiturates: NEGATIVE ng/mL (ref <300)
Benzodiazepines: NEGATIVE ng/mL (ref <100)
Cocaine Metabolite: NEGATIVE ng/mL (ref <150)
Desmethyltramadol: NEGATIVE ng/mL (ref <100)
Opiates: NEGATIVE ng/mL (ref <100)
Oxycodone: NEGATIVE ng/mL (ref <100)
Tramadol: NEGATIVE ng/mL (ref <100)

## 2023-07-13 LAB — DM TEMPLATE

## 2023-07-14 ENCOUNTER — Encounter: Payer: Self-pay | Admitting: Family Medicine

## 2023-07-14 ENCOUNTER — Other Ambulatory Visit: Payer: Self-pay | Admitting: Family Medicine

## 2023-07-14 DIAGNOSIS — G4700 Insomnia, unspecified: Secondary | ICD-10-CM

## 2023-07-14 NOTE — Telephone Encounter (Signed)
 Copied from CRM [id number]. Topic: Clinical - Medication Refill >> Jul 14, 2023  1:26 PM Aleatha C wrote: Medication: zolpidem  (AMBIEN ) 10 MG tablet  Has the patient contacted their pharmacy? No (Agent: If no, request that the patient contact the pharmacy for the refill. If patient does not wish to contact the pharmacy document the reason why and proceed with request.) (Agent: If yes, when and what did the pharmacy advise?)    [id number] - [place], [place] - [address] AT [facility] [address] [place] [address] Phone: [phone] Fax: [phone]    Is this the correct pharmacy for this prescription? Yes If no, delete pharmacy and type the correct one.   Has the prescription been filled recently? No  Is the patient out of the medication? Yes  Has the patient been seen for an appointment in the last year OR does the patient have an upcoming appointment? Yes  Can we respond through MyChart? No  Agent: Please be advised that Rx refills may take up to 3 business days. We ask that you follow-up with your pharmacy.

## 2023-07-15 ENCOUNTER — Other Ambulatory Visit: Payer: Self-pay | Admitting: Family Medicine

## 2023-07-15 DIAGNOSIS — G4700 Insomnia, unspecified: Secondary | ICD-10-CM

## 2023-07-15 MED ORDER — ZOLPIDEM TARTRATE 10 MG PO TABS: 10.0000 mg | ORAL_TABLET | Freq: Every day | ORAL | 1 refills | Status: DC

## 2023-07-15 MED ORDER — ZOLPIDEM TARTRATE 10 MG PO TABS: 10.0000 mg | ORAL_TABLET | Freq: Every day | ORAL | 0 refills | Status: DC

## 2023-07-31 ENCOUNTER — Encounter: Payer: Self-pay | Admitting: Family Medicine

## 2023-08-07 ENCOUNTER — Other Ambulatory Visit: Payer: Self-pay | Admitting: Family Medicine

## 2023-08-07 ENCOUNTER — Telehealth: Payer: Self-pay

## 2023-08-07 ENCOUNTER — Other Ambulatory Visit (HOSPITAL_COMMUNITY): Payer: Self-pay

## 2023-08-07 MED ORDER — ZEPBOUND 2.5 MG/0.5ML ~~LOC~~ SOAJ: 2.5000 mg | SUBCUTANEOUS | 0 refills | Status: AC

## 2023-08-07 NOTE — Telephone Encounter (Signed)
 Pharmacy Patient Advocate Encounter   Received notification from CoverMyMeds that prior authorization for Zepbound  2.5MG /0.5ML pen-injectors is required/requested.   Insurance verification completed.   The patient is insured through [facility] [place] .   Per test claim: PA required; PA submitted to above mentioned insurance via CoverMyMeds Key/confirmation #/EOC [id number] Status is pending

## 2023-08-07 NOTE — Telephone Encounter (Signed)
 Pharmacy Patient Advocate Encounter  Received notification from [employer] that Prior Authorization for Zepbound  2.5MG /0.5ML pen-injectors  has been APPROVED from 07/08/23 to 04/03/24   PA #/Case ID/Reference #: [id number]

## 2023-08-08 ENCOUNTER — Encounter: Payer: Self-pay | Admitting: Family Medicine

## 2023-09-04 ENCOUNTER — Encounter: Payer: Self-pay | Admitting: Family Medicine

## 2023-09-04 DIAGNOSIS — K5901 Slow transit constipation: Secondary | ICD-10-CM

## 2023-09-05 MED ORDER — LINACLOTIDE 72 MCG PO CAPS: 72.0000 ug | ORAL_CAPSULE | Freq: Every day | ORAL | 3 refills | Status: AC

## 2023-09-19 ENCOUNTER — Other Ambulatory Visit: Payer: Self-pay | Admitting: Family Medicine

## 2023-09-19 ENCOUNTER — Ambulatory Visit: Payer: Self-pay

## 2023-09-19 ENCOUNTER — Encounter: Payer: Self-pay | Admitting: Family Medicine

## 2023-09-19 MED ORDER — FLUTICASONE-SALMETEROL 500-50 MCG/ACT IN AEPB: 1.0000 | INHALATION_SPRAY | Freq: Two times a day (BID) | RESPIRATORY_TRACT | 2 refills | Status: DC

## 2023-09-19 MED ORDER — FLUTICASONE-SALMETEROL 250-50 MCG/ACT IN AEPB: 1.0000 | INHALATION_SPRAY | Freq: Two times a day (BID) | RESPIRATORY_TRACT | 5 refills | Status: AC

## 2023-09-19 NOTE — Telephone Encounter (Signed)
 FYI Only or Action Required?: Action required by provider: Rx correction.  Patient was last seen in primary care on 07/11/2023 by Antonio Meth, Jamee SAUNDERS, DO.  Called Nurse Triage reporting Medication Problem.   Triage Disposition: Call PCP Now  Patient/caregiver understands and will follow disposition?: Yes      Copied from CRM #[id number]. Topic: Clinical - Prescription Issue >> Sep 19, 2023  5:08 PM Dedra B wrote: Reason for CRM: Pt is out of Advair. She requested a refill. Advair 500 mg was sent but she uses 250 mg and says she never used 500 mg. Reason for Disposition  [1] Prescription not at pharmacy AND [2] was prescribed by doctor (or NP/PA) recently  (Exception: [facility] has access to EMR and prescription is recorded there. Go to Home Care and confirm for pharmacy.)  Answer Assessment - Initial Assessment Questions 1. NAME of MEDICINE: What medicine(s) are you calling about?     Advair 2. QUESTION: What is your question? (e.g., double dose of medicine, side effect)     Pt reports that she has never been on 500 mg dosage, has only been on 250mg  dosage for over 10+years 3. PRESCRIBER: Who prescribed the medicine? Reason: if prescribed by specialist, call should be referred to that group.     PCP 4. SYMPTOMS: Do you have any symptoms? If Yes, ask: What symptoms are you having?  How bad are the symptoms (e.g., mild, moderate, severe)     Pt reports that advair controls her asthma 5. PREGNANCY:  Is there any chance that you are pregnant? When was your last menstrual period?     N/a  Protocols used: Medication Question Call-A-AH

## 2023-09-19 NOTE — Telephone Encounter (Signed)
 Ellouise, Technician from [facility] called and says the patient normally takes advair 250 and they have the script for 500. Advised I will have to call the on call to clarify and to let the patient know she will receive a call back.   Copied from CRM [id number]. Topic: Clinical - Prescription Issue >> Sep 19, 2023  5:18 PM [place] J wrote: Reason for CRM: A pharmacist is on the line because the patient is present to pick up her fluticasone -salmeterol (ADVAIR) 250 MCG/ACT AEPB. The only thing that was sent is the fluticasone -salmeterol (ADVAIR) 500-50 MCG/ACT AEPB and the pharmacist would like if the script could be adjusted.

## 2023-09-19 NOTE — Progress Notes (Signed)
 Received after hrs call as pt reportedly should have received 250 mcg Advair. This was sent now.

## 2023-09-22 NOTE — Telephone Encounter (Signed)
Corrected Rx sent

## 2023-10-02 ENCOUNTER — Telehealth: Payer: Self-pay | Admitting: Pediatrics

## 2023-10-02 NOTE — Telephone Encounter (Signed)
 Inbound call from patient states she is experiencing some abdominal pain. Requesting to speak with a nurse. Please advise.

## 2023-10-02 NOTE — Telephone Encounter (Signed)
 Called and spoke with patient. Patient reports that at her last OV it was recommended that she proceed with EGD/colonoscopy but patient was pregnant at the time. Patient states that she did not have the baby. Patient reports continued symptoms. I offered to send message to see if she could be scheduled for direct procedures or I can get patient scheduled for an OV for updated evaluation and recommendations. Patient opted to schedule OV and discuss endoscopic procedures again. Patient has been scheduled for a follow up with Alan Coombs, PA-C on Friday, 10/10/23 at 8:40 am. Patient verbalized understanding and had no concerns at the end of the call.

## 2023-10-03 NOTE — Telephone Encounter (Signed)
 Patient confirmed

## 2023-10-03 NOTE — Telephone Encounter (Signed)
 Called and left patient a detailed vm letting her know that the soonest appt I have is Tuesday, 10/07/23 at 8:40 am with Alan, [place]. I have moved patient' appt. I have asked that patient call or send MyChart message to confirm appt.

## 2023-10-03 NOTE — Telephone Encounter (Addendum)
 Patient confirmed appt for Tuesday but also wanted to discuss current symptoms. Patient reports that her last BM was Tuesday. Patient states that she is barely passing any stool and it is small and hard. Patient states that she has taken Linzess  72 mcg, Miralax fast act tablets, Senna and tried an enema this morning and passed barely anything. Patient stated that she is not passing gas. I advised patient to go to ER for possible manual disimpaction and to rule out obstruction/blockage. Patient does not want to go to ER. I informed patient that she should not keep taking stool softeners and laxatives if she has a blockage because it won't go anywhere. Patient insisted on additional recommendations. I advised patient to attempt another enema today followed by a Miralax bowel purge. Patient has access to MyChart and is aware that I have sent bowel urge instructions there. I reviewed ER precautions with patient again. Patient verbalized understanding and had no concerns at the end of the call.  MyChart message sent to patient with purge instructions and ER precautions.

## 2023-10-03 NOTE — Telephone Encounter (Signed)
 Inbound call from patient stated that she was wondering if she could get a sooner appointment then the one she was given due to her trying taken her medication and she has tried over the counter with medication and still has not been able to make a bowel movement. Patient stated that she has made a bowel movement in days and is start to feel sick at work. Patient is requesting a call back. Please advise.

## 2023-10-06 ENCOUNTER — Other Ambulatory Visit: Payer: Self-pay | Admitting: Family Medicine

## 2023-10-06 DIAGNOSIS — K219 Gastro-esophageal reflux disease without esophagitis: Secondary | ICD-10-CM

## 2023-10-06 DIAGNOSIS — J302 Other seasonal allergic rhinitis: Secondary | ICD-10-CM

## 2023-10-06 NOTE — Progress Notes (Unsigned)
 10/07/2023 Angela Bryant [id number] 10-Apr-1980  Referring provider: Antonio Meth, Jamee SAUNDERS, * Primary GI doctor: Dr. Suzann  ASSESSMENT AND PLAN:  IBS mixed predominantly constipation, worse last several months, no new meds other than dupixent 03/01/2013 colonoscopy 12/2014 unremarkable anal manometry 02/05/2022 CT ABP No bowel obstruction, unremarkable 02/07/2023 CRP negative, sed rate 45, negative celiac normal thyroid , normal kidney liver, normocytic anemia Had bowel purge Friday night, has not been taking the linzess  - Increase fiber/ water intake, decrease caffeine, increase activity level. -Will get KUB - will do bowel purge and linzess  145 mcg samples given - schedule for colonoscopy with 2 day prep at [facility], We have discussed the risks of bleeding, infection, perforation, medication reactions, and remote risk of death associated with colonoscopy. All questions were answered and the patient acknowledges these risk and wishes to proceed.  GERD Sounds like LPR symptoms On dupixent for eczema/allergies/asthma, no dysphagia On prilosec 40 mg once a day with continuing issues -Continue prilsec 40 mg daily -Add on alginate therapy -Schedule EGD with colonoscopy to evaluate for EOE, gastritis, H pylori, etc, I discussed risks of EGD with patient today, including risk of sedation, bleeding or perforation.  Patient provides understanding and gave verbal consent to proceed.  Obesity  Body mass index is 28.75 kg/m.  Was prescribed Zepbound  but has not started yet, suggest not starting at this time -Patient has been advised to make an attempt to improve diet and exercise patterns to aid in weight loss. -Recommended diet heavy in fruits and veggies and low in animal meats, cheeses, and dairy products, appropriate calorie intake  I have reviewed the clinic note as outlined by and agree with the assessment, plan and medical decision making.  [name] presents to the office for  evaluation and management of IBS with constipation predominance although some diarrhea and GERD.  Initially Linzess  was beneficial but then resulted in bloating and difficulty with her work schedule.  Last colonoscopy was performed in 2015 and was unremarkable.  Endorses abdominal bloating.  Also having symptoms of frequent throat clearing.  Unclear if this is GERD/LPR.  She is currently on Dupixent as well as Prilosec with symptoms despite these modalities.  Agree it is reasonable to proceed with EGD and colonoscopy at the same time for further investigation of symptoms.  Angela Suzann, MD   Patient Care Team: Antonio Meth, Jamee SAUNDERS, DO as PCP - General (Family Medicine) Tobb, Kardie, DO as PCP - Cardiology (Cardiology)  HISTORY OF PRESENT ILLNESS: 43 y.o. female with a past medical history listed below presents for evaluation of constipation and GERD.   Patient last seen in the office February 07, 2023 by Elida Nyle Sharps, NP for GERD and chronic constipation.  There is a plan for EGD colonoscopy however patient had positive hCG and these were canceled, pregnancy was terminated, she presents for follow up with continuing symptoms.  Discussed the use of AI scribe software for clinical note transcription with the patient, who gave verbal consent to proceed.  History of Present Illness   Angela Bryant is a 43 year old female with chronic constipation and reflux who presents with bloating and irregular bowel movements.  She experiences bloating and irregular bowel movements, attempting to manage symptoms with dietary changes such as eating salads and drinking coffee. A recent bowel purge provided minimal relief, and she has not had a bowel movement since. She takes Linzess  inconsistently due to its effects on her bowel movements at work. Initially  effective, Linzess  now causes increased bloating and discomfort.  She has not started Zepbound , prescribed for weight loss, due to concerns  about potential constipation, a side effect she heard from others. She is on Dupixent for eczema, which also aids her asthma, taken every two weeks at 300 mg.  Her reflux symptoms include frequent throat clearing and a sensation of needing to clear her throat constantly. She takes 40 mg of Prilosec daily, sometimes increasing to twice daily if symptoms worsen. No trouble swallowing is noted, and she is unsure if she has had an EGD before. She has had a colonoscopy in the past.  In January 2024, she underwent a CT scan for abdominal pain; she recalls being told there was no bowel obstruction, but was informed about a possible ovarian issue and possible appendicitis. Her appendix was not removed, and surgery was advised against at that time.  She has allergies to tree nuts and certain fruits like bananas and kiwis, causing her throat to close up. She has not had any surgeries or children. Recently, she added a multivitamin with zinc to her regimen and uses a face moisturizer with collagen.        She  reports that she has been smoking cigarettes. She has never used smokeless tobacco. She reports current alcohol use. She reports that she does not use drugs.  RELEVANT GI HISTORY, IMAGING AND LABS: Results   RADIOLOGY Abdominal CT: Right ovarian dermoid, no bowel obstruction, possible early acute appendicitis (01/2022)      CBC    Component Value Date/Time   WBC 8.9 07/11/2023 1635   RBC 4.37 07/11/2023 1635   HGB 11.8 07/11/2023 1635   HCT 38.0 07/11/2023 1635   PLT 301 07/11/2023 1635   MCV 87.0 07/11/2023 1635   MCH 27.0 07/11/2023 1635   MCHC 31.1 (L) 07/11/2023 1635   RDW 15.0 07/11/2023 1635   LYMPHSABS 1.5 02/07/2023 1013   MONOABS 0.7 02/07/2023 1013   EOSABS 134 07/11/2023 1635   BASOSABS 62 07/11/2023 1635   Recent Labs    02/07/23 1013 07/11/23 1635  HGB 11.9* 11.8    CMP     Component Value Date/Time   NA 140 07/11/2023 1635   K 4.5 07/11/2023 1635   CL 105  07/11/2023 1635   CO2 24 07/11/2023 1635   GLUCOSE 91 07/11/2023 1635   BUN 9 07/11/2023 1635   CREATININE 0.70 07/11/2023 1635   CALCIUM 9.8 07/11/2023 1635   PROT 7.1 07/11/2023 1635   ALBUMIN 4.1 02/07/2023 1013   AST 17 07/11/2023 1635   ALT 18 07/11/2023 1635   ALKPHOS 39 02/07/2023 1013   BILITOT 0.3 07/11/2023 1635   GFRNONAA >90 03/19/2012 0231   GFRAA >90 03/19/2012 0231      Latest Ref Rng & Units 07/11/2023    4:35 PM 02/07/2023   10:13 AM 02/05/2022    1:59 PM  Hepatic Function  Total Protein 6.1 - 8.1 g/dL 7.1  7.0  6.8   Albumin 3.5 - 5.2 g/dL  4.1  3.9   AST 10 - 30 U/L 17  12  11    ALT 6 - 29 U/L 18  12  11    Alk Phosphatase 39 - 117 U/L  39  53   Total Bilirubin 0.2 - 1.2 mg/dL 0.3  0.3  0.6       Current Medications:    Current Outpatient Medications (Cardiovascular):    EPINEPHrine  (EPIPEN  2-PAK) 0.3 mg/0.3 mL IJ SOAJ injection, Inject  0.3 mg into the muscle as needed for anaphylaxis.  Current Outpatient Medications (Respiratory):    albuterol  (PROVENTIL  HFA;VENTOLIN  HFA) 108 (90 Base) MCG/ACT inhaler, Inhale 2 puffs into the lungs every 6 (six) hours as needed. Reported on 02/01/2015   cetirizine  (ZYRTEC ) 10 MG tablet, Take 1 tablet (10 mg total) by mouth daily.   fluticasone -salmeterol (ADVAIR) 250-50 MCG/ACT AEPB, Inhale 1 puff into the lungs in the morning and at bedtime.   montelukast  (SINGULAIR ) 10 MG tablet, TAKE 1 TABLET AT BEDTIME   ipratropium (ATROVENT ) 0.06 % nasal spray, Use 2 sprays in each nostril up to 4 times daily for runny nose. (Patient not taking: Reported on 10/07/2023)  Current Outpatient Medications (Analgesics):    ibuprofen  (ADVIL ) 800 MG tablet, Take 1 tablet (800 mg total) by mouth every 8 (eight) hours as needed.   Current Outpatient Medications (Other):    diazepam  (VALIUM ) 5 MG tablet, Take 1 tablet (5 mg total) by mouth every 12 (twelve) hours as needed for anxiety.   dicyclomine  (BENTYL ) 10 MG capsule, Take 1 capsule (10  mg total) by mouth every 8 (eight) hours as needed for spasms.   DUPIXENT 300 MG/2ML prefilled syringe, Inject 300 mg into the skin every 14 (fourteen) days.   hydrOXYzine  (ATARAX ) 10 MG tablet, Take 1 tablet (10 mg total) by mouth 3 (three) times daily as needed for anxiety.   linaclotide  (LINZESS ) 72 MCG capsule, Take 1 capsule (72 mcg total) by mouth daily before breakfast.   omeprazole  (PRILOSEC) 40 MG capsule, TAKE 1 CAPSULE DAILY   tirzepatide  (ZEPBOUND ) 2.5 MG/0.5ML Pen, Inject 2.5 mg into the skin once a week.   zolpidem  (AMBIEN ) 10 MG tablet, Take 1 tablet (10 mg total) by mouth at bedtime.   famotidine  (PEPCID ) 20 MG tablet, Take 1 tablet (20 mg total) by mouth at bedtime. (Patient not taking: Reported on 10/07/2023)   zolpidem  (AMBIEN ) 10 MG tablet, Take 1 tablet (10 mg total) by mouth at bedtime.  Medical History:  Past Medical History:  Diagnosis Date   Anxiety    Asthma    GERD (gastroesophageal reflux disease)    Insomnia    Irritable bowel syndrome    Allergies:  Allergies  Allergen Reactions   Codeine Nausea And Vomiting   Omnicef [Cefdinir] Hives   Other Other (See Comments)    Ethyl Cyanoacrylate- Positive patch test Paraphenylenediamine- Positive patch test     Surgical History:  She  has a past surgical history that includes Anal Rectal manometry (N/A, 01/04/2015). Family History:  Her family history includes Asthma in her mother; Hypertension in her father.  REVIEW OF SYSTEMS  : All other systems reviewed and negative except where noted in the History of Present Illness.  PHYSICAL EXAM: BP 126/76 (BP Location: Left Arm, Patient Position: Sitting, Cuff Size: Normal)   Pulse 80   Ht 5' 6 (1.676 m)   Wt 178 lb 2 oz (80.8 kg)   BMI 28.75 kg/m  Physical Exam   GENERAL APPEARANCE: Well nourished, in no apparent distress. HEENT: No cervical lymphadenopathy, unremarkable thyroid , sclerae anicteric, conjunctiva pink. RESPIRATORY: Respiratory effort normal,  breath sounds equal bilaterally without rales, rhonchi, or wheezing. CARDIO: Regular rate and rhythm with no murmurs, rubs, or gallops, peripheral pulses intact. ABDOMEN: Soft, non-distended, active bowel sounds in all four quadrants, no tenderness to palpation, no rebound, no mass appreciated, abdomen normal. RECTAL: Declines. MUSCULOSKELETAL: Full range of motion, normal gait, without edema. SKIN: Dry, intact without rashes or lesions. No jaundice.  NEURO: Alert, oriented, no focal deficits. PSYCH: Cooperative, normal mood and affect.      Alan JONELLE Coombs, PA-C 9:19 AM

## 2023-10-07 ENCOUNTER — Ambulatory Visit (INDEPENDENT_AMBULATORY_CARE_PROVIDER_SITE_OTHER)
Admission: RE | Admit: 2023-10-07 | Discharge: 2023-10-07 | Disposition: A | Discharge status: Home / Self Care | Source: Ambulatory Visit | Attending: Physician Assistant | Admitting: Physician Assistant

## 2023-10-07 ENCOUNTER — Encounter: Payer: Self-pay | Admitting: Physician Assistant

## 2023-10-07 ENCOUNTER — Ambulatory Visit: Admitting: Physician Assistant

## 2023-10-07 VITALS — BP 126/76 | HR 80 | Ht 66.0 in | Wt 178.1 lb

## 2023-10-07 DIAGNOSIS — K581 Irritable bowel syndrome with constipation: Secondary | ICD-10-CM | POA: Diagnosis not present

## 2023-10-07 DIAGNOSIS — K219 Gastro-esophageal reflux disease without esophagitis: Secondary | ICD-10-CM | POA: Diagnosis not present

## 2023-10-07 DIAGNOSIS — K5904 Chronic idiopathic constipation: Secondary | ICD-10-CM

## 2023-10-07 DIAGNOSIS — E669 Obesity, unspecified: Secondary | ICD-10-CM | POA: Diagnosis not present

## 2023-10-07 DIAGNOSIS — R1084 Generalized abdominal pain: Secondary | ICD-10-CM | POA: Diagnosis not present

## 2023-10-07 DIAGNOSIS — J45901 Unspecified asthma with (acute) exacerbation: Secondary | ICD-10-CM

## 2023-10-07 MED ORDER — NA SULFATE-K SULFATE-MG SULF 17.5-3.13-1.6 GM/177ML PO SOLN: 1.0000 | Freq: Once | ORAL | 0 refills | Status: AC

## 2023-10-07 NOTE — Patient Instructions (Addendum)
 _______________________________________________________  If your blood pressure at your visit was 140/90 or greater, please contact your primary care physician to follow up on this.  _______________________________________________________  If you are age 42 or older, your body mass index should be between 23-30. Your Body mass index is 28.75 kg/m. If this is out of the aforementioned range listed, please consider follow up with your Primary Care Provider.  If you are age 71 or younger, your body mass index should be between 19-25. Your Body mass index is 28.75 kg/m. If this is out of the aformentioned range listed, please consider follow up with your Primary Care Provider.   ________________________________________________________  The [facility] providers would like to encourage you to use MYCHART to communicate with providers for non-urgent requests or questions.  Due to long hold times on the telephone, sending your provider a message by [facility] may be a faster and more efficient way to get a response.  Please allow 48 business hours for a response.  Please remember that this is for non-urgent requests.  _______________________________________________________  [facility] is using a team-based approach to care.  Your team is made up of your doctor and two to three APPS. Our APPS (Nurse Practitioners and Physician Assistants) work with your physician to ensure care continuity for you. They are fully qualified to address your health concerns and develop a treatment plan. They communicate directly with your gastroenterologist to care for you. Seeing the Advanced Practice Practitioners on your physician's team can help you by facilitating care more promptly, often allowing for earlier appointments, access to diagnostic testing, procedures, and other specialty referrals.   Your provider has requested that you have an abdominal x ray before leaving today. Please go to the basement floor  to our Radiology department for the test.  We have sent the following medications to your pharmacy for you to pick up at your convenience: Suprep  Please do the following: Purchase a bottle of Miralax over the counter as well as a box of 5 mg dulcolax tablets. Take 4 dulcolax tablets. Wait 1 hour. You will then drink 6-8 capfuls of Miralax mixed in an adequate amount of water/juice/gatorade (you may choose which of these liquids to drink) over the next 2-3 hours. You should expect results within 1 to 6 hours after completing the bowel purge. Go to the er if you have severe AB pain, can not pass gas or stool in over 12 hours, can not hold down any food.   Reflux Gourmet Rescue  It is an ALGINATE THERAPY which is the only intervention that works to safeguard the esophagus by creating a protective barrier that actually stops reflux from happening. -The general directions for use are as stated on the packaging: Take 1 teaspoon (5 ml), or more as needed or as directed by your physician, after meals and before bed. -These general directions address the most common times for reflux to occur, but our Rescue products may be taken anytime. Some individuals may take our product preemptively, when they know they will suffer from reflux, or as needed - when discomfort arises. (If taken around food, it should be consumed last.) -You do not have to take 1 teaspoon (5 ml) of the product. While one teaspoon (5ml) may be the perfect average amount to relieve reflux suffering in some, others may require more or less. You may adjust the amount of Mint Chocolate Rescue and Vanilla Caramel Rescue to the lowest amount necessary to meet your individual needs to improve  your quality of life. -You may dilute the product if it is too viscous for you to consume. Keep in mind, however, that the thickness of the product was formulated to provide optimal coating and protection of your throat and esophagus. Though diluting the  product is possible, it may reduce the protective function and/or length of action. -This can be used in conjunction with reflux medications and lifestyle changes.  100% ALL-NATURAL  Paraben FREE, glycerin FREE, & potassium FREE  Made entirely from all-natural ingredients considered safe for children and during pregnancy  No known side effects  All-natural flavor Gluten FREE  Allergen FREE  Vegan  Can find more information here: NameSeizer.co.nz    Linzess  try the 145 mcg samples, if this is too much try 72 mcg *IBS-C patients may begin to experience relief from belly pain and overall abdominal symptoms (pain, discomfort, and bloating) in about 1 week,  with symptoms typically improving over 12 weeks.  Take at least 30 minutes before the first meal of the day on an empty stomach You can have a loose stool if you eat a high-fat breakfast. Give it at least 7 days, may have more bowel movements during that time.   The diarrhea should go away and you should start having normal, complete, full bowel movements.  It may be helpful to start treatment when you can be near the comfort of your own bathroom, such as a weekend.  After you are out we can send in a prescription if you did well, there is a prescription card  Toileting tips to help with your constipation - Drink at least 64-80 ounces of water/liquid per day. - Establish a time to try to move your bowels every day.  For many people, this is after a cup of coffee or after a meal such as breakfast. - Sit all of the way back on the toilet keeping your back fairly straight and while sitting up, try to rest the tops of your forearms on your upper thighs.   - Raising your feet with a step stool/squatty potty can be helpful to improve the angle that allows your stool to pass through the rectum. - Relax the rectum feeling it bulge toward the toilet water.  If you feel your rectum raising toward your body,  you are contracting rather than relaxing. - Breathe in and slowly exhale. Belly breath by expanding your belly towards your belly button. Keep belly expanded as you gently direct pressure down and back to the anus.  A low pitched GRRR sound can assist with increasing intra-abdominal pressure.  (Can also trying to blow on a pinwheel and make it move, this helps with the same belly breathing) - Repeat 3-4 times. If unsuccessful, contract the pelvic floor to restore normal tone and get off the toilet.  Avoid excessive straining. - To reduce excessive wiping by teaching your anus to normally contract, place hands on outer aspect of knees and resist knee movement outward.  Hold 5-10 second then place hands just inside of knees and resist inward movement of knees.  Hold 5 seconds.  Repeat a few times each way.  Go to the ER if unable to pass gas, severe AB pain, unable to hold down food, any shortness of breath of chest pain.   What is EOE? Eosinophilic esophagitis is a chronic (long-lasting) condition where white blood cells called eosinophils build up in the lining of your esophagus (the tube that carries food from your mouth to your stomach). This  build-up causes inflammation, which can make swallowing difficult or uncomfortable. What Causes EoE? The exact cause is not fully known, but it's believed to be an allergic or immune reaction, often triggered by: Certain foods (common: milk, eggs, wheat, soy, nuts, seafood) Environmental allergens (like pollen, dust mites, mold) Genetics (runs in some families)  Common Symptoms Trouble swallowing (especially dry or solid foods) Food getting stuck in your throat Chest pain or heartburn Abdominal pain Nausea or vomiting Poor appetite (especially in children) Slow growth or weight loss (in children)  How Is It Diagnosed? Diagnosis usually includes: Upper endoscopy - A thin tube with a camera is inserted through your mouth to look at your  esophagus. Biopsy - Small tissue samples are taken and examined under a microscope. Allergy  testing - To identify possible food or environmental triggers.   Treatment Options Treatment focuses on reducing inflammation and preventing food from getting stuck:  1. Dietary Therapy Elimination Diet: Remove common trigger foods (usually 6-8 major allergens). Elemental Diet: Uses a special amino acid-based formula. Targeted Elimination: Based on allergy  testing or food history.  2. Medications Swallowed corticosteroids: Like fluticasone  or budesonide to reduce inflammation (not the same as asthma inhalers; the medicine is swallowed, not inhaled). Acid reducers: Like proton pump inhibitors (PPIs) to help with reflux and inflammation.  3. Esophageal Dilation If your esophagus becomes narrowed (a stricture), a doctor may stretch it to help with swallowing.  Long-Term Management EoE is a chronic condition, but symptoms can be managed with: Ongoing treatment Follow-up endoscopies Avoiding trigger foods Working with a gastroenterologist, allergist, and dietitian  Tips for Living with EoE Chew food slowly and thoroughly Keep a food/symptom diary Read food labels carefully Follow your doctor's dietary plan Discuss any swallowing difficulties promptly    FODMAP stands for fermentable oligo-, di-, mono-saccharides and polyols (1). These are the scientific terms used to classify groups of carbs that are difficult for our body to digest and that are notorious for triggering digestive symptoms like bloating, gas, loose stools and stomach pain.   You can try low FODMAP diet  - start with eliminating just one column at a time that you feel may be a trigger for you. - the table at the very bottom contains foods that are low in FODMAPs   Sometimes trying to eliminate the FODMAP's from your diet is difficult or tricky, if you are stuggling with trying to do the elimination diet you can try an  enzyme.  There is a food enzymes that you sprinkle in or on your food that helps break down the FODMAP. You can read more about the enzyme by going to this site: https://fodzyme.com/

## 2023-10-10 ENCOUNTER — Ambulatory Visit: Admitting: Physician Assistant

## 2023-10-13 ENCOUNTER — Ambulatory Visit: Payer: Self-pay | Admitting: Physician Assistant

## 2023-11-07 ENCOUNTER — Telehealth: Payer: Self-pay | Admitting: Pediatrics

## 2023-11-07 NOTE — Telephone Encounter (Signed)
 Good Morning Dr.McGreal,  Patient calling stating she is going to have to cancel upcoming double procedure on 11/12/23 due to fiance having and emergency surgery scheduled and being unable to make her procedure date. Patient would like to reschedule for January to make sure she has the days off from work. Please advise  Thank you

## 2023-11-11 ENCOUNTER — Encounter: Payer: Self-pay | Admitting: Family Medicine

## 2023-11-12 ENCOUNTER — Encounter: Admitting: Pediatrics

## 2023-11-17 ENCOUNTER — Other Ambulatory Visit: Payer: Self-pay | Admitting: Family Medicine

## 2023-11-17 DIAGNOSIS — Z30011 Encounter for initial prescription of contraceptive pills: Secondary | ICD-10-CM

## 2023-11-17 MED ORDER — NORGESTIMATE-ETH ESTRADIOL 0.18/0.215/0.25 MG-25 MCG PO TABS: 1.0000 | ORAL_TABLET | Freq: Every day | ORAL | 1 refills | Status: AC

## 2024-01-14 ENCOUNTER — Encounter: Payer: Self-pay | Admitting: Family Medicine

## 2024-01-19 ENCOUNTER — Other Ambulatory Visit: Payer: Self-pay

## 2024-01-19 ENCOUNTER — Other Ambulatory Visit: Payer: Self-pay | Admitting: Family Medicine

## 2024-01-19 DIAGNOSIS — G4700 Insomnia, unspecified: Secondary | ICD-10-CM

## 2024-01-19 NOTE — Telephone Encounter (Signed)
 Requesting: Ambien  10mg   Contract: 07/11/23 UDS: 07/11/23 Last Visit: 07/11/23 Next Visit: None Last Refill: 07/15/23 #90 and 0RF  Please Advise

## 2024-01-22 ENCOUNTER — Encounter: Payer: Self-pay | Admitting: Allergy and Immunology

## 2024-01-22 ENCOUNTER — Ambulatory Visit: Admitting: Allergy and Immunology

## 2024-01-22 VITALS — BP 150/86 | HR 76 | Temp 98.2°F | Resp 14 | Ht 65.0 in | Wt 175.0 lb

## 2024-01-22 DIAGNOSIS — T7819XD Other adverse food reactions, not elsewhere classified, subsequent encounter: Secondary | ICD-10-CM

## 2024-01-22 DIAGNOSIS — J3089 Other allergic rhinitis: Secondary | ICD-10-CM

## 2024-01-22 DIAGNOSIS — K219 Gastro-esophageal reflux disease without esophagitis: Secondary | ICD-10-CM

## 2024-01-22 DIAGNOSIS — J4540 Moderate persistent asthma, uncomplicated: Secondary | ICD-10-CM

## 2024-01-22 DIAGNOSIS — L2089 Other atopic dermatitis: Secondary | ICD-10-CM | POA: Diagnosis not present

## 2024-01-22 DIAGNOSIS — J301 Allergic rhinitis due to pollen: Secondary | ICD-10-CM | POA: Diagnosis not present

## 2024-01-22 MED ORDER — OMEPRAZOLE 40 MG PO CPDR: DELAYED_RELEASE_CAPSULE | ORAL | 5 refills | Status: AC

## 2024-01-22 MED ORDER — FAMOTIDINE 40 MG PO TABS: 40.0000 mg | ORAL_TABLET | Freq: Every day | ORAL | 5 refills | Status: AC

## 2024-01-22 MED ORDER — EPINEPHRINE 0.3 MG/0.3ML IJ SOAJ: INTRAMUSCULAR | 3 refills | Status: AC

## 2024-01-22 NOTE — Patient Instructions (Addendum)
" °  1. Treat and prevent reflux / LPR:   A. Decrease caffeine consumption  B. Replace throat clearing with drinking / swallowing maneuver  C. Increase omeprazole  40 - 2 times per day  D. Start famotidine  40 mg - in evening  2. Continue to address atopic disease:   A. Dupilumab injections every 2 weeks  B. Advair 250 - 1 inhalation 1-2 times per day  C. Cetirizine , epi-pen, albuterol , triamcinolone   3. Influenza = Tamiflu . Covid = Paxlovid  4. Return to clinic in 4 weeks. Further evaluation and treatment??? "

## 2024-01-22 NOTE — Progress Notes (Unsigned)
 "  [place] - [place] - [place] - [place] - [place]   Follow-up Note  Referring Provider: Antonio Cyndee Jamee JONELLE, DO Primary Provider: Antonio Cyndee, Jamee JONELLE, DO Date of Office Visit: 01/22/2024  Subjective:   Angela Bryant (DOB: 06/27/1980) is a 44 y.o. female who [facility] in re-evaluation of the following:  HPI: Angela Bryant presents to this clinic in evaluation of asthma, allergic rhinitis, atopic dermatitis.  I have never seen her in this clinic and her last visit with Dr. Jeneal was 21 January 2023.  Apparently she has had very good control of her atopic dermatitis with dupilumab prescribed by [facility] dermatology and she rarely uses any topical agents.  And she has had very good control of her asthma while using her Advair just 1 time per day and while doing so rarely uses albuterol  can exercise without any problem and have cold air exposure without any problem.  Likewise, she has had very little problems with her upper airway and occasionally uses a antihistamine.  She has not required a systemic steroid or antibiotic for any type of airway issue.  She has not had any allergic reactions.  She remains away from foods that cause her throat to feel as though it is closing off or get itchy.  This includes tree nuts and bananas and some fresh fruits.  She has an injectable epinephrine  device.  What is bothering her is her throat.  She has constant throat clearing and intermittent raspy voice and feeling as though her throat is closing up and feeling as though there is a coating in her throat.  She does have reflux and she is using omeprazole  40 mg at bedtime which is resulting in very good control of her classic reflux symptoms.  She drinks a coffee every day, sweet tea every day, and has alcohol just on the weekends.  She has seen ENT approximately a year ago who performed rhinoscopy and apparently there is no significant anatomical  abnormality of her throat.  Allergies as of 01/22/2024       Reactions   Codeine Nausea And Vomiting   Omnicef [cefdinir] Hives   Other Other (See Comments)   Ethyl Cyanoacrylate- Positive patch test Paraphenylenediamine- Positive patch test 2- Hydroxyethyl Methacrylate        Medication List    albuterol  108 (90 Base) MCG/ACT inhaler Commonly known as: VENTOLIN  HFA Inhale 2 puffs into the lungs every 6 (six) hours as needed. Reported on 02/01/2015   cetirizine  10 MG tablet Commonly known as: ZYRTEC  Take 1 tablet (10 mg total) by mouth daily.   clobetasol ointment 0.05 % Commonly known as: TEMOVATE Apply topically 2 (two) times daily.   Dupixent 300 MG/2ML prefilled syringe Generic drug: dupilumab Inject 300 mg into the skin every 14 (fourteen) days.   EPINEPHrine  0.3 mg/0.3 mL Soaj injection Commonly known as: EpiPen  2-Pak Inject 0.3 mg into the muscle as needed for anaphylaxis.   fluticasone -salmeterol 250-50 MCG/ACT Aepb Commonly known as: ADVAIR Inhale 1 puff into the lungs in the morning and at bedtime.   hydrOXYzine  10 MG tablet Commonly known as: ATARAX  Take 1 tablet (10 mg total) by mouth 3 (three) times daily as needed for anxiety.   ibuprofen  800 MG tablet Commonly known as: ADVIL  Take 1 tablet (800 mg total) by mouth every 8 (eight) hours as needed.   linaclotide  72 MCG capsule Commonly known as: Linzess  Take 1 capsule (72 mcg total) by mouth  daily before breakfast. What changed: additional instructions   montelukast  10 MG tablet Commonly known as: SINGULAIR  TAKE 1 TABLET AT BEDTIME   Norgestimate -Eth Estradiol  0.18/0.215/0.25 MG-25 MCG Tabs Commonly known as: Ortho Tri-Cyclen Lo Take 1 tablet by mouth daily.   omeprazole  40 MG capsule Commonly known as: PRILOSEC TAKE 1 CAPSULE DAILY   triamcinolone  cream 0.1 % Commonly known as: KENALOG  APPLY TWICE DAILY AS NEEDED TO INVOLVED AREAS. THIS SHOULD BE TARGETED MAINLY TO INVOLVED AREAS. ONLY  USE AS NEEDED. DO NOT USE REGULARLY OR DAILY LIKE A MOISTURIZER. USE FOR PRESCRIBED PURPOSE AND THEN DISCONTINUE WHEN THE PROBLEM RESOLVES   Zepbound  2.5 MG/0.5ML Pen Generic drug: tirzepatide  Inject 2.5 mg into the skin once a week.   zolpidem  10 MG tablet Commonly known as: AMBIEN  TAKE ONE TABLET BY MOUTH AT BEDTIME    Past Medical History:  Diagnosis Date   Allergic rhinitis    Anxiety    Asthma    GERD (gastroesophageal reflux disease)    Insomnia    Irritable bowel syndrome    Oral allergy  syndrome     Past Surgical History:  Procedure Laterality Date   ANAL RECTAL MANOMETRY N/A 01/04/2015   Procedure: ANO RECTAL MANOMETRY;  Surgeon: Gustav Shila GAILS, MD;  Location: [facility];  Service: Endoscopy;  Laterality: N/A;    Review of systems negative except as noted in HPI / PMHx or noted below:  Review of Systems  Constitutional: Negative.   HENT: Negative.    Eyes: Negative.   Respiratory: Negative.    Cardiovascular: Negative.   Gastrointestinal: Negative.   Genitourinary: Negative.   Musculoskeletal: Negative.   Skin: Negative.   Neurological: Negative.   Endo/Heme/Allergies: Negative.   Psychiatric/Behavioral: Negative.       Objective:   Vitals:   01/22/24 1638 01/22/24 1707  BP: (!) 132/94 (!) 150/86  Pulse: 76   Resp: 14   Temp: 98.2 F (36.8 C)   SpO2: 98%    Height: 5' 5 (165.1 cm)  Weight: 175 lb (79.4 kg)   Physical Exam Constitutional:      Appearance: She is not diaphoretic.     Comments: Throat clearing, raspy voice  HENT:     Head: Normocephalic.     Right Ear: Tympanic membrane, ear canal and external ear normal.     Left Ear: Tympanic membrane, ear canal and external ear normal.     Nose: Nose normal. No mucosal edema or rhinorrhea.     Mouth/Throat:     Pharynx: Uvula midline. No oropharyngeal exudate.  Eyes:     Conjunctiva/sclera: Conjunctivae normal.  Neck:     Thyroid : No thyromegaly.     Trachea: Trachea normal.  No tracheal tenderness or tracheal deviation.  Cardiovascular:     Rate and Rhythm: Normal rate and regular rhythm.     Heart sounds: Normal heart sounds, S1 normal and S2 normal. No murmur heard. Pulmonary:     Effort: No respiratory distress.     Breath sounds: Normal breath sounds. No stridor. No wheezing or rales.  Lymphadenopathy:     Head:     Right side of head: No tonsillar adenopathy.     Left side of head: No tonsillar adenopathy.     Cervical: No cervical adenopathy.  Skin:    Findings: No erythema or rash.     Nails: There is no clubbing.  Neurological:     Mental Status: She is alert.     Diagnostics: Spirometry was performed and  demonstrated an FEV1 of 2.65 at 87 % of predicted.  Review of a rhinoscopy performed 27 August 2022 refers to the mucosal lining of the posterior arytenoid region was mildly edematous consistent with laryngopharyngeal reflux.  Assessment and Plan:   1. Asthma, moderate persistent, well-controlled   2. Perennial allergic rhinitis   3. Seasonal allergic rhinitis due to pollen   4. Other atopic dermatitis   5. Oral allergy  syndrome, subsequent encounter   6. LPRD (laryngopharyngeal reflux disease)   7. Gastroesophageal reflux disease    1. Treat and prevent reflux / LPR:   A. Decrease caffeine consumption  B. Replace throat clearing with drinking / swallowing maneuver  C. Increase omeprazole  40 - 2 times per day  D. Start famotidine  40 mg - in evening  2. Continue to address atopic disease:   A. Dupilumab injections every 2 weeks  B. Advair 250 - 1 inhalation 1-2 times per day  C. Cetirizine , epi-pen, albuterol , triamcinolone   3. Influenza = Tamiflu . Covid = Paxlovid  4. Return to clinic in 4 weeks. Further evaluation and treatment???  Tamrah has a history very consistent with LPR and although her atopic disease is under very good control on her current plan which includes dupilumab injections and her classic reflux  symptomatology is under good control with omeprazole  once a day she is having significant inflammation of her larynx secondary to LPR and we are going to treat her aggressively for this condition as noted above for the next 4 weeks.  I will see her back in this clinic at that point in time to consider further evaluation and treatment based upon her response.  Angela Denis, MD Allergy  / Immunology [facility] "

## 2024-01-26 ENCOUNTER — Encounter: Payer: Self-pay | Admitting: Allergy and Immunology

## 2024-02-25 ENCOUNTER — Ambulatory Visit: Admitting: Allergy and Immunology
